# Patient Record
Sex: Male | Born: 1980 | Race: White | Hispanic: No | Smoking: Current every day smoker
Health system: Southern US, Community
[De-identification: ages and names within clinical notes are randomized; demographics above are authoritative.]

## PROBLEM LIST (undated history)

## (undated) DIAGNOSIS — J329 Chronic sinusitis, unspecified: Secondary | ICD-10-CM

## (undated) DIAGNOSIS — B009 Herpesviral infection, unspecified: Secondary | ICD-10-CM

## (undated) DIAGNOSIS — Z8619 Personal history of other infectious and parasitic diseases: Secondary | ICD-10-CM

## (undated) DIAGNOSIS — F419 Anxiety disorder, unspecified: Secondary | ICD-10-CM

## (undated) DIAGNOSIS — B20 Human immunodeficiency virus [HIV] disease: Secondary | ICD-10-CM

## (undated) DIAGNOSIS — F1120 Opioid dependence, uncomplicated: Secondary | ICD-10-CM

## (undated) DIAGNOSIS — F4310 Post-traumatic stress disorder, unspecified: Secondary | ICD-10-CM

## (undated) DIAGNOSIS — R569 Unspecified convulsions: Secondary | ICD-10-CM

## (undated) DIAGNOSIS — M792 Neuralgia and neuritis, unspecified: Secondary | ICD-10-CM

## (undated) DIAGNOSIS — Z9109 Other allergy status, other than to drugs and biological substances: Secondary | ICD-10-CM

## (undated) DIAGNOSIS — I10 Essential (primary) hypertension: Secondary | ICD-10-CM

## (undated) DIAGNOSIS — A549 Gonococcal infection, unspecified: Secondary | ICD-10-CM

## (undated) DIAGNOSIS — F3173 Bipolar disorder, in partial remission, most recent episode manic: Secondary | ICD-10-CM

## (undated) HISTORY — DX: Personal history of other infectious and parasitic diseases: Z86.19

## (undated) HISTORY — PX: CYST EXCISION: SHX5701

## (undated) HISTORY — DX: Bipolar disorder, in partial remission, most recent episode manic: F31.73

## (undated) HISTORY — DX: Chronic sinusitis, unspecified: J32.9

## (undated) HISTORY — DX: Post-traumatic stress disorder, unspecified: F43.10

## (undated) HISTORY — DX: Human immunodeficiency virus (HIV) disease: B20

---
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
## 2003-04-02 ENCOUNTER — Emergency Department (HOSPITAL_COMMUNITY): Admission: EM | Admit: 2003-04-02 | Discharge: 2003-04-02 | Payer: Self-pay | Admitting: Emergency Medicine

## 2003-04-06 ENCOUNTER — Emergency Department (HOSPITAL_COMMUNITY): Admission: EM | Admit: 2003-04-06 | Discharge: 2003-04-06 | Payer: Self-pay | Admitting: Emergency Medicine

## 2003-04-06 ENCOUNTER — Encounter: Payer: Self-pay | Admitting: Emergency Medicine

## 2003-11-09 ENCOUNTER — Emergency Department (HOSPITAL_COMMUNITY): Admission: AD | Admit: 2003-11-09 | Discharge: 2003-11-09 | Payer: Self-pay | Admitting: Family Medicine

## 2004-09-25 ENCOUNTER — Emergency Department (HOSPITAL_COMMUNITY): Admission: EM | Admit: 2004-09-25 | Discharge: 2004-09-25 | Payer: Self-pay | Admitting: Emergency Medicine

## 2005-08-15 ENCOUNTER — Emergency Department (HOSPITAL_COMMUNITY): Admission: EM | Admit: 2005-08-15 | Discharge: 2005-08-16 | Payer: Self-pay | Admitting: Emergency Medicine

## 2005-10-08 ENCOUNTER — Emergency Department (HOSPITAL_COMMUNITY): Admission: EM | Admit: 2005-10-08 | Discharge: 2005-10-08 | Payer: Self-pay | Admitting: Emergency Medicine

## 2005-10-25 ENCOUNTER — Emergency Department (HOSPITAL_COMMUNITY): Admission: EM | Admit: 2005-10-25 | Discharge: 2005-10-25 | Payer: Self-pay | Admitting: Emergency Medicine

## 2006-01-23 ENCOUNTER — Emergency Department (HOSPITAL_COMMUNITY): Admission: EM | Admit: 2006-01-23 | Discharge: 2006-01-23 | Payer: Self-pay | Admitting: Emergency Medicine

## 2006-12-06 ENCOUNTER — Emergency Department (HOSPITAL_COMMUNITY): Admission: EM | Admit: 2006-12-06 | Discharge: 2006-12-06 | Payer: Self-pay | Admitting: Emergency Medicine

## 2006-12-11 HISTORY — PX: ESOPHAGOGASTRODUODENOSCOPY: SHX1529

## 2006-12-24 ENCOUNTER — Ambulatory Visit (HOSPITAL_COMMUNITY): Admission: RE | Admit: 2006-12-24 | Discharge: 2006-12-24 | Payer: Self-pay | Admitting: Family Medicine

## 2006-12-31 ENCOUNTER — Ambulatory Visit (HOSPITAL_COMMUNITY): Admission: RE | Admit: 2006-12-31 | Discharge: 2006-12-31 | Payer: Self-pay | Admitting: Family Medicine

## 2007-01-30 ENCOUNTER — Ambulatory Visit: Payer: Self-pay | Admitting: Gastroenterology

## 2007-02-06 ENCOUNTER — Encounter (INDEPENDENT_AMBULATORY_CARE_PROVIDER_SITE_OTHER): Payer: Self-pay | Admitting: Specialist

## 2007-02-06 ENCOUNTER — Ambulatory Visit: Payer: Self-pay | Admitting: Gastroenterology

## 2007-02-06 ENCOUNTER — Ambulatory Visit (HOSPITAL_COMMUNITY): Admission: RE | Admit: 2007-02-06 | Discharge: 2007-02-06 | Payer: Self-pay | Admitting: Gastroenterology

## 2007-03-31 ENCOUNTER — Inpatient Hospital Stay (HOSPITAL_COMMUNITY): Admission: EM | Admit: 2007-03-31 | Discharge: 2007-04-01 | Payer: Self-pay | Admitting: Emergency Medicine

## 2007-05-01 ENCOUNTER — Emergency Department (HOSPITAL_COMMUNITY): Admission: EM | Admit: 2007-05-01 | Discharge: 2007-05-02 | Payer: Self-pay | Admitting: Emergency Medicine

## 2011-01-03 ENCOUNTER — Ambulatory Visit: Admit: 2011-01-03 | Payer: Self-pay | Admitting: Gastroenterology

## 2011-04-28 NOTE — H&P (Signed)
NAME:  OTHAL, KUBITZ             ACCOUNT NO.:  [id number]   MEDICAL RECORD NO.:  [id number]          PATIENT TYPE:  INP   LOCATION:  3731                         FACILITY:  [facility]   PHYSICIAN:  Hettie Holstein, D.O.    DATE OF BIRTH:  03/04/1981   DATE OF ADMISSION:  03/31/2007  DATE OF DISCHARGE:                              HISTORY & PHYSICAL   ADDENDUM:  In addition, it is felt that perhaps [name]'s symptoms  may be attributable to his multiple pain and anxiolytic medications, as  well as Soma, although he states he has been on these for long periods  of time and does not attribute these to CNS depression that may in part  explain these symptoms.  Will consider tapering his Tresa Garter, as this  medication has the potential for causing these symptoms.      Hettie Holstein, D.O.  Electronically Signed     ESS/MEDQ  D:  03/31/2007  T:  03/31/2007  Job:  [id number]   cc:   Patrica [name], M.D.
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
## 2011-04-28 NOTE — Consult Note (Signed)
NAME:  ARHAN, MCMANAMON             ACCOUNT NO.:  [id number]   MEDICAL RECORD NO.:  [id number]          PATIENT TYPE:  AMB   LOCATION:                                FACILITY:  [facility]   PHYSICIAN:  Kassie Mends, M.D.      DATE OF BIRTH:  1981-08-20   DATE OF CONSULTATION:  01/30/2007  DATE OF DISCHARGE:                                 CONSULTATION   REFERRING PHYSICIAN:  Patrica [name], MD.   REASON FOR CONSULTATION:  Diarrhea, abdominal cramps and vomiting.   HISTORY OF PRESENT ILLNESS:  [name] is a 30 year old male who  complains of vomiting every morning for years.  He has had diarrhea for  3 months.  He has had abdominal pain for 3 months.  He denies any known  triggers.  He denies any travel or antibiotics.  He states that Dr.  Nobie Putnam asked him for stool samples but he was unable to give him any  because the collection of the stool samples made him vomit.  He does  have well water but he does not drink it because it gives him heartburn.  He has heartburn and indigestion on a daily basis multiple times a day.  He wakes up 3-4 times a week at night time with indigestion.  When he  vomits, he vomits clear liquid.  It is not food.  He states that on a  good day he has 2 bowel movements a day.  On a bad day, he has 5 bowel  movements a day.  His number of bowel movements is influenced by the  number of times he eats.  His bowel movements are the consistency of  sausage like to soft blobs to fluffy pieces to watery.  The majority of  his stools, when he does have liquid stool, are watery.  He can go up to  4 times in 1 hour when he gets out of bed.  When he eats, he will have a  bowel movement approximately 30 minutes later.  He states food goes  right through him since the age of 69.  He denies having any preceding  viral illnesses.  No particular foods trigger his symptoms.  He denies  any consumption of soda.  He does drink 36 ounces of Splenda sweet tea  every day.  Denies any  consumption of sugar-free candy or gum.  He does  not eat fat-free chips.  Does not drink flavored water.  He sees blood  in his stool once a month.  He does have anal sex.  His last anal  intercourse was approximately 4-5 months ago.  He is under a lot of  stress.   His abdominal pain is every day.  It is a soreness, crampy and shooting  pain that involves his entire abdomen.  It does wake him up in the  middle of the night.  It eases up throughout the day.  The Bentyl helps.  Hydrocodone helps.  He states he would not have solid stools if it were  not for the Bentyl.  His pain is in the front of  his abdomen.  He feels  hot but has not had any documented fevers.  His abdominal pain does not  keep him awake at night.   PAST MEDICAL HISTORY:  1. Genital herpes.  2. Anxiety.  3. Degenerative disc disease in his neck.  4. Degenerative disc disease in his back.   PAST SURGICAL HISTORY:  Left eye surgery.   ALLERGIES:  No known drug allergies.   MEDICATIONS:  1. Bentyl 10 mg q.i.d.  2. Xanax 0.5 mg q.i.d.  3. Soma 325 mg q.i.d.  4. Hydrocodone 5/500 q.i.d.  5. The patient denies any over-the-counter medicines or herbal      supplements.   FAMILY HISTORY:  He does not know his father's side of the family  history.  He denies any known history of colon cancer or colon polyps.  He has four sisters who are younger than he is.   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  He smokes a  half a pack of cigarettes a day.  He occasionally has a mixed drink.   REVIEW OF SYSTEMS:  He denies any recreational drug use.  His review of  systems per the HPI.  Otherwise all systems are negative.   PHYSICAL EXAMINATION:  Weight 145 pounds.  Height 5 foot 10 inches.  BMI  20.8 (healthy).  Temperature 98.3, blood pressure 120/70, pulse 84.  GENERAL:  He is in no apparent distress.  Alert and oriented x4.  HEENT:  Normocephalic and atraumatic.  Pupils are equal, round, and reactive  to  light.  Mouth has no oral lesions.  Posterior pharynx without erythema  or exudate.  His neck has full range of motion.  No lymphadenopathy.  LUNGS:  Clear to auscultation bilaterally. CARDIOVASCULAR:  Exam has a  regular rhythm.  No murmur.  Normal S1-S2.  ABDOMEN:  Bowel sounds are  present.  Soft, nondistended.  Mild tenderness to palpation in the right  upper quadrant, left lower quadrant with moderate tenderness to  palpation in the left upper quadrant and right lower quadrant without  rebound or guarding.  Unable to appreciate hepatosplenomegaly.  No  abdominal bruits.  No pulsatile masses.  EXTREMITIES:  Without clubbing,  cyanosis, or edema.  NEUROLOGICAL:  He has no focal neurologic deficits.   ASSESSMENT:  [name] is a 30 year old male with early morning  vomiting and daily persistent heartburn and indigestion which is likely  secondary to untreated gastroesophageal reflux disease.  His abdominal  pain and diarrhea without weight loss is likely secondary to a  functional gut disorder.  The differential diagnosis includes celiac  sprue and a low likelihood of Giardia, Cryptosporidia or inflammatory  bowel disease.   Thank you for allowing me to see [name] in consultation.  My  recommendations to follow.   RECOMMENDATIONS:  1. I have given [name] a prescription for Prevacid and asked him      to take it 30 minutes before bedtime.  He is asked to continue the      Bentyl.  2. He is asked to begin a lactose-free diet.  He was given a handout      on a lactose-free diet.  3. I will schedule an EGD and will biopsy for celiac sprue.  Once his      biopsy returns, if they are not positive, then I will begin a fiber      supplement and a high fiber diet.  4. I will also draw an  HIV antibody and a CBC with differential.  I      will also obtain labs drawn from Dr. Geanie Logan office within the      last month. 5. He has a return patient visit to see me in 6  weeks.      Kassie Mends, M.D.  Electronically Signed     SM/MEDQ  D:  01/30/2007  T:  01/31/2007  Job:  [id number]   cc:   Patrica [name], M.D.  Fax: [phone]

## 2011-04-28 NOTE — Op Note (Signed)
NAME:  Randy Castillo, Randy Castillo             ACCOUNT NO.:  [id number]   MEDICAL RECORD NO.:  [id number]          PATIENT TYPE:  AMB   LOCATION:  DAY                           FACILITY:  [facility]   PHYSICIAN:  Kassie Mends, M.D.      DATE OF BIRTH:  1981/02/20   DATE OF PROCEDURE:  02/06/2007  DATE OF DISCHARGE:                                PROCEDURE NOTE   PROCEDURE:  Esophagogastroduodenoscopy with cold forceps biopsy.   INDICATION FOR EXAM:  [name] is a 30 year old male with early  morning vomiting and chronic abdominal pain and watery stool.   FINDINGS:  1. Erosive esophagitis without ulceration in the distal esophagus.  No      stricture or mass.  2. Patchy erythema in the body and antrum of the stomach consistent      with gastritis.  Biopsies obtained to evaluate for H. pylori      infection and eosinophilic gastritis.  3. Normal duodenal bulb in the second portion of the duodenum.      Biopsies obtained to evaluate for celiac sprue.   RECOMMENDATIONS:  1. Resume previous diet but avoid gastric irritants.  The patient was      given a handout on gastric irritants, reflux, and gastritis.  2. I will call Randy Castillo with the results of his biopsy.  He should      continue his Prevacid once daily.  He has a followup appointment to      see me in 4-6 weeks.  3. He is to avoid aspirin and anti-inflammatory drugs for the next 14      days.   MEDICATIONS:  1. Demerol 150 mg IV.  2. Versed 10 mg IV.  3. Phenergan 50 mg IV.   PROCEDURE TECHNIQUE:  Physical exam was performed and informed consent  was obtained from the patient after explaining the benefits, risks, and  alternatives to the procedure.  The patient was connected to the monitor  and placed in the left lateral position.  Continuous oxygen was provided  by nasal cannula and IV medicine administered through and indwelling  cannula.  After administration of sedation, the patient's esophagus was  intubated and the  scope was  advanced under direct visualization to the second portion of  the duodenum.  The scope was subsequently moved slowly back with careful  exam of the color, texture, anatomy and integrity of the mucosa on the  way out.  The patient was recovered in the Endoscopy Suite and  discharged to home in satisfactory condition.      Kassie Mends, M.D.  Electronically Signed     SM/MEDQ  D:  02/06/2007  T:  02/06/2007  Job:  [id number]   cc:   Patrica [name], M.D.  Fax: [phone]

## 2011-08-17 ENCOUNTER — Other Ambulatory Visit (HOSPITAL_COMMUNITY): Payer: Self-pay | Admitting: Family Medicine

## 2011-08-18 ENCOUNTER — Ambulatory Visit (HOSPITAL_COMMUNITY)
Admission: RE | Admit: 2011-08-18 | Discharge: 2011-08-18 | Disposition: A | Discharge status: Home / Self Care | Payer: Self-pay | Source: Ambulatory Visit | Attending: Family Medicine | Admitting: Family Medicine

## 2011-08-18 DIAGNOSIS — R109 Unspecified abdominal pain: Secondary | ICD-10-CM | POA: Insufficient documentation

## 2011-09-02 ENCOUNTER — Emergency Department (HOSPITAL_COMMUNITY)
Admission: EM | Admit: 2011-09-02 | Discharge: 2011-09-02 | Disposition: A | Discharge status: Home / Self Care | Payer: Medicaid Other | Attending: Emergency Medicine | Admitting: Emergency Medicine

## 2011-09-02 DIAGNOSIS — J45909 Unspecified asthma, uncomplicated: Secondary | ICD-10-CM | POA: Insufficient documentation

## 2011-09-02 DIAGNOSIS — IMO0002 Reserved for concepts with insufficient information to code with codable children: Secondary | ICD-10-CM | POA: Insufficient documentation

## 2011-09-02 DIAGNOSIS — M792 Neuralgia and neuritis, unspecified: Secondary | ICD-10-CM

## 2011-09-02 DIAGNOSIS — B009 Herpesviral infection, unspecified: Secondary | ICD-10-CM | POA: Insufficient documentation

## 2011-09-02 DIAGNOSIS — F17200 Nicotine dependence, unspecified, uncomplicated: Secondary | ICD-10-CM | POA: Insufficient documentation

## 2011-09-02 HISTORY — DX: Gonococcal infection, unspecified: A54.9

## 2011-09-02 HISTORY — DX: Herpesviral infection, unspecified: B00.9

## 2011-09-02 NOTE — ED Notes (Signed)
Pt presents with low back/spinal pain that radiates down bilateral legs. Pt states he also feels pain in Quads and states he feels a tingling pain in arms, head, and "scalp". Pt states he cant sleep or work and is under the care of NP at the health dept.

## 2011-09-02 NOTE — ED Provider Notes (Signed)
History     CSN: [id number] Arrival date & time: 09/02/2011  3:11 PM  Chief Complaint  Patient presents with  . Back Pain  . Leg Pain    HPI  (Consider location/radiation/quality/duration/timing/severity/associated sxs/prior treatment)  Patient is a 30 y.o. male presenting with back pain and leg pain. The history is provided by the patient.  Back Pain  This is a chronic problem. The problem occurs daily. The pain is associated with no known injury (hx of post herpetic neuralgia). The pain is present in the lumbar spine. Quality: Dull and constant. The pain radiates to the left thigh and right thigh. The pain is severe. The symptoms are aggravated by certain positions. Stiffness is present all day. Associated symptoms include numbness, abdominal pain and leg pain. Pertinent negatives include no chest pain, no fever, no perianal numbness, no bladder incontinence and no dysuria. Associated symptoms comments: Tingling of the hands and arms for 1 month.. He has tried nothing for the symptoms.  Leg Pain  Associated symptoms include numbness.    Past Medical History  Diagnosis Date  . Herpes   . Gonorrhea in male   . Asthma     History reviewed. No pertinent past surgical history.  History reviewed. No pertinent family history.  History  Substance Use Topics  . Smoking status: Current Everyday Smoker -- 1.0 packs/day  . Smokeless tobacco: Not on file  . Alcohol Use: No      Review of Systems  Review of Systems  Constitutional: Negative for fever and activity change.       All ROS Neg except as noted in HPI  HENT: Negative for nosebleeds and neck pain.   Eyes: Negative for photophobia and discharge.  Respiratory: Negative for cough, shortness of breath and wheezing.   Cardiovascular: Negative for chest pain and palpitations.  Gastrointestinal: Positive for abdominal pain. Negative for blood in stool.  Genitourinary: Negative for bladder incontinence, dysuria, frequency and  hematuria.  Musculoskeletal: Positive for back pain. Negative for arthralgias.  Skin: Negative.   Neurological: Positive for numbness. Negative for dizziness, seizures and speech difficulty.  Psychiatric/Behavioral: Negative for hallucinations and confusion.    Allergies  Darvocet  Home Medications   Current Outpatient Rx  Name Route Sig Dispense Refill  . ACYCLOVIR 400 MG PO TABS Oral Take 400 mg by mouth 2 (two) times daily.      . AMPHETAMINE-DEXTROAMPHETAMINE 15 MG PO TABS Oral Take 15 mg by mouth 2 (two) times daily.      Marland Kitchen GABAPENTIN 300 MG PO CAPS Oral Take 300 mg by mouth 3 (three) times daily.        Physical Exam    BP 128/89  Pulse 61  Temp(Src) 98.4 F (36.9 C) (Oral)  Resp 20  Ht 5\' 10"  (1.778 m)  Wt 145 lb (65.772 kg)  BMI 20.81 kg/m2  SpO2 100%  Physical Exam  Nursing note and vitals reviewed. Constitutional: He is oriented to person, place, and time. He appears well-developed and well-nourished.  Non-toxic appearance.  HENT:  Head: Normocephalic.  Right Ear: Tympanic membrane and external ear normal.  Left Ear: Tympanic membrane and external ear normal.  Eyes: EOM and lids are normal. Pupils are equal, round, and reactive to light.  Neck: Normal range of motion. Neck supple. Carotid bruit is not present.  Cardiovascular: Normal rate, regular rhythm, normal heart sounds, intact distal pulses and normal pulses.   Pulmonary/Chest: Breath sounds normal. No respiratory distress.  Abdominal: Soft. Bowel sounds are  normal. There is no tenderness. There is no guarding.  Musculoskeletal: Normal range of motion.  Lymphadenopathy:       Head (right side): No submandibular adenopathy present.       Head (left side): No submandibular adenopathy present.    He has no cervical adenopathy.  Neurological: He is alert and oriented to person, place, and time. He has normal strength. No cranial nerve deficit or sensory deficit. He exhibits normal muscle tone. Coordination  normal.       Pain to palpation of the upper extremities and the lower ext.(not new). No gross neuro deficits.   Skin: Skin is warm and dry.  Psychiatric: His speech is normal.    ED Course: Discussed the course of this chronic illness. Suggested he discuss this with one of the clinics and [facility] or [facility].  Procedures (including critical care time)  Labs Reviewed - No data to display US Abdomen Complete  08/18/2011  *RADIOLOGY REPORT*  Clinical Data:  Abdominal pain  COMPLETE ABDOMINAL ULTRASOUND  Comparison:  None.  Findings:  Gallbladder:  No gallstones, gallbladder wall thickening, or pericholecystic fluid.  Common bile duct:  Normal at 4 mm  Liver:  There is small echogenic focus measuring 6 mm within the right hepatic lobe.  This is most likely a small hemangioma.  IVC:  Appears normal.  Pancreas:  No focal abnormality seen.  Spleen:  Normal size and echogenicity.  Right Kidney:  10.6cm in length.  No evidence of hydronephrosis or stones.  Left Kidney:  10.9cm in length.  No evidence of hydronephrosis or stones.  Abdominal aorta:  No aneurysm identified.  IMPRESSION:  1.  No acute findings by ultrasound.  No explanation for abdominal pain. 2.  Normal gallbladder. 3.  Small echogenic focus within the liver is incidental finding. This likely represents a hemangioma.  In the absence of hepatocellur disease, know malignancy and with normal liver function, no specific follow-up is recommended.  If any risk factors are present, consider MRI abdomen with contrast.  Original Report Authenticated By: Genevive Bi, M.D.     Dx: Neuralgia of upper and lower extremities  MDM I have reviewed nursing notes, vital signs, and all appropriate lab and imaging results for this patient.        Kathie Dike, [place] 09/02/11 [phone]

## 2011-09-04 NOTE — ED Provider Notes (Signed)
Medical screening examination/treatment/procedure(s) were performed by non-physician practitioner and as supervising physician I was immediately available for consultation/collaboration.   Benny Lennert, MD 09/04/11 1435

## 2011-09-29 DIAGNOSIS — R202 Paresthesia of skin: Secondary | ICD-10-CM | POA: Insufficient documentation

## 2011-10-30 DIAGNOSIS — B009 Herpesviral infection, unspecified: Secondary | ICD-10-CM | POA: Insufficient documentation

## 2012-03-18 ENCOUNTER — Other Ambulatory Visit: Payer: Self-pay

## 2012-03-18 ENCOUNTER — Emergency Department (HOSPITAL_COMMUNITY): Payer: Medicaid Other

## 2012-03-18 ENCOUNTER — Encounter (HOSPITAL_COMMUNITY): Payer: Self-pay | Admitting: *Deleted

## 2012-03-18 ENCOUNTER — Emergency Department (HOSPITAL_COMMUNITY)
Admission: EM | Admit: 2012-03-18 | Discharge: 2012-03-18 | Disposition: A | Discharge status: Home / Self Care | Payer: Medicaid Other | Attending: Emergency Medicine | Admitting: Emergency Medicine

## 2012-03-18 DIAGNOSIS — R079 Chest pain, unspecified: Secondary | ICD-10-CM | POA: Insufficient documentation

## 2012-03-18 DIAGNOSIS — R6883 Chills (without fever): Secondary | ICD-10-CM | POA: Insufficient documentation

## 2012-03-18 DIAGNOSIS — F329 Major depressive disorder, single episode, unspecified: Secondary | ICD-10-CM | POA: Insufficient documentation

## 2012-03-18 DIAGNOSIS — I10 Essential (primary) hypertension: Secondary | ICD-10-CM | POA: Insufficient documentation

## 2012-03-18 DIAGNOSIS — J45909 Unspecified asthma, uncomplicated: Secondary | ICD-10-CM | POA: Insufficient documentation

## 2012-03-18 DIAGNOSIS — Z79899 Other long term (current) drug therapy: Secondary | ICD-10-CM | POA: Insufficient documentation

## 2012-03-18 DIAGNOSIS — M549 Dorsalgia, unspecified: Secondary | ICD-10-CM | POA: Insufficient documentation

## 2012-03-18 DIAGNOSIS — M79609 Pain in unspecified limb: Secondary | ICD-10-CM | POA: Insufficient documentation

## 2012-03-18 DIAGNOSIS — R0602 Shortness of breath: Secondary | ICD-10-CM | POA: Insufficient documentation

## 2012-03-18 DIAGNOSIS — R51 Headache: Secondary | ICD-10-CM | POA: Insufficient documentation

## 2012-03-18 DIAGNOSIS — F3289 Other specified depressive episodes: Secondary | ICD-10-CM | POA: Insufficient documentation

## 2012-03-18 DIAGNOSIS — G40909 Epilepsy, unspecified, not intractable, without status epilepticus: Secondary | ICD-10-CM | POA: Insufficient documentation

## 2012-03-18 DIAGNOSIS — R002 Palpitations: Secondary | ICD-10-CM | POA: Insufficient documentation

## 2012-03-18 HISTORY — DX: Unspecified convulsions: R56.9

## 2012-03-18 HISTORY — DX: Other allergy status, other than to drugs and biological substances: Z91.09

## 2012-03-18 HISTORY — DX: Essential (primary) hypertension: I10

## 2012-03-18 LAB — CBC
Hemoglobin: 15.8 g/dL (ref 13.0–17.0)
MCHC: 34.6 g/dL (ref 30.0–36.0)
Platelets: 259 10*3/uL (ref 150–400)
RBC: 5.07 MIL/uL (ref 4.22–5.81)

## 2012-03-18 LAB — TROPONIN I: Troponin I: <0.3 ng/mL (ref <0.30)

## 2012-03-18 LAB — BASIC METABOLIC PANEL
BUN: 17 mg/dL (ref 6–23)
Chloride: 101 mEq/L (ref 96–112)
GFR calc Af Amer: >90 mL/min (ref >90)
Potassium: 3.2 mEq/L — ABNORMAL LOW (ref 3.5–5.1)
Sodium: 137 mEq/L (ref 135–145)

## 2012-03-18 LAB — D-DIMER, QUANTITATIVE: D-Dimer, Quant: <0.22 ug/mL-FEU (ref 0.00–0.48)

## 2012-03-18 LAB — DIFFERENTIAL
Basophils Relative: 0 % (ref 0–1)
Monocytes Relative: 6 % (ref 3–12)
Neutro Abs: 4.7 10*3/uL (ref 1.7–7.7)
Neutrophils Relative %: 56 % (ref 43–77)

## 2012-03-18 MED ORDER — HYDROMORPHONE HCL PF 1 MG/ML IJ SOLN
1.0000 mg | Freq: Once | INTRAMUSCULAR | Status: AC
  Administered 2012-03-18: 1 mg via INTRAVENOUS
  Filled 2012-03-18: qty 1

## 2012-03-18 MED ORDER — SODIUM CHLORIDE 0.9 % IV SOLN: INTRAVENOUS | Status: DC

## 2012-03-18 MED ORDER — SODIUM CHLORIDE 0.9 % IV BOLUS (SEPSIS)
250.0000 mL | Freq: Once | INTRAVENOUS | Status: AC
  Administered 2012-03-18: 250 mL via INTRAVENOUS

## 2012-03-18 MED ORDER — ONDANSETRON HCL 4 MG/2ML IJ SOLN
4.0000 mg | Freq: Once | INTRAMUSCULAR | Status: AC
  Administered 2012-03-18: 4 mg via INTRAVENOUS
  Filled 2012-03-18: qty 2

## 2012-03-18 NOTE — ED Provider Notes (Addendum)
History    This chart was scribed for Randy Jakes, MD, MD by Smitty Pluck. The patient was seen in room APA05 and the patient's care was started at 2:28PM.   CSN: [id number]  Arrival date & time 03/18/12  1245   First MD Initiated Contact with Patient 03/18/12 1308      Chief Complaint  Patient presents with  . Chest Pain    (Consider location/radiation/quality/duration/timing/severity/associated sxs/prior treatment) Patient is a 31 y.o. male presenting with chest pain. The history is provided by the patient.  Chest Pain    Randy Castillo is a 31 y.o. male who presents to the Emergency Department complaining of moderate substernal chest pain onset 5 days ago. Pain is rated 7/10 currently. He describes the pain as "pressure." Denies nausea and vomiting. Reports having SOB. He reports having chills. Denies fever, sore throat, dysuria, rash and swelling in legs. He always back pain.  Pain is constant since onset with radiation to arms, back and head. Pt reports having HTN and hx of elevated heart rate. Last time he say PCP was 3 weeks ago and they have discussed further cardiology studies. He has hx of anxiety and he has recently moved to new home.  PCP Dr. Venetia Night    Past Medical History  Diagnosis Date  . Herpes   . Gonorrhea in male   . Asthma   . Environmental allergies   . Depression   . Migraine   . Hypertension   . Seizures     History reviewed. No pertinent past surgical history.  History reviewed. No pertinent family history.  History  Substance Use Topics  . Smoking status: Current Everyday Smoker -- 1.0 packs/day  . Smokeless tobacco: Not on file  . Alcohol Use: No      Review of Systems  Cardiovascular: Positive for chest pain.  All other systems reviewed and are negative.   10 Systems reviewed and are negative for acute change except as noted in the HPI.  Allergies  Darvocet  Home Medications   Current Outpatient Rx  Name Route Sig  Dispense Refill  . ACYCLOVIR 400 MG PO TABS Oral Take 400 mg by mouth 3 (three) times daily.     . BUPROPION HCL ER (XL) 300 MG PO TB24 Oral Take 300 mg by mouth 2 (two) times daily.    Marland Kitchen BUTALBITAL-APAP-CAFFEINE 50-325-40 MG PO TABS Oral Take 2 tablets by mouth daily as needed. migraines    . GABAPENTIN 300 MG PO CAPS Oral Take 600 mg by mouth 3 (three) times daily.    Marland Kitchen HYDROCHLOROTHIAZIDE 25 MG PO TABS Oral Take 12.5 mg by mouth daily.    Marland Kitchen OMEPRAZOLE 20 MG PO CPDR Oral Take 20 mg by mouth 2 (two) times daily.    . TOPIRAMATE 100 MG PO TABS Oral Take 100 mg by mouth 2 (two) times daily.      BP 128/84  Pulse 110  Temp(Src) 98.8 F (37.1 C) (Oral)  Resp 20  Ht 5\' 10"  (1.778 m)  Wt 155 lb (70.308 kg)  BMI 22.24 kg/m2  SpO2 100%  Physical Exam  Nursing note and vitals reviewed. Constitutional: He is oriented to person, place, and time. He appears well-developed and well-nourished. No distress.  HENT:  Head: Normocephalic and atraumatic.  Mouth/Throat: Oropharynx is clear and moist.  Eyes: Conjunctivae are normal. Pupils are equal, round, and reactive to light.  Cardiovascular: Normal rate, regular rhythm and normal heart sounds.   No murmur heard.  Pulmonary/Chest: Effort normal and breath sounds normal. No respiratory distress.  Abdominal: Soft. Bowel sounds are normal.  Musculoskeletal: Normal range of motion. He exhibits no edema.  Neurological: He is alert and oriented to person, place, and time. Coordination normal.  Psychiatric: He has a normal mood and affect. His behavior is normal.    ED Course  Procedures (including critical care time) DIAGNOSTIC STUDIES: Oxygen Saturation is 100% on room air, normal by my interpretation.    COORDINATION OF CARE:    Labs Reviewed  BASIC METABOLIC PANEL - Abnormal; Notable for the following:    Potassium 3.2 (*)    Glucose, Bld 111 (*)    All other components within normal limits  TROPONIN I  D-DIMER, QUANTITATIVE  CBC    DIFFERENTIAL   Dg Chest 2 View  03/18/2012  *RADIOLOGY REPORT*  Clinical Data: Chest pain  CHEST - 2 VIEW  Comparison:  03/31/2007  Findings:  The heart size and mediastinal contours are within normal limits.  Both lungs are clear.  The visualized skeletal structures are unremarkable.  IMPRESSION: No active cardiopulmonary disease.  Original Report Authenticated By: Judie Petit. Ruel Favors, M.D.   Results for orders placed during the hospital encounter of 03/18/12  TROPONIN I      Component Value Range   Troponin I <0.30  <0.30 (ng/mL)  D-DIMER, QUANTITATIVE      Component Value Range   D-Dimer, Quant <0.22  0.00 - 0.48 (ug/mL-FEU)  CBC      Component Value Range   WBC 8.4  4.0 - 10.5 (K/uL)   RBC 5.07  4.22 - 5.81 (MIL/uL)   Hemoglobin 15.8  13.0 - 17.0 (g/dL)   HCT 16.1  09.6 - 04.5 (%)   MCV 89.9  78.0 - 100.0 (fL)   MCH 31.2  26.0 - 34.0 (pg)   MCHC 34.6  30.0 - 36.0 (g/dL)   RDW 40.9  81.1 - 91.4 (%)   Platelets 259  150 - 400 (K/uL)  DIFFERENTIAL      Component Value Range   Neutrophils Relative 56  43 - 77 (%)   Neutro Abs 4.7  1.7 - 7.7 (K/uL)   Lymphocytes Relative 37  12 - 46 (%)   Lymphs Abs 3.2  0.7 - 4.0 (K/uL)   Monocytes Relative 6  3 - 12 (%)   Monocytes Absolute 0.5  0.1 - 1.0 (K/uL)   Eosinophils Relative 1  0 - 5 (%)   Eosinophils Absolute 0.1  0.0 - 0.7 (K/uL)   Basophils Relative 0  0 - 1 (%)   Basophils Absolute 0.0  0.0 - 0.1 (K/uL)  BASIC METABOLIC PANEL      Component Value Range   Sodium 137  135 - 145 (mEq/L)   Potassium 3.2 (*) 3.5 - 5.1 (mEq/L)   Chloride 101  96 - 112 (mEq/L)   CO2 25  19 - 32 (mEq/L)   Glucose, Bld 111 (*) 70 - 99 (mg/dL)   BUN 17  6 - 23 (mg/dL)   Creatinine, Ser 7.82  0.50 - 1.35 (mg/dL)   Calcium 9.8  8.4 - 95.6 (mg/dL)   GFR calc non Af Amer >90  >90 (mL/min)   GFR calc Af Amer >90  >90 (mL/min)    Date: 03/18/2012  Rate: 106  Rhythm: sinus tachycardia  QRS Axis: normal  Intervals: normal  ST/T Wave abnormalities:  nonspecific ST changes  Conduction Disutrbances:none  Narrative Interpretation:   Old EKG Reviewed: none available  1. Chest pain   2. Heart palpitations       MDM  Patient with chest pain for 5 days no evidence of acute cardiac event troponin normal EKG without acute changes. Also d-dimer screening negative no evidence of pulmonary embolism chest x-ray without any significant abnormalities no pneumonia no pneumothorax.  Patient also a history of palpitations and fast heart rate no evidence that occurring here while monitored in the emergency department patient will need followup with her primary care provider and recommend cardiology referral for Holter monitoring. Patient is safe and stable to return home.    I personally performed the services described in this documentation, which was scribed in my presence. The recorded information has been reviewed and considered.          Randy Jakes, MD 03/18/12 1719  Randy Jakes, MD 03/18/12 [phone]

## 2012-03-18 NOTE — Discharge Instructions (Signed)
Workup in the emergency room and negative for acute cardiac event for pneumonia or 4 evidence of pulmonary embolus. Call cardiology for appointment with palpitations. Follow up with your primary care doctor in the next few days. Return for new or worse symptoms return for heart palpitations lasting for more than 45 minutes. Start one baby aspirin a day.

## 2012-03-18 NOTE — ED Notes (Signed)
Went to assess pt and pt found to be tearful. Pt reports he is afraid and is tired of hurting. Partner at bedside. States headache has not improved. Will make MD aware.

## 2012-03-18 NOTE — ED Notes (Addendum)
Chest pain for 5 days with rapid heart rate..Feels dizzy at times.

## 2012-03-18 NOTE — ED Notes (Signed)
Patient transported to X-ray 

## 2012-03-18 NOTE — ED Notes (Signed)
Pt tearful. States his heart hurts. States it has been hurting x 5 days. States he is fine when he wakes up, but if he moves, gets anxious or does anything it starts hurting

## 2012-03-18 NOTE — ED Notes (Signed)
Pt waiting to be reeval and disposition 

## 2012-04-08 ENCOUNTER — Emergency Department (HOSPITAL_COMMUNITY): Payer: Medicaid Other

## 2012-04-08 ENCOUNTER — Emergency Department (HOSPITAL_COMMUNITY)
Admission: EM | Admit: 2012-04-08 | Discharge: 2012-04-09 | Disposition: A | Discharge status: Home / Self Care | Payer: Medicaid Other | Attending: Emergency Medicine | Admitting: Emergency Medicine

## 2012-04-08 ENCOUNTER — Encounter (HOSPITAL_COMMUNITY): Payer: Self-pay | Admitting: *Deleted

## 2012-04-08 DIAGNOSIS — IMO0001 Reserved for inherently not codable concepts without codable children: Secondary | ICD-10-CM | POA: Insufficient documentation

## 2012-04-08 DIAGNOSIS — R51 Headache: Secondary | ICD-10-CM | POA: Insufficient documentation

## 2012-04-08 DIAGNOSIS — R079 Chest pain, unspecified: Secondary | ICD-10-CM | POA: Insufficient documentation

## 2012-04-08 DIAGNOSIS — S161XXA Strain of muscle, fascia and tendon at neck level, initial encounter: Secondary | ICD-10-CM

## 2012-04-08 DIAGNOSIS — F329 Major depressive disorder, single episode, unspecified: Secondary | ICD-10-CM | POA: Insufficient documentation

## 2012-04-08 DIAGNOSIS — M545 Low back pain, unspecified: Secondary | ICD-10-CM | POA: Insufficient documentation

## 2012-04-08 DIAGNOSIS — I10 Essential (primary) hypertension: Secondary | ICD-10-CM | POA: Insufficient documentation

## 2012-04-08 DIAGNOSIS — S335XXA Sprain of ligaments of lumbar spine, initial encounter: Secondary | ICD-10-CM | POA: Insufficient documentation

## 2012-04-08 DIAGNOSIS — Z79899 Other long term (current) drug therapy: Secondary | ICD-10-CM | POA: Insufficient documentation

## 2012-04-08 DIAGNOSIS — G40909 Epilepsy, unspecified, not intractable, without status epilepticus: Secondary | ICD-10-CM | POA: Insufficient documentation

## 2012-04-08 DIAGNOSIS — J45909 Unspecified asthma, uncomplicated: Secondary | ICD-10-CM | POA: Insufficient documentation

## 2012-04-08 DIAGNOSIS — S139XXA Sprain of joints and ligaments of unspecified parts of neck, initial encounter: Secondary | ICD-10-CM | POA: Insufficient documentation

## 2012-04-08 DIAGNOSIS — F3289 Other specified depressive episodes: Secondary | ICD-10-CM | POA: Insufficient documentation

## 2012-04-08 DIAGNOSIS — R110 Nausea: Secondary | ICD-10-CM | POA: Insufficient documentation

## 2012-04-08 DIAGNOSIS — S39012A Strain of muscle, fascia and tendon of lower back, initial encounter: Secondary | ICD-10-CM

## 2012-04-08 DIAGNOSIS — M542 Cervicalgia: Secondary | ICD-10-CM | POA: Insufficient documentation

## 2012-04-08 MED ORDER — PROMETHAZINE HCL 12.5 MG PO TABS
25.0000 mg | ORAL_TABLET | Freq: Once | ORAL | Status: AC
  Administered 2012-04-08: 25 mg via ORAL
  Filled 2012-04-08: qty 2

## 2012-04-08 MED ORDER — ONDANSETRON 8 MG PO TBDP
8.0000 mg | ORAL_TABLET | Freq: Once | ORAL | Status: AC
  Administered 2012-04-08: 8 mg via ORAL
  Filled 2012-04-08: qty 1

## 2012-04-08 MED ORDER — OXYCODONE-ACETAMINOPHEN 5-325 MG PO TABS
1.0000 | ORAL_TABLET | Freq: Once | ORAL | Status: AC
  Administered 2012-04-08: 1 via ORAL
  Filled 2012-04-08: qty 1

## 2012-04-08 NOTE — ED Notes (Signed)
c-collar placed in triage
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
## 2012-04-09 MED ORDER — OXYCODONE-ACETAMINOPHEN 5-325 MG PO TABS: 1.0000 | ORAL_TABLET | ORAL | Status: AC | PRN

## 2012-04-09 MED ORDER — METHOCARBAMOL 500 MG PO TABS: ORAL_TABLET | ORAL | Status: DC

## 2012-04-09 MED ORDER — ONDANSETRON 8 MG PO TBDP: 8.0000 mg | ORAL_TABLET | Freq: Three times a day (TID) | ORAL | Status: AC | PRN
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
## 2012-04-13 NOTE — ED Provider Notes (Signed)
Medical screening examination/treatment/procedure(s) were performed by non-physician practitioner and as supervising physician I was immediately available for consultation/collaboration.  Chastidy Ranker S. Sim Choquette, MD 04/13/12 2318 

## 2012-04-21 ENCOUNTER — Emergency Department (HOSPITAL_COMMUNITY): Payer: Medicaid Other

## 2012-04-21 ENCOUNTER — Emergency Department (HOSPITAL_COMMUNITY)
Admission: EM | Admit: 2012-04-21 | Discharge: 2012-04-21 | Disposition: A | Discharge status: Home / Self Care | Payer: Medicaid Other | Attending: Emergency Medicine | Admitting: Emergency Medicine

## 2012-04-21 ENCOUNTER — Encounter (HOSPITAL_COMMUNITY): Payer: Self-pay | Admitting: *Deleted

## 2012-04-21 DIAGNOSIS — M25519 Pain in unspecified shoulder: Secondary | ICD-10-CM | POA: Insufficient documentation

## 2012-04-21 DIAGNOSIS — J45909 Unspecified asthma, uncomplicated: Secondary | ICD-10-CM | POA: Insufficient documentation

## 2012-04-21 DIAGNOSIS — S42109A Fracture of unspecified part of scapula, unspecified shoulder, initial encounter for closed fracture: Secondary | ICD-10-CM

## 2012-04-21 DIAGNOSIS — R079 Chest pain, unspecified: Secondary | ICD-10-CM | POA: Insufficient documentation

## 2012-04-21 DIAGNOSIS — S42153A Displaced fracture of neck of scapula, unspecified shoulder, initial encounter for closed fracture: Secondary | ICD-10-CM | POA: Insufficient documentation

## 2012-04-21 DIAGNOSIS — W19XXXA Unspecified fall, initial encounter: Secondary | ICD-10-CM | POA: Insufficient documentation

## 2012-04-21 DIAGNOSIS — S42143A Displaced fracture of glenoid cavity of scapula, unspecified shoulder, initial encounter for closed fracture: Secondary | ICD-10-CM | POA: Insufficient documentation

## 2012-04-21 DIAGNOSIS — I10 Essential (primary) hypertension: Secondary | ICD-10-CM | POA: Insufficient documentation

## 2012-04-21 HISTORY — DX: Anxiety disorder, unspecified: F41.9

## 2012-04-21 MED ORDER — OXYCODONE-ACETAMINOPHEN 5-325 MG PO TABS
1.0000 | ORAL_TABLET | Freq: Once | ORAL | Status: AC
  Administered 2012-04-21: 1 via ORAL
  Filled 2012-04-21: qty 1

## 2012-04-21 MED ORDER — OXYCODONE-ACETAMINOPHEN 5-325 MG PO TABS: 1.0000 | ORAL_TABLET | Freq: Three times a day (TID) | ORAL | Status: AC | PRN

## 2012-04-21 MED ORDER — MORPHINE SULFATE 4 MG/ML IJ SOLN
4.0000 mg | Freq: Once | INTRAMUSCULAR | Status: AC
  Administered 2012-04-21: 4 mg via INTRAMUSCULAR
  Filled 2012-04-21: qty 1

## 2012-04-21 NOTE — Discharge Instructions (Signed)
Scapular Fracture  You have a fracture (break in bone) of your scapula. This is your shoulder blade. It is the large flat bone behind your shoulder. This is also the bone that makes up the ball and socket joint of your shoulder. Most of the time surgery is not required for injuries to this bone unless the socket of the shoulder joint is involved.  DIAGNOSIS   Because of the severity of force usually required to break this bone, x-rays are often taken of other bones likely to be injured at the same time. X-rays of the hip, knee, and pelvis may be taken. Specialized x-rays (arteriograms) may be needed if there are injuries to large blood vessels associated with this injury.  HOME CARE INSTRUCTIONS    Simple fractures of the scapula can be treated with a sling and swathe type of immobilization. This means the involved area is held in place by putting the arm in a sling. A wrap is made around the upper arm with the sling holding the arm next to the chest. This may be removed for bathing as instructed by your caregiver.   Apply ice to the injury for 15 to 20 minutes 3 to 4 times per day. Put the ice in a plastic bag. Place a towel between the bag of ice and your skin, splint, or immobilization device.   Do not resume use until instructed by your caregiver. Usually full rehabilitation (exercises to improve the injury site) will begin sometime after the sling and swathe are removed. Then begin use gradually as directed. Do not increase use to the point of pain. If pain develops, decrease use and continue the above measures. Slowly increase activities that do not cause discomfort until you gradually achieve normal use without pain.   Only take over-the-counter or prescription medicines for pain, discomfort, or fever as directed by your caregiver.  SEEK IMMEDIATE MEDICAL CARE IF:    Your pain and swelling increase and is uncontrolled with medications.   You develop new, unexplained symptoms or an increase of the  symptoms which brought you to your caregiver.   You develop shortness or breath or cough up blood.   You are unable to move your arm or fingers. You develop warmth and swelling in your affected arm.   You develop an unexplained temperature.  Document Released: 11/27/2005 Document Revised: 11/16/2011 Document Reviewed: 10/19/2006  [employer] Patient Information 2012 [employer].

## 2012-04-21 NOTE — ED Notes (Signed)
Pt c/o pain in his right shoulder, right arm and right rib cage. Pt alert and oriented x 3. Speech slurred. Able to wiggle fingers right hand and moderate hand grip. Pt has swollen area on the back of his right shoulder. Abrasions noted to right shoulder.

## 2012-04-21 NOTE — ED Notes (Signed)
Patient slurring words upon assessment. Asked patient if he had taken any medications before arriving to ED. Patient states "I have recently been put on valium by my doctor and I have taken 2 of them today and they make me slur my words."

## 2012-04-21 NOTE — ED Provider Notes (Signed)
History     CSN: [id number]  Arrival date & time 04/21/12  1749   First MD Initiated Contact with Patient 04/21/12 1756      Chief Complaint  Patient presents with  . Fall     Patient is a 31 y.o. male presenting with fall. The history is provided by the patient.  Fall Incident onset: today. Distance fallen: standing. Point of impact: right shoulder. The pain is present in the right shoulder (right chest ). The pain is moderate. Pertinent negatives include no visual change, no abdominal pain, no vomiting, no loss of consciousness and no tingling. The symptoms are aggravated by activity. He has tried rest for the symptoms. The treatment provided no relief.  Pt reports long h/o neuropathy, which causes him to fall frequently He reports he fell today, landing on his right shoulder No head injury No neck pain No SOB Reports pain in right shoulder and right chest wall No abd pain No LOC, no dizziness   Past Medical History  Diagnosis Date  . Herpes   . Gonorrhea in male   . Asthma   . Environmental allergies   . Depression   . Migraine   . Hypertension   . Seizures   . Anxiety     History reviewed. No pertinent past surgical history.  History reviewed. No pertinent family history.  History  Substance Use Topics  . Smoking status: Current Everyday Smoker -- 1.0 packs/day  . Smokeless tobacco: Not on file  . Alcohol Use: No      Review of Systems  Gastrointestinal: Negative for vomiting and abdominal pain.  Neurological: Negative for tingling and loss of consciousness.  All other systems reviewed and are negative.    Allergies  Darvocet and Methocarbamol  Home Medications   Current Outpatient Rx  Name Route Sig Dispense Refill  . ARIPIPRAZOLE 10 MG PO TABS Oral Take 10 mg by mouth daily.    . BUPROPION HCL ER (XL) 300 MG PO TB24 Oral Take 300 mg by mouth daily.     Marland Kitchen BUTALBITAL-APAP-CAFFEINE 50-325-40 MG PO TABS Oral Take 2 tablets by mouth daily as  needed. migraines    . DIAZEPAM 10 MG PO TABS Oral Take 10 mg by mouth 4 (four) times daily.    Marland Kitchen GABAPENTIN 300 MG PO CAPS Oral Take 300 mg by mouth 3 (three) times daily.    Marland Kitchen HYDROCHLOROTHIAZIDE 25 MG PO TABS Oral Take 25 mg by mouth daily.     Marland Kitchen LORAZEPAM 0.5 MG PO TABS Oral Take 0.5 mg by mouth 3 (three) times daily as needed. neuropathy    . OMEPRAZOLE 20 MG PO CPDR Oral Take 20 mg by mouth 2 (two) times daily.    . OXYCODONE-ACETAMINOPHEN 10-325 MG PO TABS Oral Take 1 tablet by mouth 4 (four) times daily.    Marland Kitchen VALACYCLOVIR HCL 1 G PO TABS Oral Take 1,000 mg by mouth daily.      BP 112/75  Pulse 93  Temp(Src) 98.6 F (37 C) (Oral)  Resp 20  Ht 5\' 10"  (1.778 m)  Wt 130 lb (58.968 kg)  BMI 18.65 kg/m2  SpO2 98%  Physical Exam CONSTITUTIONAL: Well developed/well nourished HEAD AND FACE: Normocephalic/atraumatic EYES: EOMI/PERRL ENMT: Mucous membranes moist NECK: supple no meningeal signs SPINE:entire spine nontender, NEXUS criteria met, No bruising/crepitance/stepoffs noted to spine CV: S1/S2 noted, no murmurs/rubs/gallops noted Chest - tender to palpation. No crepitance LUNGS: Lungs are clear to auscultation bilaterally, no apparent distress ABDOMEN: soft, nontender,  no rebound or guarding GU:no cva tenderness NEURO: Pt is awake/alert, moves all extremitiesx4, GCS !5 EXTREMITIES: pulses normal, tenderness to palpation of right shoulder and right elbow Abrasions noted to right shoulder Tender over right scapula but no abrasions or laceration over scapula He is able to abduct the right shoulder and he is able to touch opposite shoulder with right hand All other extremities/joints palpated/ranged and nontender SKIN: warm, color normal PSYCH: no abnormalities of mood noted  ED Course  Procedures  D/w dr Hilda Lias who agrees to see patient in f/u tomorrow, place in sling/swathe No other signs of traumatic injury, distal n/v intact on right UE  The patient appears reasonably  screened and/or stabilized for discharge and I doubt any other medical condition or other [facility] requiring further screening, evaluation, or treatment in the ED at this time prior to discharge.     MDM  Nursing notes reviewed and considered in documentation xrays reviewed and considered         Joya Gaskins, MD 04/21/12 2029

## 2012-04-21 NOTE — ED Notes (Signed)
Pt fell from standing position. C/o pain in his right shoulder and ribs. Pt states that he has trouble walking due to neuropathy.

## 2012-05-15 ENCOUNTER — Ambulatory Visit (HOSPITAL_COMMUNITY): Payer: No Typology Code available for payment source | Admitting: Physical Therapy

## 2012-05-17 ENCOUNTER — Ambulatory Visit (HOSPITAL_COMMUNITY)
Admission: RE | Admit: 2012-05-17 | Discharge: 2012-05-17 | Disposition: A | Discharge status: Home / Self Care | Payer: Medicaid Other | Source: Ambulatory Visit | Attending: Orthopaedic Surgery | Admitting: Orthopaedic Surgery

## 2012-05-17 DIAGNOSIS — M6281 Muscle weakness (generalized): Secondary | ICD-10-CM | POA: Insufficient documentation

## 2012-05-17 DIAGNOSIS — S42109A Fracture of unspecified part of scapula, unspecified shoulder, initial encounter for closed fracture: Secondary | ICD-10-CM | POA: Insufficient documentation

## 2012-05-17 DIAGNOSIS — IMO0001 Reserved for inherently not codable concepts without codable children: Secondary | ICD-10-CM | POA: Insufficient documentation

## 2012-05-17 DIAGNOSIS — M25519 Pain in unspecified shoulder: Secondary | ICD-10-CM | POA: Insufficient documentation
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
## 2012-05-20 ENCOUNTER — Inpatient Hospital Stay (HOSPITAL_COMMUNITY)
Admission: RE | Admit: 2012-05-20 | Payer: No Typology Code available for payment source | Source: Ambulatory Visit | Admitting: Occupational Therapy

## 2012-05-27 ENCOUNTER — Ambulatory Visit (HOSPITAL_COMMUNITY): Payer: No Typology Code available for payment source | Admitting: Specialist

## 2012-05-29 ENCOUNTER — Ambulatory Visit (HOSPITAL_COMMUNITY)
Admission: RE | Admit: 2012-05-29 | Discharge: 2012-05-29 | Disposition: A | Discharge status: Home / Self Care | Payer: Medicaid Other | Source: Ambulatory Visit | Attending: Orthopaedic Surgery | Admitting: Orthopaedic Surgery

## 2012-05-29 DIAGNOSIS — M6281 Muscle weakness (generalized): Secondary | ICD-10-CM

## 2012-05-29 DIAGNOSIS — S42109A Fracture of unspecified part of scapula, unspecified shoulder, initial encounter for closed fracture: Secondary | ICD-10-CM

## 2012-05-29 DIAGNOSIS — M25519 Pain in unspecified shoulder: Secondary | ICD-10-CM

## 2012-05-29 NOTE — Progress Notes (Signed)
Occupational Therapy Treatment Patient Details  Name: Randy Castillo MRN: [id number] Date of Birth: 18-May-1981  Today's Date: 05/29/2012 Time: 1521-1610 OT Time Calculation (min): 49 min Manual Therapy 321-342 21' Therapeutic Exercise 343-359 16' Ice 400-410 10'  Visit#: 2  of 6   Re-eval: 06/14/12 Assessment Diagnosis: Fracture of Right Scapula 04/21/12 Next MD Visit: 05/23/12  Authorization: Medicaid requested 3 visits. supplied patient with Lubertha Basque contact info  Authorization Time Period: through 06/14/12 taken at 05/17/12 1621   Authorization Visit#: 2  of 3   Subjective Symptoms/Limitations Symptoms: S:  It feels worse.  When I lie on it or any pressure it shoots pain to a 10+ Pain Assessment Currently in Pain?: Yes Pain Score:   9 Pain Location: Shoulder Pain Orientation: Right Pain Type: Acute pain  Precautions/Restrictions     Exercise/Treatments Supine Protraction: PROM;AAROM;10 reps Horizontal ABduction: PROM;AAROM;10 reps External Rotation: PROM;AAROM;10 reps Internal Rotation: PROM;AAROM;10 reps Flexion: PROM;AAROM;10 reps ABduction: PROM;AAROM;10 reps Seated Elevation: AROM;10 reps Extension: AROM;10 reps Row: AROM;10 reps Therapy Ball Flexion: 15 reps ABduction: 15 reps      Modalities Modalities: Cryotherapy Manual Therapy Manual Therapy: Myofascial release Myofascial Release: MFR and manual stretching to right upper arm and scapular region to decrease pain and increase mobility  Occupational Therapy Assessment and Plan OT Assessment and Plan Clinical Impression Statement: A:  Added AAROM, ball stretches, scapula ROM.  patient has full ROM but is very limited by pain.  Patient to contact the MD secondary to he feels pain is worse. OT Plan: P:  Add wall wash and thumbtacks.   Goals Short Term Goals Time to Complete Short Term Goals: 4 weeks Short Term Goal 1: Patient will be educated on HEP. Short Term Goal 2: Patient will  increase PROM in right shoulder to [facility] for increased ability to bathe independently. Short Term Goal 3: Patient will increase right shoulder strength to 3+/5 for increased ability to use gardening tools. Short Term Goal 4: Patient will decrease pain in right shoulder to 5/10 while attempting to sleep. Short Term Goal 5: Patient will decrease fascial restrictions to min-mod in his right shoulder region. Long Term Goals Time to Complete Long Term Goals: 8 weeks Long Term Goal 1: Patient will return to prior level of I with all B/IADLS, work, and leisure activities. Long Term Goal 2: Patient will increase right shoulder AROM to WNL for increased ability to reach into overhead cabinets and place hand on the steering wheel. Long Term Goal 3: Patient will increase right shoulder strength to 5/5 for increased ability to complete remodeling projects at home. Long Term Goal 4: Patient will decrease pain in his right shoulder to 2/10 in his right shoulder while completing overhead activities. Long Term Goal 5: Patient will decrease restrictions to trace in his right shoulder region.  Problem List Patient Active Problem List  Diagnosis  . Scapula fracture  . Pain in joint, shoulder region  . Muscle weakness (generalized)    End of Session Activity Tolerance: Patient tolerated treatment well General Behavior During Session: [facility] for tasks performed Cognition: [facility] for tasks performed  GO No functional reporting required   Stefanny Pieri L. Alven Alverio, COTA/L  05/29/2012, 4:41 PM

## 2012-06-05 ENCOUNTER — Inpatient Hospital Stay (HOSPITAL_COMMUNITY)
Admission: RE | Admit: 2012-06-05 | Payer: No Typology Code available for payment source | Source: Ambulatory Visit | Admitting: Occupational Therapy

## 2012-06-10 ENCOUNTER — Ambulatory Visit (HOSPITAL_COMMUNITY)
Admission: RE | Admit: 2012-06-10 | Discharge: 2012-06-10 | Disposition: A | Discharge status: Home / Self Care | Payer: No Typology Code available for payment source | Source: Ambulatory Visit | Attending: Family Medicine | Admitting: Family Medicine

## 2012-06-10 DIAGNOSIS — S42109A Fracture of unspecified part of scapula, unspecified shoulder, initial encounter for closed fracture: Secondary | ICD-10-CM

## 2012-06-10 DIAGNOSIS — M25519 Pain in unspecified shoulder: Secondary | ICD-10-CM | POA: Insufficient documentation

## 2012-06-10 DIAGNOSIS — IMO0001 Reserved for inherently not codable concepts without codable children: Secondary | ICD-10-CM | POA: Insufficient documentation

## 2012-06-10 DIAGNOSIS — M6281 Muscle weakness (generalized): Secondary | ICD-10-CM | POA: Insufficient documentation

## 2012-06-10 NOTE — Progress Notes (Signed)
Occupational Therapy Treatment Patient Details  Name: Randy Castillo MRN: [id number] Date of Birth: 02/23/1981  Today's Date: 06/10/2012 Time: 4098-1191 OT Time Calculation (min): 41 min Manual Therapy 403-426 23' Therapeutic Exercise 427-443   Visit#: 3  of 16   Re-eval: 06/14/12    Authorization: 3 medicaid visits then further visits approved by Luber[employer] Basque  Authorization Time Period: through 06/14/12   Authorization Visit#: 3  of 3   Subjective Symptoms/Limitations Symptoms: S:  I have been taking my pain meds so that is helping. Pain Assessment Currently in Pain?: No/denies Pain Score: 0-No pain Pain Location: Shoulder Pain Orientation: Right  Precautions/Restrictions     Exercise/Treatments Supine Protraction: PROM;AAROM;15 reps Horizontal ABduction: PROM;AAROM;15 reps External Rotation: PROM;AAROM;15 reps Internal Rotation: PROM;AAROM;15 reps Flexion: PROM;AAROM;15 reps ABduction: PROM;AAROM;15 reps Seated Elevation: AROM;10 reps Extension: AROM;10 reps Row: AROM;10 reps   Therapy Ball Flexion: 20 reps ABduction: 20 reps ROM / Strengthening / Isometric Strengthening Wall Wash: 2' Thumb Tacks: 1' Prot/Ret//Elev/Dep: 1'           Manual Therapy Manual Therapy: Myofascial release Myofascial Release: MFR and manual stretching to right upper arm and scapular region to decrease pain and increase mobility   Occupational Therapy Assessment and Plan OT Assessment and Plan Clinical Impression Statement: A:  Added wall wash, thumbtacks (which patient stated felt good to do) and pro/ret/elev/dep.  Patient with noticable edema pocket on scapula which decreased in size some after session but was sttill there.  Encouraged pattient to use heat or ice at home to help control the pain and decrease the edema. OT Plan: P:  Add AROM supine and continue to monitor edema pocket on left scapula.   Goals Short Term Goals Time to Complete Short Term Goals: 4  weeks Short Term Goal 1: Patient will be educated on HEP. Short Term Goal 2: Patient will increase PROM in right shoulder to [facility] for increased ability to bathe independently. Short Term Goal 3: Patient will increase right shoulder strength to 3+/5 for increased ability to use gardening tools. Short Term Goal 4: Patient will decrease pain in right shoulder to 5/10 while attempting to sleep. Short Term Goal 5: Patient will decrease fascial restrictions to min-mod in his right shoulder region. Long Term Goals Time to Complete Long Term Goals: 8 weeks Long Term Goal 1: Patient will return to prior level of I with all B/IADLS, work, and leisure activities. Long Term Goal 2: Patient will increase right shoulder AROM to WNL for increased ability to reach into overhead cabinets and place hand on the steering wheel. Long Term Goal 3: Patient will increase right shoulder strength to 5/5 for increased ability to complete remodeling projects at home. Long Term Goal 4: Patient will decrease pain in his right shoulder to 2/10 in his right shoulder while completing overhead activities. Long Term Goal 5: Patient will decrease restrictions to trace in his right shoulder region.  Problem List Patient Active Problem List  Diagnosis  . Scapula fracture  . Pain in joint, shoulder region  . Muscle weakness (generalized)    End of Session Activity Tolerance: Patient tolerated treatment well General Behavior During Session: [facility] for tasks performed Cognition: [facility] for tasks performed  GO   Brynlei Klausner L. Noralee Stain, COTA/L  06/10/2012, 6:28 PM

## 2012-06-12 ENCOUNTER — Ambulatory Visit (HOSPITAL_COMMUNITY): Payer: No Typology Code available for payment source | Admitting: Occupational Therapy

## 2012-06-17 ENCOUNTER — Ambulatory Visit (HOSPITAL_COMMUNITY): Payer: No Typology Code available for payment source | Admitting: Occupational Therapy

## 2012-06-20 ENCOUNTER — Inpatient Hospital Stay (HOSPITAL_COMMUNITY)
Admission: RE | Admit: 2012-06-20 | Payer: No Typology Code available for payment source | Source: Ambulatory Visit | Admitting: Specialist

## 2012-06-24 ENCOUNTER — Ambulatory Visit (HOSPITAL_COMMUNITY): Payer: No Typology Code available for payment source | Admitting: Occupational Therapy

## 2012-06-24 ENCOUNTER — Ambulatory Visit (HOSPITAL_COMMUNITY)
Admission: RE | Admit: 2012-06-24 | Discharge: 2012-06-24 | Disposition: A | Discharge status: Home / Self Care | Payer: No Typology Code available for payment source | Source: Ambulatory Visit | Attending: Occupational Therapy | Admitting: Occupational Therapy

## 2012-06-24 DIAGNOSIS — M6281 Muscle weakness (generalized): Secondary | ICD-10-CM

## 2012-06-24 DIAGNOSIS — S42109A Fracture of unspecified part of scapula, unspecified shoulder, initial encounter for closed fracture: Secondary | ICD-10-CM

## 2012-06-24 DIAGNOSIS — M25519 Pain in unspecified shoulder: Secondary | ICD-10-CM

## 2012-06-24 NOTE — Progress Notes (Addendum)
Occupational Therapy Treatment Patient Details  Name: DARRYL WILLNER MRN: [id number] Date of Birth: 1981/04/27  Today's Date: 06/24/2012 Time: 4098-1191 OT Time Calculation (min): 41 min Manual Therapy 501-518 17' Therapeutic Exercise 478-295 23'     Visit#: 4  of 16   Re-eval: 07/22/12 Assessment Diagnosis: Fracture of Right Scapula 04/21/12 Next MD Visit: 05/23/12  Authorization: 3 medicaid visits then further visits approved by Lubertha Basque   Authorization Time Period: through 06/14/12    Subjective Symptoms/Limitations Symptoms: S:  I have been sick.  My pain is only bad if I sleep on my right side. Pain Assessment Currently in Pain?: Yes Pain Score:   3 Pain Location: Shoulder Pain Orientation: Right Pain Type: Acute pain  Precautions/Restrictions     Exercise/Treatments Supine Protraction: PROM;Strengthening;10 reps Horizontal ABduction: PROM;Strengthening;10 reps External Rotation: PROM;Strengthening;10 reps Internal Rotation: PROM;Strengthening;10 reps Flexion: PROM;Strengthening;10 reps ABduction: PROM;Strengthening;10 reps Seated Extension: Theraband;10 reps Theraband Level (Shoulder Extension): Level 3 (Green) Retraction: Theraband;10 reps Theraband Level (Shoulder Retraction): Level 3 (Green) Row: Theraband;10 reps Theraband Level (Shoulder Row): Level 3 (Green) Protraction: AROM;10 reps Horizontal ABduction: AROM;10 reps External Rotation: AROM;Theraband;10 reps Theraband Level (Shoulder External Rotation): Level 3 (Green) Internal Rotation: AROM;Theraband;10 reps Theraband Level (Shoulder Internal Rotation): Level 3 (Green) Flexion: AROM;10 reps Abduction: AROM;10 reps Therapy Ball Flexion: 25 reps ABduction: 25 reps ROM / Strengthening / Isometric Strengthening Wall Wash: 3'    Manual Therapy Manual Therapy: Myofascial release MFR and manual stretching to right upper arm and scapular region to decrease pain and increase mobility    Occupational Therapy Assessment and Plan OT Assessment and Plan Clinical Impression Statement: A:  Added wall push ups and UBE switched to strengthening supine and AROM seated..  See progress note. OT Plan: P:  Continue 2x a week.   Goals Short Term Goals Time to Complete Short Term Goals: 4 weeks Short Term Goal 1: Patient will be educated on HEP. Short Term Goal 2: Patient will increase PROM in right shoulder to [facility] for increased ability to bathe independently. Short Term Goal 3: Patient will increase right shoulder strength to 3+/5 for increased ability to use gardening tools. Short Term Goal 4: Patient will decrease pain in right shoulder to 5/10 while attempting to sleep. Short Term Goal 5: Patient will decrease fascial restrictions to min-mod in his right shoulder region. Long Term Goals Time to Complete Long Term Goals: 8 weeks Long Term Goal 1: Patient will return to prior level of I with all B/IADLS, work, and leisure activities. Long Term Goal 2: Patient will increase right shoulder AROM to WNL for increased ability to reach into overhead cabinets and place hand on the steering wheel. Long Term Goal 3: Patient will increase right shoulder strength to 5/5 for increased ability to complete remodeling projects at home. Long Term Goal 4: Patient will decrease pain in his right shoulder to 2/10 in his right shoulder while completing overhead activities. Long Term Goal 5: Patient will decrease restrictions to trace in his right shoulder region.  Problem List Patient Active Problem List  Diagnosis  . Scapula fracture  . Pain in joint, shoulder region  . Muscle weakness (generalized)    End of Session Activity Tolerance: Patient tolerated treatment well General Behavior During Session: [facility] for tasks performed Cognition: [facility] for tasks performed  GO    Noralee Stain, Datrell Dunton L 06/24/2012, 6:11 PM

## 2012-06-27 ENCOUNTER — Ambulatory Visit (HOSPITAL_COMMUNITY): Payer: No Typology Code available for payment source | Admitting: Specialist

## 2012-06-28 ENCOUNTER — Ambulatory Visit (HOSPITAL_COMMUNITY): Payer: No Typology Code available for payment source | Admitting: Specialist

## 2012-07-01 ENCOUNTER — Ambulatory Visit (HOSPITAL_COMMUNITY): Payer: No Typology Code available for payment source | Admitting: Occupational Therapy

## 2012-07-02 ENCOUNTER — Ambulatory Visit (HOSPITAL_COMMUNITY)
Admission: RE | Admit: 2012-07-02 | Discharge: 2012-07-02 | Disposition: A | Discharge status: Home / Self Care | Payer: No Typology Code available for payment source | Source: Ambulatory Visit | Attending: Specialist | Admitting: Specialist

## 2012-07-02 DIAGNOSIS — M25519 Pain in unspecified shoulder: Secondary | ICD-10-CM

## 2012-07-02 DIAGNOSIS — S42109A Fracture of unspecified part of scapula, unspecified shoulder, initial encounter for closed fracture: Secondary | ICD-10-CM

## 2012-07-02 DIAGNOSIS — M6281 Muscle weakness (generalized): Secondary | ICD-10-CM

## 2012-07-02 NOTE — Progress Notes (Signed)
Occupational Therapy Treatment Patient Details  Name: Randy Castillo MRN: [id number] Date of Birth: March 14, 1981  Today's Date: 07/02/2012 Time: 1120-1200 OT Time Calculation (min): 40 min  Manual Therapy: [id number] 20' Therapeutic Exercise: 1135-1200 25' Visit#: 5  of 16   Re-eval: 07/22/12    Subjective Symptoms/Limitations Symptoms: S: I still cant sleep on my right side. I think I have arthritis. Pain Assessment Currently in Pain?: Yes Pain Score:   2 Pain Location: Scapula (medial border of R scapula) Pain Orientation: Right Pain Type: Acute pain  Precautions/Restrictions   N/A  Exercise/Treatments Supine Protraction: PROM;Strengthening;10 reps;Weights Protraction Weight (lbs): 2# Horizontal ABduction: PROM;Strengthening;10 reps;Weights Horizontal ABduction Weight (lbs): 2# External Rotation: PROM;Strengthening;10 reps;Weights External Rotation Weight (lbs): 2# Internal Rotation: PROM;Strengthening;10 reps;Weights Internal Rotation Weight (lbs): 2# Flexion: PROM;Strengthening;10 reps;Weights Shoulder Flexion Weight (lbs): 2# ABduction: PROM;Strengthening;10 reps;Weights Shoulder ABduction Weight (lbs): 2# Seated Extension: Theraband;12 reps Theraband Level (Shoulder Extension): Level 3 (Green) Retraction: Theraband;12 reps Theraband Level (Shoulder Retraction): Level 4 (Blue) Row: Theraband;12 reps Theraband Level (Shoulder Row): Level 4 (Blue)   Therapy Ball Flexion: Other (comment) (dc) ABduction: Other (comment) (dc) ROM / Strengthening / Isometric Strengthening Wall Wash: 2' with 1# Thumb Tacks: 1' Prot/Ret//Elev/Dep: 1'        Manual Therapy Manual Therapy: Myofascial release Myofascial Release: MFR and manual stretching to medial border of right scapula, shoulder and upper arm to decrease pain and restrictions and increase pain free P/AROM.  Occupational Therapy Assessment and Plan OT Assessment and Plan Clinical Impression Statement:  Increased time of UBE in reverse. Increased to 2# with supine strengthening and blue tbands. Patient requested to increase to blue band due to ease with green tband. OT Plan: P: Add shoulder strengthening in seated with 1#.    Goals Short Term Goals Time to Complete Short Term Goals: 4 weeks Short Term Goal 1: Patient will be educated on HEP. Short Term Goal 1 Progress: Met Short Term Goal 2: Patient will increase PROM in right shoulder to [facility] for increased ability to bathe independently. Short Term Goal 2 Progress: Met Short Term Goal 3: Patient will increase right shoulder strength to 3+/5 for increased ability to use gardening tools. Short Term Goal 3 Progress: Met Short Term Goal 4: Patient will decrease pain in right shoulder to 5/10 while attempting to sleep. Short Term Goal 4 Progress: Progressing toward goal Short Term Goal 5: Patient will decrease fascial restrictions to min-mod in his right shoulder region. Short Term Goal 5 Progress: Met Long Term Goals Time to Complete Long Term Goals: 8 weeks Long Term Goal 1: Patient will return to prior level of I with all B/IADLS, work, and leisure activities. Long Term Goal 1 Progress: Progressing toward goal Long Term Goal 2: Patient will increase right shoulder AROM to WNL for increased ability to reach into overhead cabinets and place hand on the steering wheel. Long Term Goal 2 Progress: Met Long Term Goal 3: Patient will increase right shoulder strength to 5/5 for increased ability to complete remodeling projects at home. Long Term Goal 3 Progress: Progressing toward goal Long Term Goal 4: Patient will decrease pain in his right shoulder to 2/10 in his right shoulder while completing overhead activities. Long Term Goal 4 Progress: Progressing toward goal Long Term Goal 5: Patient will decrease restrictions to trace in his right shoulder region. Long Term Goal 5 Progress: Progressing toward goal  Problem List Patient Active Problem  List  Diagnosis  . Scapula fracture  . Pain in joint,  shoulder region  . Muscle weakness (generalized)    End of Session Activity Tolerance: Patient tolerated treatment well General Behavior During Session: [facility] for tasks performed Cognition: [facility] for tasks performed  GO   Laverta Baltimore, OTS Occupational Therapy Student  07/02/2012, 12:57 PM

## 2012-07-02 NOTE — Progress Notes (Signed)
Note reviewed by clinical instructor and accurately reflects treatment session.  Hitoshi Werts H. Stevan Eberwein, OTR/L  

## 2012-07-03 ENCOUNTER — Ambulatory Visit (HOSPITAL_COMMUNITY): Payer: No Typology Code available for payment source | Admitting: Occupational Therapy

## 2012-07-04 ENCOUNTER — Ambulatory Visit (HOSPITAL_COMMUNITY): Payer: No Typology Code available for payment source | Admitting: Specialist

## 2012-07-08 ENCOUNTER — Ambulatory Visit (HOSPITAL_COMMUNITY): Payer: No Typology Code available for payment source | Admitting: Occupational Therapy

## 2012-07-11 ENCOUNTER — Ambulatory Visit (HOSPITAL_COMMUNITY): Payer: No Typology Code available for payment source | Admitting: Occupational Therapy

## 2012-07-15 ENCOUNTER — Ambulatory Visit (HOSPITAL_COMMUNITY)
Admission: RE | Admit: 2012-07-15 | Discharge: 2012-07-15 | Disposition: A | Discharge status: Home / Self Care | Payer: Medicaid Other | Source: Ambulatory Visit | Attending: Family Medicine | Admitting: Family Medicine

## 2012-07-15 DIAGNOSIS — M6281 Muscle weakness (generalized): Secondary | ICD-10-CM | POA: Insufficient documentation

## 2012-07-15 DIAGNOSIS — S42109A Fracture of unspecified part of scapula, unspecified shoulder, initial encounter for closed fracture: Secondary | ICD-10-CM

## 2012-07-15 DIAGNOSIS — IMO0001 Reserved for inherently not codable concepts without codable children: Secondary | ICD-10-CM | POA: Insufficient documentation

## 2012-07-15 DIAGNOSIS — M25519 Pain in unspecified shoulder: Secondary | ICD-10-CM | POA: Insufficient documentation

## 2012-07-15 NOTE — Progress Notes (Signed)
Occupational Therapy Treatment Patient Details  Name: Randy Castillo MRN: [id number] Date of Birth: [birth year]  Today's Date: 07/15/2012 Time: 4132-4401 OT Time Calculation (min): 36 min Manual Therapy 1600-1620 20' Therapeutic Exercises [phone] 16' Visit#: 6  of 16   Re-eval: 07/22/12    Authorization: 3 medicaid visits then further visits approved by Lubertha Basque    Subjective S:  It was fine until I drove over here, now it is hurting and I don't know why. Pain Assessment Currently in Pain?: Yes Pain Score:   7 Pain Location: Shoulder Pain Orientation: Right Pain Type: Acute pain  Precautions/Restrictions   N/A  Exercise/Treatments Supine Protraction: PROM;10 reps Horizontal ABduction: PROM;10 reps External Rotation: PROM;10 reps Internal Rotation: PROM;10 reps Flexion: PROM;10 reps ABduction: PROM;10 reps Other Supine Exercises: serratus anterior punch with 2 pounds x 10 reps Seated Retraction: Strengthening;10 reps Retraction Weight (lbs): 1 Protraction: Strengthening;10 reps Protraction Weight (lbs): 1 Horizontal ABduction: Strengthening;10 reps Horizontal ABduction Weight (lbs): 1 External Rotation: Strengthening;10 reps External Rotation Weight (lbs): 1 Internal Rotation: Strengthening;10 reps Internal Rotation Weight (lbs): 1 Flexion: Strengthening;10 reps Flexion Weight (lbs): 1 Abduction: Strengthening;10 reps ABduction Weight (lbs): 1 Prone  Retraction: Strengthening;10 reps Retraction Weight (lbs): 1 Extension: Strengthening;10 reps Extension Weight (lbs): 1 External Rotation: Strengthening;10 reps (with shoulder adducted) External Rotation Weight (lbs): 1 Horizontal ABduction 1: Strengthening;10 reps Horizontal ABduction 1 Weight (lbs): 1 Horizontal ABduction 2: Strengthening;10 reps Horizontal ABduction 2 Weight (lbs): 1 ROM / Strengthening / Isometric Strengthening UBE (Upper Arm Bike): begin in reverse next visit Wall Wash: resume  next visit Thumb Tacks: resume next visit "W" Arms: begin next visit X to V Arms: begin next visit Proximal Shoulder Strengthening, Supine: 2 pounds x 10 reps without rest Ball on Wall: begin next visit Prot/Ret//Elev/Dep: resume next visit      Manual Therapy Manual Therapy: Myofascial release Myofascial Release: MFR and manual stretching to medial border of right scapula, shoulder and upper arm to decrease pain and restrictions and increase pain free P/AROM. Significant winging in scapula noted. 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  Changed ther ex portion of treatment to focus in on scapular stability.   OT Plan: P:  Begin x to v, w arms, ball on wall, and UBE in reverse to further strengthen scapular stabilizer muscles.   Goals Short Term Goals Time to Complete Short Term Goals: 4 weeks Short Term Goal 1: Patient will be educated on HEP. Short Term Goal 2: Patient will increase PROM in right shoulder to [facility] for increased ability to bathe independently. Short Term Goal 3: Patient will increase right shoulder strength to 3+/5 for increased ability to use gardening tools. Short Term Goal 4: Patient will decrease pain in right shoulder to 5/10 while attempting to sleep. Short Term Goal 4 Progress: Progressing toward goal Short Term Goal 5: Patient will decrease fascial restrictions to min-mod in his right shoulder region. Short Term Goal 5 Progress: Progressing toward goal Long Term Goals Time to Complete Long Term Goals: 8 weeks Long Term Goal 1: Patient will return to prior level of I with all B/IADLS, work, and leisure activities. Long Term Goal 1 Progress: Progressing toward goal Long Term Goal 2: Patient will increase right shoulder AROM to WNL for increased ability to reach into overhead cabinets and place hand on the steering  wheel. Long Term Goal 3: Patient will increase right  shoulder strength to 5/5 for increased ability to complete remodeling projects at home. Long Term Goal 3 Progress: Progressing toward goal Long Term Goal 4: Patient will decrease pain in his right shoulder to 2/10 in his right shoulder while completing overhead activities. Long Term Goal 4 Progress: Progressing toward goal Long Term Goal 5: Patient will decrease restrictions to trace in his right shoulder region. Long Term Goal 5 Progress: Progressing toward goal  Problem List Patient Active Problem List  Diagnosis  . Scapula fracture  . Pain in joint, shoulder region  . Muscle weakness (generalized)    End of Session Activity Tolerance: Patient tolerated treatment well General Behavior During Session: [facility] for tasks performed Cognition: [facility] for tasks performed  GO    Shirlean Mylar, OTR/L  07/15/2012, 4:43 PM

## 2012-07-18 ENCOUNTER — Ambulatory Visit (HOSPITAL_COMMUNITY)
Admission: RE | Admit: 2012-07-18 | Discharge: 2012-07-18 | Disposition: A | Discharge status: Home / Self Care | Payer: Medicaid Other | Source: Ambulatory Visit | Attending: Family Medicine | Admitting: Family Medicine

## 2012-07-18 DIAGNOSIS — M6281 Muscle weakness (generalized): Secondary | ICD-10-CM

## 2012-07-18 DIAGNOSIS — M25519 Pain in unspecified shoulder: Secondary | ICD-10-CM

## 2012-07-18 DIAGNOSIS — S42109A Fracture of unspecified part of scapula, unspecified shoulder, initial encounter for closed fracture: Secondary | ICD-10-CM

## 2012-07-18 NOTE — Progress Notes (Signed)
Occupational Therapy Treatment Patient Details  Name: Randy Castillo MRN: [id number] Date of Birth: 1981/12/10  Today's Date: 07/18/2012 Time: 4098-1191 OT Time Calculation (min): 42 min Manual Therapy [phone] 17' Therapeutic Exercises [phone] 25' Visit#: 7  of 16   Re-eval: 07/22/12    Authorization: 3 medicaid visits then further visits approved by Lubertha Basque    Subjective S:  It has hurt since before my last visit.  I don't know what made it hurt, but I don't think anything we did at my last visit worsened it, if anything it helped.   Pain Assessment Currently in Pain?: Yes Pain Score:   6 Pain Location: Shoulder Pain Orientation: Right Pain Type: Acute pain Pain Relieving Factors: After MFR, pain decreased to 2/10 - mild discomfort  Precautions/Restrictions   N/A  Exercise/Treatments Supine Protraction: PROM;10 reps Horizontal ABduction: PROM;10 reps External Rotation: PROM;10 reps Internal Rotation: PROM;10 reps Flexion: PROM;10 reps ABduction: PROM;10 reps Other Supine Exercises: serratus anterior punch with 2 pounds x 12 reps Seated Retraction: Strengthening;12 reps Retraction Weight (lbs): 2 Protraction: Strengthening;12 reps Protraction Weight (lbs): 2 Horizontal ABduction: Right;12 reps Horizontal ABduction Weight (lbs): 2 External Rotation: Strengthening;12 reps External Rotation Weight (lbs): 2 Internal Rotation: Strengthening;12 reps Internal Rotation Weight (lbs): 2 Flexion: Strengthening;12 reps Flexion Weight (lbs): 2 Abduction: Strengthening;12 reps ABduction Weight (lbs): 2 Prone  Retraction: Strengthening;12 reps Retraction Weight (lbs): 2 Extension: Strengthening;12 reps Extension Weight (lbs): 2 External Rotation: Strengthening;12 reps External Rotation Weight (lbs): 2 Horizontal ABduction 1: Strengthening;12 reps Horizontal ABduction 1 Weight (lbs): 2 Horizontal ABduction 2: Strengthening;12 reps Horizontal ABduction 2 Weight  (lbs): 2 ROM / Strengthening / Isometric Strengthening UBE (Upper Arm Bike): begin in reverse next visit Wall Wash: resume next visit Thumb Tacks: 1' "W" Arms: x 10 X to V Arms: x 10 Proximal Shoulder Strengthening, Supine: omit this visit only Ball on Wall: 1' Prot/Ret//Elev/Dep: resume next visit      Manual Therapy Manual Therapy: Myofascial release Myofascial Release: MFR and manual stretching to medial border of right scapula, shoulder and upper arm to decrease pain and restrictions and increase pain free P/AROM.[id number]  Occupational Therapy Assessment and Plan OT Assessment and Plan Clinical Impression Statement: A:  Patient able to complete scapular strengthening/stability exercises with increased weight and reps with less fatigue.  Able to add 3 addtional scapular stability exercises today.    Pain level decreased to 2/10 after MFR intervention.  Patient unable to feel his rhomboids activating with scapular retraction. OT Plan: P:  Decrease pain to 2/10 with activity.     Goals Short Term Goals Time to Complete Short Term Goals: 4 weeks Short Term Goal 1: Patient will be educated on HEP. Short Term Goal 2: Patient will increase PROM in right shoulder to [facility] for increased ability to bathe independently. Short Term Goal 3: Patient will increase right shoulder strength to 3+/5 for increased ability to use gardening tools. Short Term Goal 4: Patient will decrease pain in right shoulder to 5/10 while attempting to sleep. Short Term Goal 5: Patient will decrease fascial restrictions to min-mod in his right shoulder region. Long Term Goals Time to Complete Long Term Goals: 8 weeks Long Term Goal 1: Patient will return to prior level of I with all B/IADLS, work, and leisure activities. Long Term Goal 2: Patient will increase right shoulder AROM to WNL for increased ability to reach into overhead cabinets and place hand on the steering wheel. Long Term Goal 3: Patient will increase  right shoulder  strength to 5/5 for increased ability to complete remodeling projects at home. Long Term Goal 4: Patient will decrease pain in his right shoulder to 2/10 in his right shoulder while completing overhead activities. Long Term Goal 5: Patient will decrease restrictions to trace in his right shoulder region.  Problem List Patient Active Problem List  Diagnosis  . Scapula fracture  . Pain in joint, shoulder region  . Muscle weakness (generalized)    End of Session Activity Tolerance: Patient tolerated treatment well General Behavior During Session: [facility] for tasks performed Cognition: [facility] for tasks performed  GO    Shirlean Mylar, OTR/L  07/18/2012, 2:00 PM

## 2012-07-24 ENCOUNTER — Ambulatory Visit (HOSPITAL_COMMUNITY): Payer: Medicaid Other | Admitting: Specialist

## 2012-07-30 ENCOUNTER — Ambulatory Visit (HOSPITAL_COMMUNITY)
Admission: RE | Admit: 2012-07-30 | Payer: Medicaid Other | Source: Ambulatory Visit | Attending: Family Medicine | Admitting: Family Medicine

## 2012-08-01 ENCOUNTER — Ambulatory Visit (HOSPITAL_COMMUNITY): Payer: Medicaid Other | Admitting: Specialist

## 2013-07-10 ENCOUNTER — Encounter (HOSPITAL_COMMUNITY): Payer: Self-pay | Admitting: *Deleted

## 2013-07-10 ENCOUNTER — Emergency Department (HOSPITAL_COMMUNITY)
Admission: EM | Admit: 2013-07-10 | Discharge: 2013-07-10 | Disposition: A | Discharge status: Home / Self Care | Payer: Medicaid Other | Attending: Emergency Medicine | Admitting: Emergency Medicine

## 2013-07-10 ENCOUNTER — Emergency Department (HOSPITAL_COMMUNITY): Payer: Medicaid Other

## 2013-07-10 DIAGNOSIS — F329 Major depressive disorder, single episode, unspecified: Secondary | ICD-10-CM | POA: Insufficient documentation

## 2013-07-10 DIAGNOSIS — I10 Essential (primary) hypertension: Secondary | ICD-10-CM | POA: Insufficient documentation

## 2013-07-10 DIAGNOSIS — J45909 Unspecified asthma, uncomplicated: Secondary | ICD-10-CM | POA: Insufficient documentation

## 2013-07-10 DIAGNOSIS — S51009A Unspecified open wound of unspecified elbow, initial encounter: Secondary | ICD-10-CM | POA: Insufficient documentation

## 2013-07-10 DIAGNOSIS — Z8679 Personal history of other diseases of the circulatory system: Secondary | ICD-10-CM | POA: Insufficient documentation

## 2013-07-10 DIAGNOSIS — W268XXA Contact with other sharp object(s), not elsewhere classified, initial encounter: Secondary | ICD-10-CM | POA: Insufficient documentation

## 2013-07-10 DIAGNOSIS — F3289 Other specified depressive episodes: Secondary | ICD-10-CM | POA: Insufficient documentation

## 2013-07-10 DIAGNOSIS — F17200 Nicotine dependence, unspecified, uncomplicated: Secondary | ICD-10-CM | POA: Insufficient documentation

## 2013-07-10 DIAGNOSIS — Z79899 Other long term (current) drug therapy: Secondary | ICD-10-CM | POA: Insufficient documentation

## 2013-07-10 DIAGNOSIS — F411 Generalized anxiety disorder: Secondary | ICD-10-CM | POA: Insufficient documentation

## 2013-07-10 DIAGNOSIS — IMO0002 Reserved for concepts with insufficient information to code with codable children: Secondary | ICD-10-CM

## 2013-07-10 DIAGNOSIS — Z8669 Personal history of other diseases of the nervous system and sense organs: Secondary | ICD-10-CM | POA: Insufficient documentation

## 2013-07-10 DIAGNOSIS — Y9389 Activity, other specified: Secondary | ICD-10-CM | POA: Insufficient documentation

## 2013-07-10 DIAGNOSIS — Y929 Unspecified place or not applicable: Secondary | ICD-10-CM | POA: Insufficient documentation

## 2013-07-10 DIAGNOSIS — Z8619 Personal history of other infectious and parasitic diseases: Secondary | ICD-10-CM | POA: Insufficient documentation

## 2013-07-10 DIAGNOSIS — R209 Unspecified disturbances of skin sensation: Secondary | ICD-10-CM | POA: Insufficient documentation

## 2013-07-10 MED ORDER — OXYCODONE-ACETAMINOPHEN 5-325 MG PO TABS: 1.0000 | ORAL_TABLET | ORAL | Status: DC | PRN

## 2013-07-10 MED ORDER — LIDOCAINE-EPINEPHRINE (PF) 2 %-1:200000 IJ SOLN
INTRAMUSCULAR | Status: AC
  Filled 2013-07-10: qty 20

## 2013-07-10 NOTE — ED Notes (Signed)
Pt presents with left arm deep laceration after attempting to fix storm window. Pulses equal and strong, brisk cap refill to fingers.

## 2013-07-10 NOTE — ED Provider Notes (Signed)
CSN: [id number]     Arrival date & time 07/10/13  2114 History     First MD Initiated Contact with Patient 07/10/13 2142     Chief Complaint  Patient presents with  . Extremity Laceration   (Consider location/radiation/quality/duration/timing/severity/associated sxs/prior Treatment) HPI Comments: Randy Castillo is a 32 y.o. Male presenting with laceration to his left elbow after he broke the storm window he was attempting to fix.  His wound bled copiously but is now hemostatic after applying pressure.  He denies weakness distal to the wound site but reports the tip of his 5th finger feels numb.  His tetanus is current.     The history is provided by the patient.    Past Medical History  Diagnosis Date  . Herpes   . Gonorrhea in male   . Asthma   . Environmental allergies   . Depression   . Migraine   . Hypertension   . Seizures   . Anxiety    History reviewed. No pertinent past surgical history. No family history on file. History  Substance Use Topics  . Smoking status: Current Every Day Smoker -- 1.00 packs/day  . Smokeless tobacco: Not on file  . Alcohol Use: No    Review of Systems  Constitutional: Negative for fever and chills.  Respiratory: Negative for shortness of breath.   Gastrointestinal: Negative for nausea.  Skin: Positive for wound.  Neurological: Positive for numbness. Negative for weakness.    Allergies  Darvocet and Methocarbamol  Home Medications   Current Outpatient Rx  Name  Route  Sig  Dispense  Refill  . albuterol (PROVENTIL HFA;VENTOLIN HFA) 108 (90 BASE) MCG/ACT inhaler   Inhalation   Inhale 2 puffs into the lungs every 6 (six) hours as needed for wheezing.         Marland Kitchen ALPRAZolam (XANAX) 1 MG tablet   Oral   Take 1 mg by mouth daily.         . buprenorphine (SUBUTEX) 8 MG SUBL SL tablet   Sublingual   Place 8 mg under the tongue daily.         . fluticasone (FLONASE) 50 MCG/ACT nasal spray   Nasal   Place 2 sprays into  the nose daily.         . hydrochlorothiazide (HYDRODIURIL) 25 MG tablet   Oral   Take 25 mg by mouth daily.          Marland Kitchen lisdexamfetamine (VYVANSE) 70 MG capsule   Oral   Take 70 mg by mouth daily.         Marland Kitchen OLANZapine-FLUoxetine (SYMBYAX) 6-25 MG per capsule   Oral   Take 1 capsule by mouth every evening.         . valACYclovir (VALTREX) 1000 MG tablet   Oral   Take 1,000 mg by mouth daily.         Marland Kitchen oxyCODONE-acetaminophen (PERCOCET/ROXICET) 5-325 MG per tablet   Oral   Take 1 tablet by mouth every 4 (four) hours as needed for pain.   15 tablet   0    BP 135/87  Pulse 114  Temp(Src) 99 F (37.2 C) (Oral)  Resp 20  Ht 5\' 10"  (1.778 m)  Wt 140 lb (63.504 kg)  BMI 20.09 kg/m2  SpO2 98% Physical Exam  Constitutional: He is oriented to person, place, and time. He appears well-developed and well-nourished.  HENT:  Head: Normocephalic.  Cardiovascular: Normal rate.   Pulmonary/Chest: Effort normal.  Musculoskeletal:  He exhibits no tenderness.       Left hand: He exhibits normal range of motion and normal capillary refill. Decreased sensation noted. Normal strength noted. He exhibits no finger abduction, no thumb/finger opposition and no wrist extension trouble.  Decreased sensation distal 5th finger tip.  Strength normal, FROM of digits.  Neurological: He is alert and oriented to person, place, and time. No sensory deficit.  Skin: Laceration noted.  Hemostatic flap laceration left dorsal proximal forearm, subcutaneous.    ED Course   Procedures (including critical care time)  LACERATION REPAIR Performed by: Burgess Amor Authorized by: Burgess Amor Consent: Verbal consent obtained. Risks and benefits: risks, benefits and alternatives were discussed Consent given by: patient Patient identity confirmed: provided demographic data Prepped and Draped in normal sterile fashion Wound explored  Laceration Location: left posterior elbow  Laceration Length: 3  cm  No Foreign Bodies seen or palpated  Anesthesia: local infiltration  Local anesthetic: lidocaine 2% with epinephrine  Anesthetic total: 3 ml  Irrigation method: syringe Amount of cleaning: standard  Skin closure: ethilon 4-0  Number of sutures: 7 Technique: simple interrupted  Patient tolerance: Patient tolerated the procedure well with no immediate complications.   Labs Reviewed - No data to display No results found. 1. Laceration     MDM  Bulky dressing applied.  Wound care instructions given.  Pt advised to have sutures removed in 10 days,  Return here sooner for any signs of infection including redness, swelling, worse pain or drainage of pus.     Burgess Amor, PA-C 07/13/13 [phone]

## 2013-07-17 NOTE — ED Provider Notes (Signed)
Medical screening examination/treatment/procedure(s) were performed by non-physician practitioner and as supervising physician I was immediately available for consultation/collaboration.  Matin Mattioli, MD 07/17/13 0938 

## 2014-02-10 DIAGNOSIS — F3289 Other specified depressive episodes: Secondary | ICD-10-CM | POA: Diagnosis not present

## 2014-02-10 DIAGNOSIS — F329 Major depressive disorder, single episode, unspecified: Secondary | ICD-10-CM | POA: Diagnosis not present

## 2014-02-10 DIAGNOSIS — E781 Pure hyperglyceridemia: Secondary | ICD-10-CM | POA: Diagnosis not present

## 2014-02-19 ENCOUNTER — Encounter: Payer: Self-pay | Admitting: Internal Medicine

## 2014-02-19 ENCOUNTER — Ambulatory Visit: Payer: Medicaid Other | Admitting: Internal Medicine

## 2014-03-20 DIAGNOSIS — Z79899 Other long term (current) drug therapy: Secondary | ICD-10-CM | POA: Diagnosis not present

## 2014-03-20 DIAGNOSIS — G894 Chronic pain syndrome: Secondary | ICD-10-CM | POA: Diagnosis not present

## 2014-03-23 DIAGNOSIS — G894 Chronic pain syndrome: Secondary | ICD-10-CM | POA: Diagnosis not present

## 2014-03-23 DIAGNOSIS — Z79899 Other long term (current) drug therapy: Secondary | ICD-10-CM | POA: Diagnosis not present

## 2014-04-21 DIAGNOSIS — M549 Dorsalgia, unspecified: Secondary | ICD-10-CM | POA: Diagnosis not present

## 2014-04-21 DIAGNOSIS — M545 Low back pain, unspecified: Secondary | ICD-10-CM | POA: Diagnosis not present

## 2014-04-21 DIAGNOSIS — M542 Cervicalgia: Secondary | ICD-10-CM | POA: Diagnosis not present

## 2014-04-21 DIAGNOSIS — G4700 Insomnia, unspecified: Secondary | ICD-10-CM | POA: Diagnosis not present

## 2014-05-07 DIAGNOSIS — G4700 Insomnia, unspecified: Secondary | ICD-10-CM | POA: Diagnosis not present

## 2014-05-07 DIAGNOSIS — M545 Low back pain, unspecified: Secondary | ICD-10-CM | POA: Diagnosis not present

## 2014-05-07 DIAGNOSIS — M175 Other unilateral secondary osteoarthritis of knee: Secondary | ICD-10-CM | POA: Diagnosis not present

## 2014-05-07 DIAGNOSIS — M542 Cervicalgia: Secondary | ICD-10-CM | POA: Diagnosis not present

## 2014-05-19 DIAGNOSIS — M25569 Pain in unspecified knee: Secondary | ICD-10-CM | POA: Diagnosis not present

## 2014-05-19 DIAGNOSIS — G894 Chronic pain syndrome: Secondary | ICD-10-CM | POA: Diagnosis not present

## 2014-05-19 DIAGNOSIS — M545 Low back pain, unspecified: Secondary | ICD-10-CM | POA: Diagnosis not present

## 2014-05-19 DIAGNOSIS — Z79899 Other long term (current) drug therapy: Secondary | ICD-10-CM | POA: Diagnosis not present

## 2014-05-25 DIAGNOSIS — R5383 Other fatigue: Secondary | ICD-10-CM | POA: Diagnosis not present

## 2014-05-25 DIAGNOSIS — F430 Acute stress reaction: Secondary | ICD-10-CM | POA: Diagnosis not present

## 2014-05-25 DIAGNOSIS — M542 Cervicalgia: Secondary | ICD-10-CM | POA: Diagnosis not present

## 2014-05-25 DIAGNOSIS — M175 Other unilateral secondary osteoarthritis of knee: Secondary | ICD-10-CM | POA: Diagnosis not present

## 2014-05-25 DIAGNOSIS — R5381 Other malaise: Secondary | ICD-10-CM | POA: Diagnosis not present

## 2014-05-25 DIAGNOSIS — M545 Low back pain, unspecified: Secondary | ICD-10-CM | POA: Diagnosis not present

## 2014-05-25 DIAGNOSIS — E538 Deficiency of other specified B group vitamins: Secondary | ICD-10-CM | POA: Diagnosis not present

## 2014-05-25 DIAGNOSIS — G4700 Insomnia, unspecified: Secondary | ICD-10-CM | POA: Diagnosis not present

## 2014-05-25 DIAGNOSIS — M810 Age-related osteoporosis without current pathological fracture: Secondary | ICD-10-CM | POA: Diagnosis not present

## 2014-06-16 DIAGNOSIS — G894 Chronic pain syndrome: Secondary | ICD-10-CM | POA: Diagnosis not present

## 2014-06-16 DIAGNOSIS — M545 Low back pain, unspecified: Secondary | ICD-10-CM | POA: Diagnosis not present

## 2014-06-16 DIAGNOSIS — M25569 Pain in unspecified knee: Secondary | ICD-10-CM | POA: Diagnosis not present

## 2014-06-16 DIAGNOSIS — Z79899 Other long term (current) drug therapy: Secondary | ICD-10-CM | POA: Diagnosis not present

## 2014-07-20 DIAGNOSIS — Z79899 Other long term (current) drug therapy: Secondary | ICD-10-CM | POA: Diagnosis not present

## 2014-07-20 DIAGNOSIS — M545 Low back pain, unspecified: Secondary | ICD-10-CM | POA: Diagnosis not present

## 2014-07-20 DIAGNOSIS — M25569 Pain in unspecified knee: Secondary | ICD-10-CM | POA: Diagnosis not present

## 2014-07-20 DIAGNOSIS — G894 Chronic pain syndrome: Secondary | ICD-10-CM | POA: Diagnosis not present

## 2014-08-19 DIAGNOSIS — M545 Low back pain, unspecified: Secondary | ICD-10-CM | POA: Diagnosis not present

## 2014-08-19 DIAGNOSIS — Z79899 Other long term (current) drug therapy: Secondary | ICD-10-CM | POA: Diagnosis not present

## 2014-08-19 DIAGNOSIS — G8929 Other chronic pain: Secondary | ICD-10-CM | POA: Diagnosis not present

## 2014-08-19 DIAGNOSIS — M25569 Pain in unspecified knee: Secondary | ICD-10-CM | POA: Diagnosis not present

## 2014-09-17 DIAGNOSIS — Z79899 Other long term (current) drug therapy: Secondary | ICD-10-CM | POA: Diagnosis not present

## 2014-09-17 DIAGNOSIS — M79606 Pain in leg, unspecified: Secondary | ICD-10-CM | POA: Diagnosis not present

## 2014-09-17 DIAGNOSIS — M545 Low back pain: Secondary | ICD-10-CM | POA: Diagnosis not present

## 2014-09-17 DIAGNOSIS — G8929 Other chronic pain: Secondary | ICD-10-CM | POA: Diagnosis not present

## 2014-10-07 DIAGNOSIS — Z23 Encounter for immunization: Secondary | ICD-10-CM | POA: Diagnosis not present

## 2014-10-07 DIAGNOSIS — F4323 Adjustment disorder with mixed anxiety and depressed mood: Secondary | ICD-10-CM | POA: Diagnosis not present

## 2014-10-07 DIAGNOSIS — G622 Polyneuropathy due to other toxic agents: Secondary | ICD-10-CM | POA: Diagnosis not present

## 2014-10-21 ENCOUNTER — Encounter (HOSPITAL_COMMUNITY): Payer: Self-pay | Admitting: Emergency Medicine

## 2014-10-21 ENCOUNTER — Emergency Department (HOSPITAL_COMMUNITY)
Admission: EM | Admit: 2014-10-21 | Discharge: 2014-10-21 | Discharge status: Against Medical Advice | Payer: Medicare Other | Attending: Emergency Medicine | Admitting: Emergency Medicine

## 2014-10-21 DIAGNOSIS — R109 Unspecified abdominal pain: Secondary | ICD-10-CM | POA: Diagnosis not present

## 2014-10-21 DIAGNOSIS — Z8619 Personal history of other infectious and parasitic diseases: Secondary | ICD-10-CM | POA: Insufficient documentation

## 2014-10-21 DIAGNOSIS — G43909 Migraine, unspecified, not intractable, without status migrainosus: Secondary | ICD-10-CM | POA: Diagnosis not present

## 2014-10-21 DIAGNOSIS — Z720 Tobacco use: Secondary | ICD-10-CM | POA: Insufficient documentation

## 2014-10-21 DIAGNOSIS — F419 Anxiety disorder, unspecified: Secondary | ICD-10-CM | POA: Diagnosis not present

## 2014-10-21 DIAGNOSIS — Z7952 Long term (current) use of systemic steroids: Secondary | ICD-10-CM | POA: Diagnosis not present

## 2014-10-21 DIAGNOSIS — G40909 Epilepsy, unspecified, not intractable, without status epilepticus: Secondary | ICD-10-CM | POA: Insufficient documentation

## 2014-10-21 DIAGNOSIS — J45909 Unspecified asthma, uncomplicated: Secondary | ICD-10-CM | POA: Insufficient documentation

## 2014-10-21 DIAGNOSIS — F329 Major depressive disorder, single episode, unspecified: Secondary | ICD-10-CM | POA: Insufficient documentation

## 2014-10-21 DIAGNOSIS — I10 Essential (primary) hypertension: Secondary | ICD-10-CM | POA: Diagnosis not present

## 2014-10-21 DIAGNOSIS — Z79899 Other long term (current) drug therapy: Secondary | ICD-10-CM | POA: Diagnosis not present

## 2014-10-21 HISTORY — DX: Neuralgia and neuritis, unspecified: M79.2

## 2014-10-21 LAB — COMPREHENSIVE METABOLIC PANEL
ALBUMIN: 4.2 g/dL (ref 3.5–5.2)
ALT: 49 U/L (ref 0–53)
ANION GAP: 12 (ref 5–15)
AST: 47 U/L — ABNORMAL HIGH (ref 0–37)
Alkaline Phosphatase: 64 U/L (ref 39–117)
BILIRUBIN TOTAL: 0.4 mg/dL (ref 0.3–1.2)
BUN: 13 mg/dL (ref 6–23)
CHLORIDE: 100 meq/L (ref 96–112)
CO2: 28 mEq/L (ref 19–32)
CREATININE: 0.88 mg/dL (ref 0.50–1.35)
Calcium: 9.7 mg/dL (ref 8.4–10.5)
Glucose, Bld: 107 mg/dL — ABNORMAL HIGH (ref 70–99)
Potassium: 4.1 mEq/L (ref 3.7–5.3)
Sodium: 140 mEq/L (ref 137–147)
Total Protein: 8.2 g/dL (ref 6.0–8.3)

## 2014-10-21 LAB — CBC WITH DIFFERENTIAL/PLATELET
BASOS ABS: 0 10*3/uL (ref 0.0–0.1)
Basophils Relative: 1 % (ref 0–1)
Eosinophils Absolute: 0.1 10*3/uL (ref 0.0–0.7)
Eosinophils Relative: 2 % (ref 0–5)
HEMATOCRIT: 39.8 % (ref 39.0–52.0)
Hemoglobin: 13.9 g/dL (ref 13.0–17.0)
LYMPHS PCT: 39 % (ref 12–46)
Lymphs Abs: 2.2 10*3/uL (ref 0.7–4.0)
MCH: 31 pg (ref 26.0–34.0)
MCHC: 34.9 g/dL (ref 30.0–36.0)
MCV: 88.8 fL (ref 78.0–100.0)
Monocytes Absolute: 0.5 10*3/uL (ref 0.1–1.0)
Monocytes Relative: 8 % (ref 3–12)
NEUTROS ABS: 2.8 10*3/uL (ref 1.7–7.7)
NEUTROS PCT: 50 % (ref 43–77)
PLATELETS: 232 10*3/uL (ref 150–400)
RBC: 4.48 MIL/uL (ref 4.22–5.81)
RDW: 13.4 % (ref 11.5–15.5)
WBC: 5.5 10*3/uL (ref 4.0–10.5)

## 2014-10-21 LAB — LIPASE, BLOOD: LIPASE: 15 U/L (ref 11–59)

## 2014-10-21 MED ORDER — ONDANSETRON HCL 4 MG/2ML IJ SOLN
4.0000 mg | Freq: Once | INTRAMUSCULAR | Status: AC
  Administered 2014-10-21: 4 mg via INTRAVENOUS

## 2014-10-21 MED ORDER — KETOROLAC TROMETHAMINE 30 MG/ML IJ SOLN
30.0000 mg | Freq: Once | INTRAMUSCULAR | Status: AC
  Administered 2014-10-21: 30 mg via INTRAVENOUS
  Filled 2014-10-21: qty 1

## 2014-10-21 MED ORDER — SODIUM CHLORIDE 0.9 % IV BOLUS (SEPSIS)
1000.0000 mL | Freq: Once | INTRAVENOUS | Status: AC
  Administered 2014-10-21: 1000 mL via INTRAVENOUS

## 2014-10-21 MED ORDER — ONDANSETRON HCL 4 MG/2ML IJ SOLN
4.0000 mg | Freq: Once | INTRAMUSCULAR | Status: DC
  Filled 2014-10-21: qty 2

## 2014-10-21 NOTE — ED Notes (Signed)
Pt. Was asked to urinate in a specimen cup. Pt. States that he could not stand to urinate in the cup and asked friend to help him stand up after using bathroom. Pt. Was squatting on ground when friend came in to help him up. The Pt. Claims that he did urinate in the toilet but not the cup and I heard him flush. He asked what he was going to be tested for from the urine sample, right before he started urinating.
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
## 2014-10-21 NOTE — ED Notes (Addendum)
Pt angry that he was not given a dose of Opana for pain.  Reinforced with pt that the physician informed him that if his lab work was not abnormal, he would be discharged home so that he may take his regular prescribed medications which include his narcotic pain medications but that no additional doses of narcotics would be given without justifiable medical rationale for it. Pt also reported that he was never informed which lab tests were to be done, pt states "the doctor just said he was doing basic labs".  Encouraged the patient that if he had specific questions, he should advocate for himself and ask these questions.  Pt again asked for pain medications and again I reinforced with pt that the physician was not going to be giving additional doses of narcotics without justification for doing so. Pt states "Well, I don't have to be here".  Encouraged pt to remain to remainder of testing could be completed and physician could reevaluate.  Pt refused and pulled out his IV.  Pt refused staff assistance in taking IV out and refused to sign AMA form.
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
## 2014-10-22 DIAGNOSIS — M545 Low back pain: Secondary | ICD-10-CM | POA: Diagnosis not present

## 2014-10-22 DIAGNOSIS — Z79899 Other long term (current) drug therapy: Secondary | ICD-10-CM | POA: Diagnosis not present

## 2014-10-22 DIAGNOSIS — M797 Fibromyalgia: Secondary | ICD-10-CM | POA: Diagnosis not present

## 2014-10-22 DIAGNOSIS — M79606 Pain in leg, unspecified: Secondary | ICD-10-CM | POA: Diagnosis not present

## 2014-10-26 DIAGNOSIS — A64 Unspecified sexually transmitted disease: Secondary | ICD-10-CM | POA: Diagnosis not present

## 2014-10-26 DIAGNOSIS — Z21 Asymptomatic human immunodeficiency virus [HIV] infection status: Secondary | ICD-10-CM | POA: Diagnosis not present

## 2014-10-26 DIAGNOSIS — Z114 Encounter for screening for human immunodeficiency virus [HIV]: Secondary | ICD-10-CM | POA: Diagnosis not present

## 2014-10-26 DIAGNOSIS — Z202 Contact with and (suspected) exposure to infections with a predominantly sexual mode of transmission: Secondary | ICD-10-CM | POA: Diagnosis not present

## 2014-10-26 DIAGNOSIS — Z113 Encounter for screening for infections with a predominantly sexual mode of transmission: Secondary | ICD-10-CM | POA: Diagnosis not present

## 2014-10-26 DIAGNOSIS — G894 Chronic pain syndrome: Secondary | ICD-10-CM | POA: Diagnosis not present

## 2014-10-26 DIAGNOSIS — F4323 Adjustment disorder with mixed anxiety and depressed mood: Secondary | ICD-10-CM | POA: Diagnosis not present

## 2014-10-26 DIAGNOSIS — G622 Polyneuropathy due to other toxic agents: Secondary | ICD-10-CM | POA: Diagnosis not present

## 2014-10-26 DIAGNOSIS — F4312 Post-traumatic stress disorder, chronic: Secondary | ICD-10-CM | POA: Diagnosis not present

## 2014-10-26 DIAGNOSIS — K7290 Hepatic failure, unspecified without coma: Secondary | ICD-10-CM | POA: Diagnosis not present

## 2014-11-16 DIAGNOSIS — R252 Cramp and spasm: Secondary | ICD-10-CM | POA: Diagnosis not present

## 2014-11-16 DIAGNOSIS — M545 Low back pain: Secondary | ICD-10-CM | POA: Diagnosis not present

## 2014-11-16 DIAGNOSIS — M797 Fibromyalgia: Secondary | ICD-10-CM | POA: Diagnosis not present

## 2014-11-16 DIAGNOSIS — M79606 Pain in leg, unspecified: Secondary | ICD-10-CM | POA: Diagnosis not present

## 2014-12-16 DIAGNOSIS — M545 Low back pain: Secondary | ICD-10-CM | POA: Diagnosis not present

## 2014-12-16 DIAGNOSIS — R252 Cramp and spasm: Secondary | ICD-10-CM | POA: Diagnosis not present

## 2014-12-16 DIAGNOSIS — M797 Fibromyalgia: Secondary | ICD-10-CM | POA: Diagnosis not present

## 2014-12-16 DIAGNOSIS — M79606 Pain in leg, unspecified: Secondary | ICD-10-CM | POA: Diagnosis not present

## 2014-12-25 ENCOUNTER — Telehealth: Payer: Self-pay

## 2014-12-25 DIAGNOSIS — F4323 Adjustment disorder with mixed anxiety and depressed mood: Secondary | ICD-10-CM | POA: Diagnosis not present

## 2014-12-25 DIAGNOSIS — G894 Chronic pain syndrome: Secondary | ICD-10-CM | POA: Diagnosis not present

## 2014-12-25 DIAGNOSIS — F4312 Post-traumatic stress disorder, chronic: Secondary | ICD-10-CM | POA: Diagnosis not present

## 2014-12-25 NOTE — Telephone Encounter (Signed)
Patient contacted regarding new intake appointment. Date and time given. Information given regarding documents needed to qualify for financial eligibility.   Bring medicaid and medicare cards with picture ID . Laurell Josephsammy K King, RN

## 2014-12-31 ENCOUNTER — Other Ambulatory Visit (HOSPITAL_COMMUNITY)
Admission: RE | Admit: 2014-12-31 | Discharge: 2014-12-31 | Disposition: A | Discharge status: Home / Self Care | Payer: Medicare Other | Source: Ambulatory Visit | Attending: Infectious Diseases | Admitting: Infectious Diseases

## 2014-12-31 ENCOUNTER — Ambulatory Visit (INDEPENDENT_AMBULATORY_CARE_PROVIDER_SITE_OTHER): Payer: Medicare Other

## 2014-12-31 DIAGNOSIS — Z23 Encounter for immunization: Secondary | ICD-10-CM | POA: Diagnosis not present

## 2014-12-31 DIAGNOSIS — B20 Human immunodeficiency virus [HIV] disease: Secondary | ICD-10-CM | POA: Diagnosis not present

## 2014-12-31 DIAGNOSIS — F909 Attention-deficit hyperactivity disorder, unspecified type: Secondary | ICD-10-CM | POA: Insufficient documentation

## 2014-12-31 DIAGNOSIS — F902 Attention-deficit hyperactivity disorder, combined type: Secondary | ICD-10-CM

## 2014-12-31 DIAGNOSIS — Z111 Encounter for screening for respiratory tuberculosis: Secondary | ICD-10-CM | POA: Diagnosis not present

## 2014-12-31 DIAGNOSIS — B029 Zoster without complications: Secondary | ICD-10-CM | POA: Insufficient documentation

## 2014-12-31 DIAGNOSIS — Z113 Encounter for screening for infections with a predominantly sexual mode of transmission: Secondary | ICD-10-CM | POA: Insufficient documentation

## 2014-12-31 DIAGNOSIS — Z79899 Other long term (current) drug therapy: Secondary | ICD-10-CM

## 2014-12-31 DIAGNOSIS — J45901 Unspecified asthma with (acute) exacerbation: Secondary | ICD-10-CM | POA: Insufficient documentation

## 2014-12-31 DIAGNOSIS — F411 Generalized anxiety disorder: Secondary | ICD-10-CM

## 2014-12-31 DIAGNOSIS — F319 Bipolar disorder, unspecified: Secondary | ICD-10-CM

## 2014-12-31 DIAGNOSIS — J4521 Mild intermittent asthma with (acute) exacerbation: Secondary | ICD-10-CM

## 2014-12-31 LAB — LIPID PANEL
CHOLESTEROL: 133 mg/dL (ref 0–200)
HDL: 36 mg/dL — AB (ref >39)
LDL Cholesterol: 75 mg/dL (ref 0–99)
TRIGLYCERIDES: 111 mg/dL (ref <150)
Total CHOL/HDL Ratio: 3.7 Ratio
VLDL: 22 mg/dL (ref 0–40)

## 2014-12-31 LAB — COMPLETE METABOLIC PANEL WITH GFR
ALBUMIN: 3.5 g/dL (ref 3.5–5.2)
ALT: 63 U/L — AB (ref 0–53)
AST: 51 U/L — ABNORMAL HIGH (ref 0–37)
Alkaline Phosphatase: 65 U/L (ref 39–117)
BUN: 8 mg/dL (ref 6–23)
CO2: 28 meq/L (ref 19–32)
CREATININE: 0.85 mg/dL (ref 0.50–1.35)
Calcium: 9 mg/dL (ref 8.4–10.5)
Chloride: 107 mEq/L (ref 96–112)
GFR, Est African American: >89 mL/min
GFR, Est Non African American: >89 mL/min
Glucose, Bld: 83 mg/dL (ref 70–99)
Potassium: 4 mEq/L (ref 3.5–5.3)
SODIUM: 141 meq/L (ref 135–145)
TOTAL PROTEIN: 6.6 g/dL (ref 6.0–8.3)
Total Bilirubin: 0.3 mg/dL (ref 0.2–1.2)
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
## 2015-01-01 LAB — CBC WITH DIFFERENTIAL/PLATELET
BASOS ABS: 0 10*3/uL (ref 0.0–0.1)
BASOS PCT: 0 % (ref 0–1)
Eosinophils Absolute: 0.1 10*3/uL (ref 0.0–0.7)
Eosinophils Relative: 1 % (ref 0–5)
HCT: 40.6 % (ref 39.0–52.0)
Hemoglobin: 13.3 g/dL (ref 13.0–17.0)
LYMPHS ABS: 2.1 10*3/uL (ref 0.7–4.0)
Lymphocytes Relative: 31 % (ref 12–46)
MCH: 28.3 pg (ref 26.0–34.0)
MCHC: 32.8 g/dL (ref 30.0–36.0)
MCV: 86.4 fL (ref 78.0–100.0)
MONOS PCT: 9 % (ref 3–12)
MPV: 11.3 fL (ref 8.6–12.4)
Monocytes Absolute: 0.6 10*3/uL (ref 0.1–1.0)
Neutro Abs: 4 10*3/uL (ref 1.7–7.7)
Neutrophils Relative %: 59 % (ref 43–77)
Platelets: 154 10*3/uL (ref 150–400)
RBC: 4.7 MIL/uL (ref 4.22–5.81)
RDW: 14.1 % (ref 11.5–15.5)
WBC: 6.7 10*3/uL (ref 4.0–10.5)

## 2015-01-01 LAB — URINALYSIS
BILIRUBIN URINE: NEGATIVE
Glucose, UA: NEGATIVE mg/dL
HGB URINE DIPSTICK: NEGATIVE
KETONES UR: NEGATIVE mg/dL
LEUKOCYTES UA: NEGATIVE
Nitrite: NEGATIVE
PROTEIN: NEGATIVE mg/dL
SPECIFIC GRAVITY, URINE: 1.013 (ref 1.005–1.030)
Urobilinogen, UA: 0.2 mg/dL (ref 0.0–1.0)
pH: 5.5 (ref 5.0–8.0)

## 2015-01-01 LAB — HEPATITIS C ANTIBODY: HCV Ab: NEGATIVE

## 2015-01-01 LAB — HEPATITIS B CORE ANTIBODY, TOTAL: HEP B C TOTAL AB: NONREACTIVE

## 2015-01-01 LAB — T-HELPER CELL (CD4) - (RCID CLINIC ONLY)
CD4 % Helper T Cell: 21 % — ABNORMAL LOW (ref 33–55)
CD4 T CELL ABS: 440 /uL (ref 400–2700)

## 2015-01-01 LAB — HEPATITIS B SURFACE ANTIBODY,QUALITATIVE: Hep B S Ab: NEGATIVE

## 2015-01-01 LAB — HEPATITIS A ANTIBODY, TOTAL: Hep A Total Ab: REACTIVE — AB

## 2015-01-01 LAB — URINE CYTOLOGY ANCILLARY ONLY
Chlamydia: NEGATIVE
Neisseria Gonorrhea: NEGATIVE

## 2015-01-01 LAB — RPR

## 2015-01-01 LAB — HEPATITIS B SURFACE ANTIGEN: Hepatitis B Surface Ag: NEGATIVE

## 2015-01-03 LAB — QUANTIFERON TB GOLD ASSAY (BLOOD)
Interferon Gamma Release Assay: NEGATIVE
Mitogen value: 4.32 IU/mL
QUANTIFERON TB AG MINUS NIL: 0 [IU]/mL
Quantiferon Nil Value: 0.04 IU/mL
TB Ag value: 0.03 IU/mL

## 2015-01-04 LAB — HIV-1 RNA ULTRAQUANT REFLEX TO GENTYP+
HIV 1 RNA Quant: 250731 copies/mL — ABNORMAL HIGH (ref <20)
HIV-1 RNA Quant, Log: 5.4 {Log} — ABNORMAL HIGH (ref <1.30)

## 2015-01-11 LAB — HLA B*5701: HLA-B 5701 W/RFLX HLA-B HIGH: NEGATIVE

## 2015-01-12 LAB — HIV-1 GENOTYPR PLUS

## 2015-01-14 ENCOUNTER — Ambulatory Visit: Payer: Medicare Other | Admitting: Infectious Diseases

## 2015-01-26 ENCOUNTER — Ambulatory Visit (INDEPENDENT_AMBULATORY_CARE_PROVIDER_SITE_OTHER): Payer: Medicare Other | Admitting: Infectious Diseases

## 2015-01-26 ENCOUNTER — Encounter: Payer: Self-pay | Admitting: Infectious Diseases

## 2015-01-26 VITALS — BP 135/89 | HR 93 | Temp 98.0°F | Ht 70.0 in | Wt 158.0 lb

## 2015-01-26 DIAGNOSIS — F319 Bipolar disorder, unspecified: Secondary | ICD-10-CM | POA: Diagnosis not present

## 2015-01-26 DIAGNOSIS — Z23 Encounter for immunization: Secondary | ICD-10-CM

## 2015-01-26 DIAGNOSIS — B20 Human immunodeficiency virus [HIV] disease: Secondary | ICD-10-CM

## 2015-01-26 MED ORDER — ELVITEG-COBIC-EMTRICIT-TENOFAF 150-150-200-10 MG PO TABS: 1.0000 | ORAL_TABLET | Freq: Every day | ORAL | Status: DC

## 2015-01-26 NOTE — Assessment & Plan Note (Signed)
He has f/u with psychiatry, greatly appreciate this and Dr Tedra SenegalBland's f/u.

## 2015-01-26 NOTE — Assessment & Plan Note (Signed)
Discussed his CD4 and his ART choices. Will start him on genvoya.  He states he is not sexually active. Offered/refused condoms.  Needs Hep B vax rtc 1 month.

## 2015-01-26 NOTE — Addendum Note (Signed)
Addended by: Wendall MolaOCKERHAM, Markeia Harkless A on: 01/26/2015 09:44 AM   Modules accepted: Orders
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
## 2015-01-27 DIAGNOSIS — R252 Cramp and spasm: Secondary | ICD-10-CM | POA: Diagnosis not present

## 2015-01-27 DIAGNOSIS — M797 Fibromyalgia: Secondary | ICD-10-CM | POA: Diagnosis not present

## 2015-01-27 DIAGNOSIS — M79606 Pain in leg, unspecified: Secondary | ICD-10-CM | POA: Diagnosis not present

## 2015-01-27 DIAGNOSIS — M545 Low back pain: Secondary | ICD-10-CM | POA: Diagnosis not present

## 2015-02-22 ENCOUNTER — Encounter: Payer: Self-pay | Admitting: Infectious Diseases

## 2015-02-22 ENCOUNTER — Ambulatory Visit (INDEPENDENT_AMBULATORY_CARE_PROVIDER_SITE_OTHER): Payer: Medicare Other | Admitting: Infectious Diseases

## 2015-02-22 VITALS — BP 110/72 | HR 109 | Temp 98.0°F | Ht 70.0 in | Wt 152.0 lb

## 2015-02-22 DIAGNOSIS — Z23 Encounter for immunization: Secondary | ICD-10-CM | POA: Diagnosis not present

## 2015-02-22 DIAGNOSIS — F319 Bipolar disorder, unspecified: Secondary | ICD-10-CM | POA: Diagnosis not present

## 2015-02-22 DIAGNOSIS — B20 Human immunodeficiency virus [HIV] disease: Secondary | ICD-10-CM

## 2015-02-22 NOTE — Progress Notes (Signed)
   Subjective:    Patient ID: Randy Castillo, male    DOB: 12/04/1981, 34 y.o.   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 He was was started on genvoya 01-2015.  Feels tired this AM.  Has not noticed side effects from his ART. No GI issues. No missed doses.   HIV 1 RNA QUANT (copies/mL)  Date Value  12/31/2014 [id number]*   CD4 T CELL ABS (/uL)  Date Value  12/31/2014 440    Review of Systems  Constitutional: Positive for fatigue. Negative for appetite change and unexpected weight change.  Gastrointestinal: Negative for diarrhea and constipation.  Genitourinary: Negative for difficulty urinating.       Objective:   Physical Exam  Constitutional: He appears well-developed and well-nourished.  HENT:  Mouth/Throat: No oropharyngeal exudate.  Eyes: EOM are normal. Pupils are equal, round, and reactive to light.  Neck: Neck supple.  Cardiovascular: Normal rate, regular rhythm and normal heart sounds.   Pulmonary/Chest: Effort normal and breath sounds normal.  Abdominal: Soft. Bowel sounds are normal. He exhibits no distension. There is no tenderness.  Lymphadenopathy:    He has no cervical adenopathy.  Psychiatric: He has a normal mood and affect.       Assessment & Plan:

## 2015-02-22 NOTE — Assessment & Plan Note (Signed)
Appears to be doing well. Will check his cd4 and hiv rna today.  Offered/refused condoms.  rtc 3-4 months.

## 2015-02-22 NOTE — Addendum Note (Signed)
Addended by: Andree CossHOWELL, Jahayra Mazo M on: 02/22/2015 05:30 PM   Modules accepted: Orders

## 2015-02-22 NOTE — Assessment & Plan Note (Signed)
Continues to f/u with psych. Greatly appreciate their eval.

## 2015-02-23 LAB — HIV-1 RNA QUANT-NO REFLEX-BLD
HIV 1 RNA QUANT: 192 {copies}/mL — AB (ref <20)
HIV-1 RNA Quant, Log: 2.28 {Log} — ABNORMAL HIGH (ref <1.30)

## 2015-02-23 LAB — T-HELPER CELL (CD4) - (RCID CLINIC ONLY)
CD4 T CELL HELPER: 20 % — AB (ref 33–55)
CD4 T Cell Abs: 380 /uL — ABNORMAL LOW (ref 400–2700)

## 2015-03-01 DIAGNOSIS — M545 Low back pain: Secondary | ICD-10-CM | POA: Diagnosis not present

## 2015-03-01 DIAGNOSIS — M79606 Pain in leg, unspecified: Secondary | ICD-10-CM | POA: Diagnosis not present

## 2015-03-01 DIAGNOSIS — R252 Cramp and spasm: Secondary | ICD-10-CM | POA: Diagnosis not present

## 2015-03-01 DIAGNOSIS — M797 Fibromyalgia: Secondary | ICD-10-CM | POA: Diagnosis not present

## 2015-03-30 DIAGNOSIS — M545 Low back pain: Secondary | ICD-10-CM | POA: Diagnosis not present

## 2015-03-30 DIAGNOSIS — R252 Cramp and spasm: Secondary | ICD-10-CM | POA: Diagnosis not present

## 2015-03-30 DIAGNOSIS — M797 Fibromyalgia: Secondary | ICD-10-CM | POA: Diagnosis not present

## 2015-03-30 DIAGNOSIS — M79606 Pain in leg, unspecified: Secondary | ICD-10-CM | POA: Diagnosis not present

## 2015-04-07 DIAGNOSIS — Z0000 Encounter for general adult medical examination without abnormal findings: Secondary | ICD-10-CM | POA: Diagnosis not present

## 2015-04-12 DIAGNOSIS — M25519 Pain in unspecified shoulder: Secondary | ICD-10-CM | POA: Diagnosis not present

## 2015-04-22 ENCOUNTER — Telehealth: Payer: Self-pay | Admitting: *Deleted
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
## 2015-04-28 ENCOUNTER — Telehealth: Payer: Self-pay | Admitting: *Deleted
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
## 2015-04-29 DIAGNOSIS — M545 Low back pain: Secondary | ICD-10-CM | POA: Diagnosis not present

## 2015-04-29 DIAGNOSIS — M797 Fibromyalgia: Secondary | ICD-10-CM | POA: Diagnosis not present

## 2015-04-29 DIAGNOSIS — R252 Cramp and spasm: Secondary | ICD-10-CM | POA: Diagnosis not present

## 2015-04-29 DIAGNOSIS — M79606 Pain in leg, unspecified: Secondary | ICD-10-CM | POA: Diagnosis not present

## 2015-05-04 ENCOUNTER — Ambulatory Visit: Payer: Medicare Other | Admitting: Internal Medicine

## 2015-05-12 DIAGNOSIS — F4312 Post-traumatic stress disorder, chronic: Secondary | ICD-10-CM | POA: Diagnosis not present

## 2015-05-12 DIAGNOSIS — G622 Polyneuropathy due to other toxic agents: Secondary | ICD-10-CM | POA: Diagnosis not present

## 2015-05-12 DIAGNOSIS — F4323 Adjustment disorder with mixed anxiety and depressed mood: Secondary | ICD-10-CM | POA: Diagnosis not present

## 2015-05-25 ENCOUNTER — Ambulatory Visit: Payer: Medicare Other | Admitting: Infectious Diseases

## 2015-06-25 DIAGNOSIS — M549 Dorsalgia, unspecified: Secondary | ICD-10-CM | POA: Diagnosis not present

## 2015-06-25 DIAGNOSIS — M5432 Sciatica, left side: Secondary | ICD-10-CM | POA: Diagnosis not present

## 2015-06-25 DIAGNOSIS — M791 Myalgia: Secondary | ICD-10-CM | POA: Diagnosis not present

## 2015-06-25 DIAGNOSIS — M5431 Sciatica, right side: Secondary | ICD-10-CM | POA: Diagnosis not present

## 2015-06-28 ENCOUNTER — Other Ambulatory Visit (HOSPITAL_COMMUNITY): Payer: Self-pay | Admitting: Rehabilitation

## 2015-06-28 DIAGNOSIS — M5432 Sciatica, left side: Secondary | ICD-10-CM

## 2015-06-30 ENCOUNTER — Ambulatory Visit (HOSPITAL_COMMUNITY): Payer: Medicare Other

## 2015-07-05 ENCOUNTER — Ambulatory Visit (INDEPENDENT_AMBULATORY_CARE_PROVIDER_SITE_OTHER): Payer: Medicare Other | Admitting: Infectious Diseases

## 2015-07-05 ENCOUNTER — Encounter: Payer: Self-pay | Admitting: Infectious Diseases

## 2015-07-05 VITALS — BP 119/83 | HR 98 | Temp 98.2°F | Wt 159.8 lb

## 2015-07-05 DIAGNOSIS — Z113 Encounter for screening for infections with a predominantly sexual mode of transmission: Secondary | ICD-10-CM | POA: Diagnosis not present

## 2015-07-05 DIAGNOSIS — B20 Human immunodeficiency virus [HIV] disease: Secondary | ICD-10-CM

## 2015-07-05 DIAGNOSIS — F319 Bipolar disorder, unspecified: Secondary | ICD-10-CM

## 2015-07-05 DIAGNOSIS — Z79899 Other long term (current) drug therapy: Secondary | ICD-10-CM

## 2015-07-05 LAB — COMPREHENSIVE METABOLIC PANEL
ALK PHOS: 52 U/L (ref 40–115)
ALT: 11 U/L (ref 9–46)
AST: 15 U/L (ref 10–40)
Albumin: 4.3 g/dL (ref 3.6–5.1)
BUN: 6 mg/dL — AB (ref 7–25)
CO2: 29 mmol/L (ref 20–31)
CREATININE: 0.85 mg/dL (ref 0.60–1.35)
Calcium: 9.8 mg/dL (ref 8.6–10.3)
Chloride: 105 mmol/L (ref 98–110)
GLUCOSE: 86 mg/dL (ref 65–99)
Potassium: 5 mmol/L (ref 3.5–5.3)
Sodium: 142 mmol/L (ref 135–146)
Total Bilirubin: 0.3 mg/dL (ref 0.2–1.2)
Total Protein: 6.8 g/dL (ref 6.1–8.1)

## 2015-07-05 LAB — CBC
HCT: 44.6 % (ref 39.0–52.0)
Hemoglobin: 14.8 g/dL (ref 13.0–17.0)
MCH: 30.6 pg (ref 26.0–34.0)
MCHC: 33.2 g/dL (ref 30.0–36.0)
MCV: 92.1 fL (ref 78.0–100.0)
MPV: 10.1 fL (ref 8.6–12.4)
PLATELETS: 210 10*3/uL (ref 150–400)
RBC: 4.84 MIL/uL (ref 4.22–5.81)
RDW: 16.4 % — ABNORMAL HIGH (ref 11.5–15.5)
WBC: 5.4 10*3/uL (ref 4.0–10.5)

## 2015-07-05 NOTE — Progress Notes (Signed)
   Subjective:    Patient ID: Randy Castillo, male    DOB: 1981-09-14, 34 y.o.   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 He was was started on genvoya 01-2015.   HIV 1 RNA QUANT (copies/mL)  Date Value  02/22/2015 192*  12/31/2014 [id number]*   CD4 T CELL ABS (/uL)  Date Value  02/22/2015 380*  12/31/2014 440   Has been doing well, taking care of his 39 yo son.  Taking art faithfully.  Has PCP and psy f/u. Feels like his mood has been stable.   Review of Systems  Constitutional: Negative for appetite change and unexpected weight change.  Respiratory: Negative for shortness of breath.   Gastrointestinal: Negative for diarrhea and constipation.  Genitourinary: Negative for difficulty urinating.  Neurological: Negative for headaches.  Psychiatric/Behavioral: Negative for dysphoric mood.       Objective:   Physical Exam  Constitutional: He appears well-developed and well-nourished.  HENT:  Mouth/Throat: No oropharyngeal exudate.  Eyes: EOM are normal. Pupils are equal, round, and reactive to light.  Neck: Neck supple.  Cardiovascular: Normal rate, regular rhythm and normal heart sounds.   Pulmonary/Chest: Effort normal and breath sounds normal.  Abdominal: Soft. Bowel sounds are normal. There is no tenderness.  Lymphadenopathy:    He has no cervical adenopathy.  Psychiatric: He has a normal mood and affect.          Assessment & Plan:

## 2015-07-05 NOTE — Assessment & Plan Note (Signed)
He is doing very well.  Will check his labs today.  Offered/refused condoms.  Will see him back in 6 months.

## 2015-07-05 NOTE — Assessment & Plan Note (Signed)
He is doing well. Appreciate his PCP and psy f/u.

## 2015-07-06 LAB — HIV-1 RNA QUANT-NO REFLEX-BLD
HIV 1 RNA Quant: <20 copies/mL (ref <20)
HIV-1 RNA Quant, Log: <1.3 {Log} (ref <1.30)

## 2015-07-06 LAB — SYPHILIS: RPR W/REFLEX TO RPR TITER AND TREPONEMAL ANTIBODIES, TRADITIONAL SCREENING AND DIAGNOSIS ALGORITHM

## 2015-07-07 ENCOUNTER — Ambulatory Visit (HOSPITAL_COMMUNITY)
Admission: RE | Admit: 2015-07-07 | Discharge: 2015-07-07 | Disposition: A | Discharge status: Home / Self Care | Payer: Medicare Other | Source: Ambulatory Visit | Attending: Rehabilitation | Admitting: Rehabilitation

## 2015-07-07 DIAGNOSIS — M5126 Other intervertebral disc displacement, lumbar region: Secondary | ICD-10-CM | POA: Diagnosis not present

## 2015-07-07 DIAGNOSIS — M79661 Pain in right lower leg: Secondary | ICD-10-CM | POA: Diagnosis not present

## 2015-07-07 DIAGNOSIS — M545 Low back pain: Secondary | ICD-10-CM | POA: Diagnosis present

## 2015-07-07 DIAGNOSIS — M79662 Pain in left lower leg: Secondary | ICD-10-CM | POA: Insufficient documentation

## 2015-07-07 DIAGNOSIS — R200 Anesthesia of skin: Secondary | ICD-10-CM | POA: Insufficient documentation

## 2015-07-07 DIAGNOSIS — M5136 Other intervertebral disc degeneration, lumbar region: Secondary | ICD-10-CM | POA: Insufficient documentation

## 2015-07-07 DIAGNOSIS — M5432 Sciatica, left side: Secondary | ICD-10-CM

## 2015-08-07 DIAGNOSIS — Z23 Encounter for immunization: Secondary | ICD-10-CM | POA: Diagnosis not present

## 2015-08-14 ENCOUNTER — Emergency Department (HOSPITAL_COMMUNITY): Payer: Medicare Other

## 2015-08-14 ENCOUNTER — Emergency Department (HOSPITAL_COMMUNITY)
Admission: EM | Admit: 2015-08-14 | Discharge: 2015-08-14 | Disposition: A | Discharge status: Home / Self Care | Payer: Medicare Other | Attending: Emergency Medicine | Admitting: Emergency Medicine

## 2015-08-14 ENCOUNTER — Encounter (HOSPITAL_COMMUNITY): Payer: Self-pay | Admitting: Emergency Medicine

## 2015-08-14 DIAGNOSIS — R531 Weakness: Secondary | ICD-10-CM | POA: Diagnosis not present

## 2015-08-14 DIAGNOSIS — J45909 Unspecified asthma, uncomplicated: Secondary | ICD-10-CM | POA: Insufficient documentation

## 2015-08-14 DIAGNOSIS — L98499 Non-pressure chronic ulcer of skin of other sites with unspecified severity: Secondary | ICD-10-CM | POA: Diagnosis not present

## 2015-08-14 DIAGNOSIS — G40909 Epilepsy, unspecified, not intractable, without status epilepticus: Secondary | ICD-10-CM | POA: Insufficient documentation

## 2015-08-14 DIAGNOSIS — Z79899 Other long term (current) drug therapy: Secondary | ICD-10-CM | POA: Insufficient documentation

## 2015-08-14 DIAGNOSIS — Z8619 Personal history of other infectious and parasitic diseases: Secondary | ICD-10-CM | POA: Insufficient documentation

## 2015-08-14 DIAGNOSIS — J029 Acute pharyngitis, unspecified: Secondary | ICD-10-CM | POA: Diagnosis not present

## 2015-08-14 DIAGNOSIS — G43909 Migraine, unspecified, not intractable, without status migrainosus: Secondary | ICD-10-CM | POA: Insufficient documentation

## 2015-08-14 DIAGNOSIS — K088 Other specified disorders of teeth and supporting structures: Secondary | ICD-10-CM | POA: Insufficient documentation

## 2015-08-14 DIAGNOSIS — Z720 Tobacco use: Secondary | ICD-10-CM | POA: Insufficient documentation

## 2015-08-14 DIAGNOSIS — R599 Enlarged lymph nodes, unspecified: Secondary | ICD-10-CM | POA: Insufficient documentation

## 2015-08-14 DIAGNOSIS — R079 Chest pain, unspecified: Secondary | ICD-10-CM | POA: Diagnosis not present

## 2015-08-14 DIAGNOSIS — F419 Anxiety disorder, unspecified: Secondary | ICD-10-CM | POA: Diagnosis not present

## 2015-08-14 DIAGNOSIS — J069 Acute upper respiratory infection, unspecified: Secondary | ICD-10-CM | POA: Diagnosis not present

## 2015-08-14 DIAGNOSIS — I10 Essential (primary) hypertension: Secondary | ICD-10-CM | POA: Insufficient documentation

## 2015-08-14 DIAGNOSIS — M47812 Spondylosis without myelopathy or radiculopathy, cervical region: Secondary | ICD-10-CM | POA: Diagnosis not present

## 2015-08-14 DIAGNOSIS — R000 Tachycardia, unspecified: Secondary | ICD-10-CM | POA: Insufficient documentation

## 2015-08-14 DIAGNOSIS — Z21 Asymptomatic human immunodeficiency virus [HIV] infection status: Secondary | ICD-10-CM | POA: Insufficient documentation

## 2015-08-14 DIAGNOSIS — R05 Cough: Secondary | ICD-10-CM | POA: Diagnosis not present

## 2015-08-14 DIAGNOSIS — F17210 Nicotine dependence, cigarettes, uncomplicated: Secondary | ICD-10-CM | POA: Diagnosis not present

## 2015-08-14 LAB — CBC WITH DIFFERENTIAL/PLATELET
BASOS PCT: 0 % (ref 0–1)
Basophils Absolute: 0 10*3/uL (ref 0.0–0.1)
Eosinophils Absolute: 0.2 10*3/uL (ref 0.0–0.7)
Eosinophils Relative: 2 % (ref 0–5)
HEMATOCRIT: 38.4 % — AB (ref 39.0–52.0)
HEMOGLOBIN: 12.9 g/dL — AB (ref 13.0–17.0)
LYMPHS ABS: 2.2 10*3/uL (ref 0.7–4.0)
Lymphocytes Relative: 23 % (ref 12–46)
MCH: 32.4 pg (ref 26.0–34.0)
MCHC: 33.6 g/dL (ref 30.0–36.0)
MCV: 96.5 fL (ref 78.0–100.0)
MONOS PCT: 11 % (ref 3–12)
Monocytes Absolute: 1 10*3/uL (ref 0.1–1.0)
NEUTROS ABS: 6 10*3/uL (ref 1.7–7.7)
NEUTROS PCT: 64 % (ref 43–77)
Platelets: 173 10*3/uL (ref 150–400)
RBC: 3.98 MIL/uL — ABNORMAL LOW (ref 4.22–5.81)
RDW: 14.3 % (ref 11.5–15.5)
WBC: 9.4 10*3/uL (ref 4.0–10.5)

## 2015-08-14 LAB — COMPREHENSIVE METABOLIC PANEL
ALBUMIN: 3.7 g/dL (ref 3.5–5.0)
ALK PHOS: 65 U/L (ref 38–126)
ALT: 20 U/L (ref 17–63)
ANION GAP: 7 (ref 5–15)
AST: 26 U/L (ref 15–41)
BILIRUBIN TOTAL: 0.3 mg/dL (ref 0.3–1.2)
BUN: 10 mg/dL (ref 6–20)
CALCIUM: 9 mg/dL (ref 8.9–10.3)
CO2: 29 mmol/L (ref 22–32)
CREATININE: 0.92 mg/dL (ref 0.61–1.24)
Chloride: 103 mmol/L (ref 101–111)
GFR calc Af Amer: >60 mL/min (ref >60)
GFR calc non Af Amer: >60 mL/min (ref >60)
GLUCOSE: 99 mg/dL (ref 65–99)
Potassium: 4 mmol/L (ref 3.5–5.1)
Sodium: 139 mmol/L (ref 135–145)
TOTAL PROTEIN: 7.1 g/dL (ref 6.5–8.1)

## 2015-08-14 LAB — RAPID STREP SCREEN (MED CTR MEBANE ONLY): Streptococcus, Group A Screen (Direct): NEGATIVE

## 2015-08-14 MED ORDER — FENTANYL CITRATE (PF) 100 MCG/2ML IJ SOLN
100.0000 ug | Freq: Once | INTRAMUSCULAR | Status: AC
  Administered 2015-08-14: 100 ug via INTRAVENOUS
  Filled 2015-08-14: qty 2

## 2015-08-14 MED ORDER — KETOROLAC TROMETHAMINE 30 MG/ML IJ SOLN
30.0000 mg | Freq: Once | INTRAMUSCULAR | Status: AC
  Administered 2015-08-14: 30 mg via INTRAVENOUS
  Filled 2015-08-14: qty 1

## 2015-08-14 MED ORDER — SODIUM CHLORIDE 0.9 % IV BOLUS (SEPSIS)
1000.0000 mL | Freq: Once | INTRAVENOUS | Status: AC
  Administered 2015-08-14: 1000 mL via INTRAVENOUS

## 2015-08-14 NOTE — ED Provider Notes (Signed)
CSN: [id number]     Arrival date & time 08/14/15  1518 History   First MD Initiated Contact with Patient 08/14/15 1538     Chief Complaint  Patient presents with  . Weakness     (Consider location/radiation/quality/duration/timing/severity/associated sxs/prior Treatment) Patient is a 34 y.o. male presenting with weakness. The history is provided by the patient.  Weakness This is a new problem. Associated symptoms include chest pain.   patient has felt bad for the last few days. Has had fevers and chills. Poorly has had a little bit of a cough. Has had a sore throat. He has known HIV positive and sees infectious disease. States he has a sore throat. He now has a high-pitched voice also. He is speaking quietly also. States it has been this way for last few days. Patient's brother reportedly had a cough. States he has bumps and white stuff in the back of his throat. He states he has had pain in his left jaw also. Face is reportedly been swollen also. Patient states this does not feel like dental pain.  Past Medical History  Diagnosis Date  . Herpes   . Gonorrhea in male   . Asthma   . Environmental allergies   . Bipolar affective disorder, manic, in partial or remission   . Migraine   . Hypertension   . Seizures   . Anxiety   . Neuropathic pain   . HIV disease    Past Surgical History  Procedure Laterality Date  . Cyst excision     Family History  Problem Relation Age of Onset  . Family history unknown: Yes   Social History  Substance Use Topics  . Smoking status: Current Every Day Smoker -- 0.75 packs/day for 16 years    Types: Cigarettes  . Smokeless tobacco: Never Used  . Alcohol Use: No    Review of Systems  Constitutional: Positive for fever, appetite change and fatigue.  HENT: Positive for dental problem, facial swelling, sore throat and voice change. Negative for postnasal drip.   Eyes: Negative for pain.  Respiratory: Positive for cough. Negative for chest  tightness.   Cardiovascular: Positive for chest pain.  Genitourinary: Negative for flank pain.  Skin: Positive for wound. Negative for pallor and rash.        5 mm ulcer to right forearm on the palmar aspect.  Neurological: Positive for weakness.  Hematological: Positive for adenopathy.  Psychiatric/Behavioral: Negative for behavioral problems.      Allergies  Darvocet; Methocarbamol; and Morphine and related  Home Medications   Prior to Admission medications   Medication Sig Start Date End Date Taking? Authorizing Provider  albuterol (PROVENTIL HFA;VENTOLIN HFA) 108 (90 BASE) MCG/ACT inhaler Inhale 2 puffs into the lungs every 6 (six) hours as needed for wheezing.   Yes Historical Provider, MD  alprazolam Prudy Feeler) 2 MG tablet Take 1 mg by mouth 3 (three) times daily as needed for sleep or anxiety.    Yes Historical Provider, MD  amphetamine-dextroamphetamine (ADDERALL) 20 MG tablet Take 20 mg by mouth 4 (four) times daily.   Yes Historical Provider, MD  atomoxetine (STRATTERA) 60 MG capsule Take 60 mg by mouth daily.   Yes Historical Provider, MD  elvitegravir-cobicistat-emtricitabine-tenofovir (GENVOYA) 150-150-200-10 MG TABS tablet Take 1 tablet by mouth daily with breakfast. 01/26/15  Yes Ginnie Smart, MD  gabapentin (NEURONTIN) 600 MG tablet Take 600 mg by mouth 4 (four) times daily as needed. 06/16/15  Yes Historical Provider, MD  ibuprofen (ADVIL,MOTRIN) 200  MG tablet Take 800 mg by mouth every 6 (six) hours as needed for mild pain or moderate pain.   Yes Historical Provider, MD  OLANZapine-FLUoxetine (SYMBYAX) 12-25 MG per capsule Take 1 capsule by mouth every evening.   Yes Historical Provider, MD  pregabalin (LYRICA) 100 MG capsule Take 100-200 mg by mouth 2 (two) times daily. Takes 1 capsule in the morning and 2 capsules at bedtime   Yes Historical Provider, MD  valACYclovir (VALTREX) 1000 MG tablet Take 1,000 mg by mouth daily.   Yes Historical Provider, MD   BP 122/85  mmHg  Pulse 105  Temp(Src) 98.4 F (36.9 C) (Oral)  Ht 5\' 11"  (1.803 m)  Wt 180 lb (81.647 kg)  BMI 25.12 kg/m2  SpO2 90% Physical Exam  Constitutional: He is oriented to person, place, and time. He appears well-developed.  HENT:  Head: Atraumatic.  Some white exudate in throat. Uvula midline. No asymmetric swelling. High-pitched voice. Some cavities on left lower jaw. No fluctuance or tenderness on jaw. No facial swelling.  Eyes: Pupils are equal, round, and reactive to light.  Neck: No thyromegaly present.  Cardiovascular:  Tachycardia  Pulmonary/Chest:  Mildly harsh breath sounds  Abdominal: Bowel sounds are normal. There is no tenderness.  Musculoskeletal: Normal range of motion.  Lymphadenopathy:    He has cervical adenopathy.  Neurological: He is alert and oriented to person, place, and time.  Skin: Skin is warm.    ED Course  Procedures (including critical care time) Labs Review Labs Reviewed  CBC WITH DIFFERENTIAL/PLATELET - Abnormal; Notable for the following:    RBC 3.98 (*)    Hemoglobin 12.9 (*)    HCT 38.4 (*)    All other components within normal limits  RAPID STREP SCREEN (NOT AT [facility])  CULTURE, GROUP A STREP  COMPREHENSIVE METABOLIC PANEL    Imaging Review Dg Neck Soft Tissue  08/14/2015   CLINICAL DATA:  35 year old male with sore throat, dizziness, nausea, productive cough, weakness. HIV positive. Initial encounter.  EXAM: NECK SOFT TISSUES - 1+ VIEW  COMPARISON:  Cervical spine CT 04/08/2012.  FINDINGS: Mildly improved cervical lordosis. Chronic C6-C7 disc and endplate degeneration. Normal prevertebral soft tissue contour. Pharyngeal contours are within normal limits. Epiglottis contour is normal. Visualized tracheal air column is within normal limits. Negative lung apices. Suggestion of mild thoracic scoliosis.  IMPRESSION: 1. Negative radiographic appearance of the neck soft tissues. 2. Chronic disc and endplate degeneration at C6-C7.   Electronically  Signed   By: Odessa Fleming M.D.   On: 08/14/2015 16:12   Dg Chest 2 View  08/14/2015   CLINICAL DATA:  Weakness  EXAM: CHEST  2 VIEW  COMPARISON:  04/21/2012  FINDINGS: Heart size and vascularity normal. Lung volume normal. Lungs are clear without infiltrate effusion or mass lesion.  IMPRESSION: No active cardiopulmonary disease.   Electronically Signed   By: Marlan Palau M.D.   On: 08/14/2015 16:11   I have personally reviewed and evaluated these images and lab results as part of my medical decision-making.   EKG Interpretation None      MDM   Final diagnoses:  URI (upper respiratory infection)    Patient with sore throat. Has posterior pharynx with some exudate. Negative soft tissue Philip neck. Doubt abscess or epiglottitis. Will discharge home. Patient is HIV positive but does not have AIDS.    Benjiman Core, MD 08/14/15 2202

## 2015-08-14 NOTE — ED Notes (Signed)
PT c/o generalized weakness, dizziness, sore throat, nausea, and productive yellow/brown sputum cough. PT states he is HIV positive.

## 2015-08-14 NOTE — Discharge Instructions (Signed)

## 2015-08-17 LAB — CULTURE, GROUP A STREP

## 2015-08-18 DIAGNOSIS — I10 Essential (primary) hypertension: Secondary | ICD-10-CM | POA: Diagnosis not present

## 2015-08-18 DIAGNOSIS — F4323 Adjustment disorder with mixed anxiety and depressed mood: Secondary | ICD-10-CM | POA: Diagnosis not present

## 2015-09-27 ENCOUNTER — Emergency Department (HOSPITAL_COMMUNITY)
Admission: EM | Admit: 2015-09-27 | Discharge: 2015-09-27 | Disposition: A | Discharge status: Home / Self Care | Payer: Medicare Other | Attending: Emergency Medicine | Admitting: Emergency Medicine

## 2015-09-27 ENCOUNTER — Encounter (HOSPITAL_COMMUNITY): Payer: Self-pay | Admitting: Emergency Medicine

## 2015-09-27 ENCOUNTER — Emergency Department (HOSPITAL_COMMUNITY): Payer: Medicare Other

## 2015-09-27 DIAGNOSIS — M5432 Sciatica, left side: Secondary | ICD-10-CM | POA: Diagnosis not present

## 2015-09-27 DIAGNOSIS — G43909 Migraine, unspecified, not intractable, without status migrainosus: Secondary | ICD-10-CM | POA: Diagnosis not present

## 2015-09-27 DIAGNOSIS — F419 Anxiety disorder, unspecified: Secondary | ICD-10-CM | POA: Insufficient documentation

## 2015-09-27 DIAGNOSIS — J45909 Unspecified asthma, uncomplicated: Secondary | ICD-10-CM | POA: Insufficient documentation

## 2015-09-27 DIAGNOSIS — K0381 Cracked tooth: Secondary | ICD-10-CM | POA: Insufficient documentation

## 2015-09-27 DIAGNOSIS — Z79899 Other long term (current) drug therapy: Secondary | ICD-10-CM | POA: Insufficient documentation

## 2015-09-27 DIAGNOSIS — Z21 Asymptomatic human immunodeficiency virus [HIV] infection status: Secondary | ICD-10-CM | POA: Diagnosis not present

## 2015-09-27 DIAGNOSIS — Z8619 Personal history of other infectious and parasitic diseases: Secondary | ICD-10-CM | POA: Diagnosis not present

## 2015-09-27 DIAGNOSIS — G629 Polyneuropathy, unspecified: Secondary | ICD-10-CM | POA: Diagnosis not present

## 2015-09-27 DIAGNOSIS — G40909 Epilepsy, unspecified, not intractable, without status epilepticus: Secondary | ICD-10-CM | POA: Diagnosis not present

## 2015-09-27 DIAGNOSIS — K047 Periapical abscess without sinus: Secondary | ICD-10-CM | POA: Diagnosis not present

## 2015-09-27 DIAGNOSIS — Z720 Tobacco use: Secondary | ICD-10-CM | POA: Diagnosis not present

## 2015-09-27 DIAGNOSIS — M545 Low back pain: Secondary | ICD-10-CM | POA: Diagnosis not present

## 2015-09-27 DIAGNOSIS — F3173 Bipolar disorder, in partial remission, most recent episode manic: Secondary | ICD-10-CM | POA: Insufficient documentation

## 2015-09-27 DIAGNOSIS — I10 Essential (primary) hypertension: Secondary | ICD-10-CM | POA: Insufficient documentation

## 2015-09-27 DIAGNOSIS — K0889 Other specified disorders of teeth and supporting structures: Secondary | ICD-10-CM | POA: Diagnosis present

## 2015-09-27 MED ORDER — HYDROCODONE-ACETAMINOPHEN 5-325 MG PO TABS
1.0000 | ORAL_TABLET | Freq: Four times a day (QID) | ORAL | Status: DC | PRN
Start: 2015-09-27 — End: 2016-02-07

## 2015-09-27 MED ORDER — NAPROXEN 500 MG PO TABS: 500.0000 mg | ORAL_TABLET | Freq: Two times a day (BID) | ORAL | Status: DC

## 2015-09-27 MED ORDER — HYDROMORPHONE HCL 1 MG/ML IJ SOLN
1.0000 mg | Freq: Once | INTRAMUSCULAR | Status: AC
  Administered 2015-09-27: 1 mg via INTRAMUSCULAR
  Filled 2015-09-27: qty 1

## 2015-09-27 MED ORDER — OXYCODONE-ACETAMINOPHEN 5-325 MG PO TABS
1.0000 | ORAL_TABLET | Freq: Once | ORAL | Status: AC
  Administered 2015-09-27: 1 via ORAL
  Filled 2015-09-27: qty 1

## 2015-09-27 MED ORDER — PENICILLIN V POTASSIUM 500 MG PO TABS: 500.0000 mg | ORAL_TABLET | Freq: Four times a day (QID) | ORAL | Status: AC

## 2015-09-27 NOTE — ED Notes (Signed)
Patient transported to X-ray 

## 2015-09-27 NOTE — Discharge Instructions (Signed)
Follow up with your family md this week °

## 2015-09-27 NOTE — ED Notes (Signed)
Pt c/o of LT lower dental pain and lower back pain. Pt states he was mowing his yard today and thinks he may have strained his back. Pt states pain shoots down his legs with movement. Pt states pain has improved since injury occurred.

## 2015-09-27 NOTE — ED Notes (Signed)
Pt verbalized understanding of no driving and to use caution within 4 hours of taking pain meds due to meds cause drowsiness 

## 2015-09-27 NOTE — ED Notes (Signed)
Instructed pt to take all of antibiotics as prescribed. 

## 2015-09-27 NOTE — ED Provider Notes (Signed)
CSN: [id number]     Arrival date & time 09/27/15  1652 History   First MD Initiated Contact with Patient 09/27/15 1709     Chief Complaint  Patient presents with  . Back Pain  . Dental Pain     (Consider location/radiation/quality/duration/timing/severity/associated sxs/prior Treatment) Patient is a 34 y.o. male presenting with back pain and tooth pain. The history is provided by the patient (Patient states that he's been on long today now has pain in his back and down his left leg. He also states that he has pain in his mouth from abscessed tooth).  Back Pain Location:  Generalized Quality:  Aching Radiates to:  L posterior upper leg Pain severity:  Moderate Onset quality:  Sudden Timing:  Constant Progression:  Waxing and waning Chronicity:  New Context: not emotional stress   Associated symptoms: no abdominal pain, no chest pain and no headaches   Dental Pain Associated symptoms: no congestion and no headaches     Past Medical History  Diagnosis Date  . Herpes   . Gonorrhea in male   . Asthma   . Environmental allergies   . Bipolar affective disorder, manic, in partial or remission (HCC)   . Migraine   . Hypertension   . Seizures (HCC)   . Anxiety   . Neuropathic pain   . HIV disease [facility](HCC)    Past Surgical History  Procedure Laterality Date  . Cyst excision     Family History  Problem Relation Age of Onset  . Family history unknown: Yes   Social History  Substance Use Topics  . Smoking status: Current Every Day Smoker -- 0.75 packs/day for 16 years    Types: Cigarettes  . Smokeless tobacco: Never Used  . Alcohol Use: No    Review of Systems  Constitutional: Negative for appetite change and fatigue.  HENT: Negative for congestion, ear discharge and sinus pressure.        Pain in tooth  Eyes: Negative for discharge.  Respiratory: Negative for cough.   Cardiovascular: Negative for chest pain.  Gastrointestinal: Negative for abdominal pain and diarrhea.   Genitourinary: Negative for frequency and hematuria.  Musculoskeletal: Positive for back pain.  Skin: Negative for rash.  Neurological: Negative for seizures and headaches.  Psychiatric/Behavioral: Negative for hallucinations.      Allergies  Darvocet and Morphine and related  Home Medications   Prior to Admission medications   Medication Sig Start Date End Date Taking? Authorizing Provider  albuterol (PROVENTIL HFA;VENTOLIN HFA) 108 (90 BASE) MCG/ACT inhaler Inhale 2 puffs into the lungs every 6 (six) hours as needed for wheezing.   Yes Historical Provider, MD  alprazolam Prudy Feeler(XANAX) 2 MG tablet Take 1 mg by mouth 3 (three) times daily as needed for sleep or anxiety.    Yes Historical Provider, MD  amphetamine-dextroamphetamine (ADDERALL) 20 MG tablet Take 20 mg by mouth 4 (four) times daily.   Yes Historical Provider, MD  elvitegravir-cobicistat-emtricitabine-tenofovir (GENVOYA) 150-150-200-10 MG TABS tablet Take 1 tablet by mouth daily with breakfast. 01/26/15  Yes Ginnie SmartJeffrey C Hatcher, MD  gabapentin (NEURONTIN) 600 MG tablet Take 600 mg by mouth 4 (four) times daily as needed (for neuropathy/pain).  06/16/15  Yes Historical Provider, MD  ibuprofen (ADVIL,MOTRIN) 200 MG tablet Take 800 mg by mouth every 6 (six) hours as needed for mild pain or moderate pain.   Yes Historical Provider, MD  lisinopril-hydrochlorothiazide (PRINZIDE,ZESTORETIC) 10-12.5 MG tablet Take 1 tablet by mouth daily. 08/18/15  Yes Historical Provider, MD  OLANZapine-FLUoxetine (SYMBYAX) 12-25 MG per capsule Take 1 capsule by mouth every evening.   Yes Historical Provider, MD  pregabalin (LYRICA) 100 MG capsule Take 100-200 mg by mouth 2 (two) times daily as needed (for back pain). Takes 1 capsule in the morning and 2 capsules at bedtime   Yes Historical Provider, MD  valACYclovir (VALTREX) 1000 MG tablet Take 1,000 mg by mouth daily. *MAY TAKE ONE TABLET IN ADDITION AS NEEDED   Yes Historical Provider, MD   HYDROcodone-acetaminophen (NORCO/VICODIN) 5-325 MG tablet Take 1 tablet by mouth every 6 (six) hours as needed. 09/27/15   Bethann Berkshire, MD  naproxen (NAPROSYN) 500 MG tablet Take 1 tablet (500 mg total) by mouth 2 (two) times daily. 09/27/15   Bethann Berkshire, MD  penicillin v potassium (VEETID) 500 MG tablet Take 1 tablet (500 mg total) by mouth 4 (four) times daily. 09/27/15 10/04/15  Bethann Berkshire, MD   BP 135/99 mmHg  Pulse 117  Temp(Src) 98.4 F (36.9 C) (Oral)  Resp 20  Ht  (1.778 m)  Wt 170 lb (77.111 kg)  BMI 24.39 kg/m2  SpO2 99% Physical Exam  Constitutional: He is oriented to person, place, and time. He appears well-developed.  HENT:  Head: Normocephalic.  Patient has a tooth broken left lower molar  Eyes: Conjunctivae and EOM are normal. No scleral icterus.  Neck: Neck supple. No thyromegaly present.  Cardiovascular: Normal rate and regular rhythm.  Exam reveals no gallop and no friction rub.   No murmur heard. Pulmonary/Chest: No stridor. He has no wheezes. He has no rales. He exhibits no tenderness.  Abdominal: He exhibits no distension. There is no tenderness. There is no rebound.  Musculoskeletal: Normal range of motion. He exhibits no edema.  Lymphadenopathy:    He has no cervical adenopathy.  Neurological: He is oriented to person, place, and time. He exhibits normal muscle tone. Coordination normal.  Lower back pain and tenderness with positive straight leg raise on left  Skin: No rash noted. No erythema.  Psychiatric: He has a normal mood and affect. His behavior is normal.    ED Course  Procedures (including critical care time) Labs Review Labs Reviewed - No data to display  Imaging Review Dg Lumbar Spine Complete  09/27/2015  CLINICAL DATA:  Pain after feeling pop in back today. Initial encounter. 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 No  fracture, subluxation, or focal bone lesion. No disc narrowing. IMPRESSION: 1. Negative. 2. Transitional lumbosacral anatomy. Electronically Signed   By: Marnee Spring M.D.   On: 09/27/2015 18:19   I have personally reviewed and evaluated these images and lab results as part of my medical decision-making.   EKG Interpretation None      MDM   Final diagnoses:  Sciatica of left side  Abscessed tooth   Patient with sciatica and analysis to see will be treated with Naprosyn and Vicodin and penicillin to follow-up with family doctor this week and has plans to see an oral surgeon Soon     Bethann Berkshire, MD 09/27/15 1952

## 2015-10-06 DIAGNOSIS — I10 Essential (primary) hypertension: Secondary | ICD-10-CM | POA: Diagnosis not present

## 2015-10-06 DIAGNOSIS — F4323 Adjustment disorder with mixed anxiety and depressed mood: Secondary | ICD-10-CM | POA: Diagnosis not present

## 2015-10-06 DIAGNOSIS — M25519 Pain in unspecified shoulder: Secondary | ICD-10-CM | POA: Diagnosis not present

## 2015-10-06 DIAGNOSIS — Z23 Encounter for immunization: Secondary | ICD-10-CM | POA: Diagnosis not present

## 2015-12-28 ENCOUNTER — Other Ambulatory Visit: Payer: Self-pay | Admitting: Infectious Diseases

## 2015-12-30 ENCOUNTER — Other Ambulatory Visit: Payer: Medicare Other

## 2015-12-30 ENCOUNTER — Ambulatory Visit (INDEPENDENT_AMBULATORY_CARE_PROVIDER_SITE_OTHER): Payer: Medicare Other | Admitting: *Deleted

## 2015-12-30 DIAGNOSIS — Z79899 Other long term (current) drug therapy: Secondary | ICD-10-CM

## 2015-12-30 DIAGNOSIS — B20 Human immunodeficiency virus [HIV] disease: Secondary | ICD-10-CM

## 2015-12-30 DIAGNOSIS — Z113 Encounter for screening for infections with a predominantly sexual mode of transmission: Secondary | ICD-10-CM

## 2015-12-30 DIAGNOSIS — Z23 Encounter for immunization: Secondary | ICD-10-CM

## 2015-12-30 LAB — COMPREHENSIVE METABOLIC PANEL
ALK PHOS: 153 U/L — AB (ref 40–115)
ALT: 1870 U/L — AB (ref 9–46)
AST: 939 U/L — AB (ref 10–40)
Albumin: 4.1 g/dL (ref 3.6–5.1)
BILIRUBIN TOTAL: 1.4 mg/dL — AB (ref 0.2–1.2)
BUN: 16 mg/dL (ref 7–25)
CO2: 28 mmol/L (ref 20–31)
CREATININE: 1 mg/dL (ref 0.60–1.35)
Calcium: 9.7 mg/dL (ref 8.6–10.3)
Chloride: 101 mmol/L (ref 98–110)
GLUCOSE: 88 mg/dL (ref 65–99)
Potassium: 3.8 mmol/L (ref 3.5–5.3)
SODIUM: 138 mmol/L (ref 135–146)
TOTAL PROTEIN: 7 g/dL (ref 6.1–8.1)

## 2015-12-30 LAB — LIPID PANEL
CHOL/HDL RATIO: 5.3 ratio — AB (ref <=5.0)
CHOLESTEROL: 180 mg/dL (ref 125–200)
HDL: 34 mg/dL — ABNORMAL LOW (ref >=40)
LDL Cholesterol: 124 mg/dL (ref <130)
Triglycerides: 111 mg/dL (ref <150)
VLDL: 22 mg/dL (ref <30)

## 2015-12-30 LAB — CBC
HCT: 46.1 % (ref 39.0–52.0)
Hemoglobin: 15.6 g/dL (ref 13.0–17.0)
MCH: 32 pg (ref 26.0–34.0)
MCHC: 33.8 g/dL (ref 30.0–36.0)
MCV: 94.7 fL (ref 78.0–100.0)
MPV: 11.1 fL (ref 8.6–12.4)
PLATELETS: 198 10*3/uL (ref 150–400)
RBC: 4.87 MIL/uL (ref 4.22–5.81)
RDW: 14.7 % (ref 11.5–15.5)
WBC: 5.2 10*3/uL (ref 4.0–10.5)

## 2015-12-30 NOTE — Progress Notes (Signed)
Needing f/u MD appt, wants to change to Dr. Daiva Eves.  Made pt an appt with Dr. Daiva Eves.  Starting a relationship and wants to bring partner to visit.  Partner if HIV -.  RN encouraged pt to bring partner to OV on 01/13/16 with Dr Daiva Eves to discuss PReP.  Pt will bring partner.  Pt arranged Medicaid transportation for the visit.

## 2015-12-31 ENCOUNTER — Other Ambulatory Visit: Payer: Self-pay | Admitting: Infectious Diseases

## 2015-12-31 ENCOUNTER — Other Ambulatory Visit: Payer: Self-pay

## 2015-12-31 DIAGNOSIS — R7989 Other specified abnormal findings of blood chemistry: Secondary | ICD-10-CM

## 2015-12-31 DIAGNOSIS — R899 Unspecified abnormal finding in specimens from other organs, systems and tissues: Secondary | ICD-10-CM

## 2015-12-31 DIAGNOSIS — R945 Abnormal results of liver function studies: Secondary | ICD-10-CM

## 2015-12-31 LAB — RPR

## 2015-12-31 LAB — HIV-1 RNA QUANT-NO REFLEX-BLD

## 2015-12-31 LAB — T-HELPER CELL (CD4) - (RCID CLINIC ONLY)
CD4 % Helper T Cell: 32 % — ABNORMAL LOW (ref 33–55)
CD4 T Cell Abs: 800 /uL (ref 400–2700)

## 2015-12-31 NOTE — Progress Notes (Unsigned)
Per Dr. Ninetta Lights patient was advised to return ASAP for repeat liver function testing ASAP. Patient states he is not able to come on Monday since he uses medicaid transportation.  He was advised to call the transportation office and inform them of the urgent need and they will call our office to confirm.   He has agreed and will call to set up appointment.   Lab visit only.  He is not able to come on Monday, January 23,2016 due to a previously scheduled event.  He has agreed to come on Tuesday, January 03, 2015 .   Also patient requested transfer to Dr Daiva Eves but has not reason for transferring other than he heard he was "a good doctor".  I explained this does not warrant transfer and he can have a discussion with Dr Ninetta Lights at the next visit which I scheduled for February, 2017.  Laurell Josephs, RN

## 2016-01-04 ENCOUNTER — Other Ambulatory Visit: Payer: Medicare Other

## 2016-01-04 DIAGNOSIS — R945 Abnormal results of liver function studies: Secondary | ICD-10-CM

## 2016-01-04 DIAGNOSIS — R7989 Other specified abnormal findings of blood chemistry: Secondary | ICD-10-CM | POA: Diagnosis not present

## 2016-01-04 DIAGNOSIS — R799 Abnormal finding of blood chemistry, unspecified: Secondary | ICD-10-CM | POA: Diagnosis not present

## 2016-01-04 LAB — COMPLETE METABOLIC PANEL WITH GFR
ALK PHOS: 122 U/L — AB (ref 40–115)
ALT: 560 U/L — AB (ref 9–46)
AST: 57 U/L — ABNORMAL HIGH (ref 10–40)
Albumin: 4.1 g/dL (ref 3.6–5.1)
BILIRUBIN TOTAL: 0.6 mg/dL (ref 0.2–1.2)
BUN: 10 mg/dL (ref 7–25)
CALCIUM: 9.3 mg/dL (ref 8.6–10.3)
CO2: 30 mmol/L (ref 20–31)
CREATININE: 0.97 mg/dL (ref 0.60–1.35)
Chloride: 101 mmol/L (ref 98–110)
GFR, Est Non African American: >89 mL/min (ref >=60)
Glucose, Bld: 94 mg/dL (ref 65–99)
Potassium: 4.6 mmol/L (ref 3.5–5.3)
Sodium: 136 mmol/L (ref 135–146)
TOTAL PROTEIN: 7 g/dL (ref 6.1–8.1)

## 2016-01-05 ENCOUNTER — Telehealth: Payer: Self-pay

## 2016-01-05 DIAGNOSIS — R7989 Other specified abnormal findings of blood chemistry: Secondary | ICD-10-CM | POA: Diagnosis not present

## 2016-01-05 NOTE — Telephone Encounter (Signed)
Patient came back for lab re-draw for CMP.  He is calling for results.  Please advise .  Patient very anxious.   Laurell Josephs, RN

## 2016-01-06 ENCOUNTER — Encounter: Payer: Self-pay | Admitting: Infectious Diseases

## 2016-01-09 ENCOUNTER — Encounter: Payer: Self-pay | Admitting: Infectious Diseases

## 2016-01-10 ENCOUNTER — Ambulatory Visit: Payer: Medicare Other | Admitting: Infectious Disease

## 2016-01-11 ENCOUNTER — Ambulatory Visit: Payer: Medicare Other

## 2016-01-11 ENCOUNTER — Other Ambulatory Visit: Payer: Medicare Other

## 2016-01-13 ENCOUNTER — Ambulatory Visit: Payer: Medicare Other | Admitting: Infectious Disease

## 2016-01-14 ENCOUNTER — Ambulatory Visit: Payer: Medicare Other

## 2016-01-21 ENCOUNTER — Ambulatory Visit: Payer: Medicare Other

## 2016-01-21 ENCOUNTER — Other Ambulatory Visit (INDEPENDENT_AMBULATORY_CARE_PROVIDER_SITE_OTHER): Payer: Medicare Other

## 2016-01-21 DIAGNOSIS — B20 Human immunodeficiency virus [HIV] disease: Secondary | ICD-10-CM

## 2016-01-21 DIAGNOSIS — Z23 Encounter for immunization: Secondary | ICD-10-CM | POA: Diagnosis present

## 2016-01-21 LAB — COMPLETE METABOLIC PANEL WITH GFR
ALBUMIN: 3.8 g/dL (ref 3.6–5.1)
ALK PHOS: 62 U/L (ref 40–115)
ALT: 191 U/L — ABNORMAL HIGH (ref 9–46)
AST: 130 U/L — AB (ref 10–40)
BUN: 12 mg/dL (ref 7–25)
CHLORIDE: 104 mmol/L (ref 98–110)
CO2: 29 mmol/L (ref 20–31)
Calcium: 9 mg/dL (ref 8.6–10.3)
Creat: 1.11 mg/dL (ref 0.60–1.35)
GFR, EST NON AFRICAN AMERICAN: 86 mL/min (ref >=60)
GFR, Est African American: >89 mL/min (ref >=60)
GLUCOSE: 81 mg/dL (ref 65–99)
POTASSIUM: 4.6 mmol/L (ref 3.5–5.3)
SODIUM: 137 mmol/L (ref 135–146)
Total Bilirubin: 0.4 mg/dL (ref 0.2–1.2)
Total Protein: 6.5 g/dL (ref 6.1–8.1)

## 2016-01-28 ENCOUNTER — Telehealth: Payer: Self-pay | Admitting: *Deleted

## 2016-01-28 NOTE — Telephone Encounter (Signed)
Called patient and he is aware also reminded him of follow up appt 02/07/16. Encouraged him to not drink alcohol and increase his water intake as well.

## 2016-01-28 NOTE — Telephone Encounter (Signed)
Patient called to check his lab results. Advised him will send message to the doctor and give him a call back once he responds.

## 2016-01-28 NOTE — Telephone Encounter (Signed)
Liver functino tests are dramatically better, though still not normal No alcohol!

## 2016-02-07 ENCOUNTER — Other Ambulatory Visit: Payer: Self-pay | Admitting: *Deleted

## 2016-02-07 ENCOUNTER — Ambulatory Visit (INDEPENDENT_AMBULATORY_CARE_PROVIDER_SITE_OTHER): Payer: Medicare Other | Admitting: Infectious Diseases

## 2016-02-07 ENCOUNTER — Encounter: Payer: Self-pay | Admitting: Infectious Diseases

## 2016-02-07 VITALS — BP 108/71 | HR 120 | Temp 98.0°F | Wt 176.0 lb

## 2016-02-07 DIAGNOSIS — B20 Human immunodeficiency virus [HIV] disease: Secondary | ICD-10-CM | POA: Diagnosis not present

## 2016-02-07 DIAGNOSIS — Z113 Encounter for screening for infections with a predominantly sexual mode of transmission: Secondary | ICD-10-CM | POA: Diagnosis not present

## 2016-02-07 DIAGNOSIS — F3112 Bipolar disorder, current episode manic without psychotic features, moderate: Secondary | ICD-10-CM | POA: Diagnosis not present

## 2016-02-07 DIAGNOSIS — J4531 Mild persistent asthma with (acute) exacerbation: Secondary | ICD-10-CM

## 2016-02-07 LAB — COMPREHENSIVE METABOLIC PANEL
ALK PHOS: 89 U/L (ref 40–115)
ALT: 387 U/L — AB (ref 9–46)
AST: 182 U/L — AB (ref 10–40)
Albumin: 4.2 g/dL (ref 3.6–5.1)
BILIRUBIN TOTAL: 0.6 mg/dL (ref 0.2–1.2)
BUN: 12 mg/dL (ref 7–25)
CALCIUM: 9.8 mg/dL (ref 8.6–10.3)
CO2: 25 mmol/L (ref 20–31)
Chloride: 101 mmol/L (ref 98–110)
Creat: 0.98 mg/dL (ref 0.60–1.35)
GLUCOSE: 92 mg/dL (ref 65–99)
Potassium: 4 mmol/L (ref 3.5–5.3)
SODIUM: 137 mmol/L (ref 135–146)
Total Protein: 7.2 g/dL (ref 6.1–8.1)

## 2016-02-07 MED ORDER — ELVITEG-COBIC-EMTRICIT-TENOFAF 150-150-200-10 MG PO TABS
1.0000 | ORAL_TABLET | Freq: Every day | ORAL | Status: DC
Start: 2016-02-07 — End: 2016-05-03

## 2016-02-07 MED ORDER — ALBUTEROL SULFATE HFA 108 (90 BASE) MCG/ACT IN AERS: 2.0000 | INHALATION_SPRAY | Freq: Four times a day (QID) | RESPIRATORY_TRACT | Status: DC | PRN

## 2016-02-07 NOTE — Assessment & Plan Note (Signed)
Not currently acute His inhaler is refilled.

## 2016-02-07 NOTE — Progress Notes (Signed)
   Subjective:    Patient ID: Randy Castillo, male    DOB: 1981/09/15, 35 y.o.   MRN: [id number]  HPI 35 yo M with newly dx HIV+ 01-2015 and bipolar d/o. He was was started on genvoya 01-2015.  His course was then complicated by hepatitis.  Feels poorly today- pain with scapula fracture, neuropathy. C/o fatigue.  Has been seen at pain clinic, did not feel like they were listening.  Living with family.  5 cigarettes/day. Also vaping.  Denies missed art.   HIV 1 RNA QUANT (copies/mL)  Date Value  12/30/2015 <20  07/05/2015 <20  02/22/2015 192*   CD4 T CELL ABS (/uL)  Date Value  12/30/2015 800  02/22/2015 380*  12/31/2014 440   Medications are reviewed, updated.   Review of Systems  Constitutional: Positive for fever, appetite change and unexpected weight change.  Respiratory: Positive for cough. Negative for shortness of breath.   Gastrointestinal: Positive for diarrhea. Negative for nausea.  Genitourinary: Negative for difficulty urinating.       Hesitancy  Psychiatric/Behavioral: Positive for dysphoric mood. The patient is nervous/anxious.        Objective:   Physical Exam  Constitutional: He appears well-developed and well-nourished.  HENT:  Mouth/Throat: No oropharyngeal exudate.  Eyes: EOM are normal. Pupils are equal, round, and reactive to light.  Neck: Neck supple.  Cardiovascular: Normal rate, regular rhythm and normal heart sounds.   Pulmonary/Chest: Effort normal and breath sounds normal.  Abdominal: Soft. Bowel sounds are normal. There is no tenderness. There is no rebound.  Lymphadenopathy:    He has no cervical adenopathy.  Psychiatric: He exhibits a depressed mood.  Tearful.       Assessment & Plan:

## 2016-02-07 NOTE — Assessment & Plan Note (Signed)
He is doing well The cause of his hepatitis is unclear.  Will continue genvoya for now rtc in 6 months

## 2016-02-07 NOTE — Assessment & Plan Note (Signed)
He meets with our counselor today He is distressed, tearful.

## 2016-02-10 ENCOUNTER — Telehealth: Payer: Self-pay | Admitting: *Deleted

## 2016-02-10 NOTE — Telephone Encounter (Signed)
Patient calling for liver panel results. Please advise what the results mean and potential next steps.  Patient is insured. [name], RN

## 2016-02-11 ENCOUNTER — Other Ambulatory Visit: Payer: Self-pay | Admitting: Infectious Diseases

## 2016-02-11 DIAGNOSIS — K759 Inflammatory liver disease, unspecified: Secondary | ICD-10-CM

## 2016-02-11 NOTE — Telephone Encounter (Signed)
Pt's LFTs are again elevated.  He needs to be seen by GI

## 2016-02-11 NOTE — Telephone Encounter (Signed)
Patient has Medicare/Medicaid, should be able to have the ultrasound/elastography without prior authorization. He will call [facility] to get this done. [facility], as it is closer to patient. Thanks!

## 2016-02-11 NOTE — Addendum Note (Signed)
Addended by: Andree CossHOWELL, Chonita Gadea M on: 02/11/2016 12:23 PM   Modules accepted: Orders

## 2016-02-14 ENCOUNTER — Encounter: Payer: Self-pay | Admitting: Gastroenterology

## 2016-02-15 DIAGNOSIS — F331 Major depressive disorder, recurrent, moderate: Secondary | ICD-10-CM | POA: Diagnosis not present

## 2016-02-15 DIAGNOSIS — I10 Essential (primary) hypertension: Secondary | ICD-10-CM | POA: Diagnosis not present

## 2016-02-15 DIAGNOSIS — F4323 Adjustment disorder with mixed anxiety and depressed mood: Secondary | ICD-10-CM | POA: Diagnosis not present

## 2016-02-15 DIAGNOSIS — G622 Polyneuropathy due to other toxic agents: Secondary | ICD-10-CM | POA: Diagnosis not present

## 2016-02-18 ENCOUNTER — Ambulatory Visit (HOSPITAL_COMMUNITY)
Admission: RE | Admit: 2016-02-18 | Discharge: 2016-02-18 | Disposition: A | Discharge status: Home / Self Care | Payer: Medicare Other | Source: Ambulatory Visit | Attending: Infectious Diseases | Admitting: Infectious Diseases

## 2016-02-18 DIAGNOSIS — K759 Inflammatory liver disease, unspecified: Secondary | ICD-10-CM

## 2016-02-18 DIAGNOSIS — K769 Liver disease, unspecified: Secondary | ICD-10-CM | POA: Diagnosis not present

## 2016-02-18 DIAGNOSIS — R7989 Other specified abnormal findings of blood chemistry: Secondary | ICD-10-CM | POA: Diagnosis not present

## 2016-02-24 ENCOUNTER — Ambulatory Visit (INDEPENDENT_AMBULATORY_CARE_PROVIDER_SITE_OTHER): Payer: Medicare Other | Admitting: Gastroenterology

## 2016-02-24 ENCOUNTER — Telehealth: Payer: Self-pay | Admitting: Infectious Diseases

## 2016-02-24 ENCOUNTER — Telehealth: Payer: Self-pay | Admitting: *Deleted

## 2016-02-24 ENCOUNTER — Encounter: Payer: Self-pay | Admitting: Gastroenterology

## 2016-02-24 VITALS — BP 131/83 | HR 77 | Temp 97.5°F | Ht 71.0 in | Wt 178.4 lb

## 2016-02-24 DIAGNOSIS — R7989 Other specified abnormal findings of blood chemistry: Secondary | ICD-10-CM

## 2016-02-24 DIAGNOSIS — R1011 Right upper quadrant pain: Secondary | ICD-10-CM

## 2016-02-24 DIAGNOSIS — R1030 Lower abdominal pain, unspecified: Secondary | ICD-10-CM | POA: Diagnosis not present

## 2016-02-24 DIAGNOSIS — R945 Abnormal results of liver function studies: Secondary | ICD-10-CM | POA: Insufficient documentation

## 2016-02-24 NOTE — Telephone Encounter (Signed)
Called pt to discuss his u/s and his GI referral.  He was not available on his cell or his home number.  I asked that he call back in the AM if he still has questions after his GI appt (which he is currently at).

## 2016-02-24 NOTE — Telephone Encounter (Signed)
Patient called for the results of his recent abdominal Ultrasound. He is upset because he was sent to GI and they told him we sent him because he has Hep C. Advised the patient we do not have Hep C on his problem list and his labs still are not normal and Dr Ninetta LightsHatcher wanted him checked by a specialist. He advised he was jsut here and no one went over his ultrasound and he would like for the doctor to call him about it when he can. Advised him will send a message to the doctor and someone will call him back as soon as we can.

## 2016-02-24 NOTE — Progress Notes (Signed)
Primary Care Physician:  Geraldo Pitter, MD Referring Physician: Dr. Johny Sax Primary Gastroenterologist:  Jonette Eva, MD  Chief Complaint  Patient presents with  . Hepatitis  . pt states he doesn't know why he is here    HPI:  Randy Castillo is a 35 y.o. male here at the request of Dr. Johny Sax for further evaluation of hepatitis. Patient has a history of bipolar disorder, newly diagnosed HIV positive in February 2016 and started on Genvoya at that time. His course was complicated by hepatitis.  Patient seen remotely by her practice at time of EGD in February 2008. He had erosive esophagitis at the time. Patchy erythema in the body and antrum of the stomach consistent with gastritis.  Patient states that he has received both hepatitis A and B vaccines. Reports being on antiviral therapy for one year. No new medications except 2 days ago after seeing his psychiatrist he was started on Latuda and his Symbyax was increased. He denies taking over-the-counter medications, herbal medications with the exception of a rare Goody's powder for migraine. Denies alcohol use. History of chronic neck, back, shoulder blade pain previously went to pain management but he quit on his own.  Patient complains of weakness and fatigue. He has intermittent right upper quadrant pain. Increased pain with meals as well as nausea. Reports remote colonoscopy and EGD possibly at [facility]. We did find an EGD done by Dr. Darrick Penna as outlined. Has a history of diarrhea, was on Viberzi at one point but stopped this back in October. Bowel movements have been regular since that time. Denies blood in the stool or melena. Denies heartburn. Reports nearly 40 pound weight loss over the course of the past 2 years. Reports his weight has been stable more recently. Denies fever. Has had some vague lower abd pain.  He contracted HIV from male partner. Patient states he is married and filing for a divorce soon. He has a young  child, tells me he does not know whether a boy or girl because "wife is in hiding".    Right upper quadrant ultrasound in March showed a 6 x 8 x 8 mm echogenic focus, unchanged from September 2012 study favoring small hemangioma. An additional 6 mm hypoechoic lesion within the right hepatic lobe, too small to characterize. Follow-up study recommended.   Current Outpatient Prescriptions  Medication Sig Dispense Refill  . albuterol (PROVENTIL HFA;VENTOLIN HFA) 108 (90 Base) MCG/ACT inhaler Inhale 2 puffs into the lungs every 6 (six) hours as needed for wheezing. 1 Inhaler 3  . alprazolam (XANAX) 2 MG tablet Take 1 mg by mouth 3 (three) times daily as needed for sleep or anxiety.     Marland Kitchen amphetamine-dextroamphetamine (ADDERALL) 20 MG tablet Take 20 mg by mouth 4 (four) times daily.    Marland Kitchen elvitegravir-cobicistat-emtricitabine-tenofovir (GENVOYA) 150-150-200-10 MG TABS tablet Take 1 tablet by mouth daily with breakfast. 30 tablet 5  . LATUDA 20 MG TABS tablet   0  . lisinopril-hydrochlorothiazide (PRINZIDE,ZESTORETIC) 10-12.5 MG tablet Take 1 tablet by mouth daily.  1  . OLANZapine-FLUoxetine (SYMBYAX) 12-50 MG capsule Take 1 capsule by mouth at bedtime.    . pregabalin (LYRICA) 100 MG capsule Take 100-200 mg by mouth 2 (two) times daily as needed (for back pain). Takes 1 capsule in the morning and 2 capsules at bedtime    . traZODone (DESYREL) 50 MG tablet Take 50 mg by mouth at bedtime. 1-2 tablets by mouth every night at bedtime    . valACYclovir (  VALTREX) 1000 MG tablet Take 1,000 mg by mouth daily. *MAY TAKE ONE TABLET IN ADDITION AS NEEDED     No current facility-administered medications for this visit.    Allergies as of 02/24/2016 - Review Complete 02/24/2016  Allergen Reaction Noted  . Darvocet [propoxyphene n-acetaminophen] Nausea And Vomiting 09/02/2011    Past Medical History  Diagnosis Date  . Herpes   . Gonorrhea in male   . Asthma   . Environmental allergies   . Bipolar  affective disorder, manic, in partial or remission (HCC)   . Migraine   . Hypertension   . Seizures (HCC)   . Anxiety   . Neuropathic pain   . HIV disease (HCC)   . PTSD (post-traumatic stress disorder)     Past Surgical History  Procedure Laterality Date  . Cyst excision      Family History  Problem Relation Age of Onset  . Family history unknown: Yes    Social History   Social History  . Marital Status: Married    Spouse Name: N/A  . Number of Children: 1  . Years of Education: N/A   Occupational History  . Not on file.   Social History Main Topics  . Smoking status: Current Every Day Smoker -- 0.25 packs/day for 16 years    Types: Cigarettes  . Smokeless tobacco: Never Used     Comment: Is vaping to help reduce cigarette use  . Alcohol Use: No  . Drug Use: Yes    Special: Marijuana     Comment: drugs in mid-20s. intranasal/iv  . Sexual Activity:    Partners: Female    Copy: Condom     Comment: pt. declined condoms   Other Topics Concern  . Not on file   Social History Narrative      ROS:  General: Negative for   fever, chills. See hpi. Eyes: Negative for vision changes.  ENT: Negative for hoarseness, difficulty swallowing , nasal congestion. CV: Negative for chest pain, angina, palpitations, dyspnea on exertion, peripheral edema.  Respiratory: Negative for dyspnea at rest, dyspnea on exertion, cough, sputum, wheezing.  GI: See history of present illness. GU:  Negative for dysuria, hematuria, urinary incontinence, urinary frequency, nocturnal urination.  MS: Negative for joint pain. Chronic neck/back pain.  Derm: Negative for rash or itching.  Neuro: Negative for weakness, abnormal sensation, seizure, frequent headaches, memory loss, confusion.  Psych: Negative for   suicidal ideation, hallucinations. +bipolar Endo: Negative for unusual weight change.  Heme: Negative for bruising or bleeding. Allergy: Negative for rash or  hives.    Physical Examination:  BP 131/83 mmHg  Pulse 77  Temp(Src) 97.5 F (36.4 C)  Ht  (1.803 m)  Wt 178 lb 6.4 oz (80.922 kg)  BMI 24.89 kg/m2   General: Well-nourished, well-developed in no acute distress. Multiple piercings and tatoos. Head: Normocephalic, atraumatic.   Eyes: Conjunctiva pink, no icterus. Mouth: Oropharyngeal mucosa moist and pink , no lesions erythema or exudate. Neck: Supple without thyromegaly, masses, or lymphadenopathy.  Lungs: Clear to auscultation bilaterally.  Heart: Regular rate and rhythm, no murmurs rubs or gallops.  Abdomen: Bowel sounds are normal, mild ruq tenderness, nondistended, no hepatosplenomegaly or masses, no abdominal bruits or    hernia , no rebound or guarding.  Vague lower abd tenderness. Rectal: not performed Extremities: No lower extremity edema. No clubbing or deformities.  Neuro: Alert and oriented x 4 , grossly normal neurologically.  Skin: Warm and dry, no rash  or jaundice.  Gen Psych: Alert and cooperative, normal mood and affect.  Labs: Lab Results  Component Value Date   WBC 5.2 12/30/2015   HGB 15.6 12/30/2015   HCT 46.1 12/30/2015   MCV 94.7 12/30/2015   PLT 198 12/30/2015   Lab Results  Component Value Date   CREATININE 0.98 02/07/2016   BUN 12 02/07/2016   NA 137 02/07/2016   K 4.0 02/07/2016   CL 101 02/07/2016   CO2 25 02/07/2016   Lab Results  Component Value Date   ALT 387* 02/07/2016   AST 182* 02/07/2016   ALKPHOS 89 02/07/2016   BILITOT 0.6 02/07/2016   Component     Latest Ref Rng 12/31/2014 07/05/2015 08/14/2015 12/30/2015 01/04/2016  AST     10 - 40 U/L 51 (H) 15 26 939 (H) 57 (H)  ALT     9 - 46 U/L 63 (H) 11 20 1870 (H) 560 (H)   Component     Latest Ref Rng 01/21/2016 02/07/2016  AST     10 - 40 U/L 130 (H) 182 (H)  ALT     9 - 46 U/L 191 (H) 387 (H)   Lab Results  Component Value Date   HIV1RNAQUANT <20 12/30/2015      Imaging Studies: Kore[place] Abdomen Limited  Ruq  02/18/2016  CLINICAL DATA:  Patient with history of hepatitis.  Abnormal LFTs. EXAM: US ABDOMEN LIMITED - RIGHT UPPER QUADRANT COMPARISON:  Abdominal ultrasound 08/18/2011 FINDINGS: Gallbladder: No gallstones or wall thickening visualized. No sonographic Murphy sign noted by sonographer. Common bile duct: Diameter: 2 mm Liver: Within the right hepatic lobe there is a 6 x 8 x 8 mm echogenic focus, unchanged from exam dating 08/18/2011. Given stability over time, this is favored to represent a small hemangioma. There is an additional 6 mm hypoechoic lesion within the right hepatic lobe, too small to characterize. IMPRESSION: No acute process within the right upper quadrant. No cholelithiasis or sonographic evidence for acute cholecystitis. There is a 5 mm indeterminate hypoechoic lesion within the right hepatic lobe, too small to characterize. This may potentially represent a cyst. Recommend attention on followup. Hyperechoic lesion within the right hepatic lobe stable dating back to 2012, most compatible with hemangioma. Electronically Signed   By: Annia Beltrew  Davis M.D.   On: 02/18/2016 09:54

## 2016-02-24 NOTE — Patient Instructions (Signed)
Please have your labs done. We will contact you with results within 5-7 business days.

## 2016-02-25 ENCOUNTER — Encounter: Payer: Self-pay | Admitting: Gastroenterology

## 2016-02-25 NOTE — Assessment & Plan Note (Signed)
35 year old gentleman, HIV positive, on chronic Genvoya, HIV RNA Quant less than 20 currently. He presents with recent significant elevation of his AST/ALT noted in January 2017. His numbers are normal back in September 2016. There has been some overall improvement however not a clear downward trend at this time. His AST is 182 (initially 939), ALT 387 (initially 1870). Abdominal ultrasound unrevealing as far as etiology of abnormal LFTs. He does have an indeterminant liver lesion which will need to have follow-up ultrasound likely in 4-6 months. With the significant increase of his AST, ALT, would be concerned about possibility of acute viral hepatitis, drug-induced hepatitis, numbers not consistent with obstructive etiology. Labs to be done looking for viral hepatitis, autoimmune hepatitis, Wilson's disease, hemachromatosis. Further recommendations to follow.

## 2016-02-28 NOTE — Progress Notes (Signed)
CC'D TO PCP °

## 2016-03-01 DIAGNOSIS — R109 Unspecified abdominal pain: Secondary | ICD-10-CM | POA: Diagnosis not present

## 2016-03-01 DIAGNOSIS — R799 Abnormal finding of blood chemistry, unspecified: Secondary | ICD-10-CM | POA: Diagnosis not present

## 2016-03-01 DIAGNOSIS — R1011 Right upper quadrant pain: Secondary | ICD-10-CM | POA: Diagnosis not present

## 2016-03-01 DIAGNOSIS — R1030 Lower abdominal pain, unspecified: Secondary | ICD-10-CM | POA: Diagnosis not present

## 2016-03-01 DIAGNOSIS — R7989 Other specified abnormal findings of blood chemistry: Secondary | ICD-10-CM | POA: Diagnosis not present

## 2016-03-02 LAB — HEPATIC FUNCTION PANEL
ALK PHOS: 52 U/L (ref 40–115)
ALT: 89 U/L — ABNORMAL HIGH (ref 9–46)
AST: 53 U/L — ABNORMAL HIGH (ref 10–40)
Albumin: 4.1 g/dL (ref 3.6–5.1)
BILIRUBIN DIRECT: 0.1 mg/dL (ref <=0.2)
BILIRUBIN INDIRECT: 0.3 mg/dL (ref 0.2–1.2)
BILIRUBIN TOTAL: 0.4 mg/dL (ref 0.2–1.2)
Total Protein: 7 g/dL (ref 6.1–8.1)

## 2016-03-02 LAB — IGG, IGA, IGM
IGM, SERUM: 75 mg/dL (ref 41–251)
IgA: 284 mg/dL (ref 68–379)
IgG (Immunoglobin G), Serum: 1190 mg/dL (ref 650–1600)

## 2016-03-02 LAB — IRON AND TIBC
%SAT: 29 % (ref 15–60)
IRON: 116 ug/dL (ref 50–180)
TIBC: 400 ug/dL (ref 250–425)
UIBC: 284 ug/dL (ref 125–400)

## 2016-03-02 LAB — HEPATITIS B SURFACE ANTIGEN: Hepatitis B Surface Ag: NEGATIVE

## 2016-03-02 LAB — HEPATITIS C ANTIBODY: HCV Ab: REACTIVE — AB

## 2016-03-02 LAB — FERRITIN: FERRITIN: 49 ng/mL (ref 20–345)

## 2016-03-02 LAB — ANA: Anti Nuclear Antibody(ANA): NEGATIVE

## 2016-03-02 LAB — PROTIME-INR
INR: 0.96 (ref <1.50)
PROTHROMBIN TIME: 12.9 s (ref 11.6–15.2)

## 2016-03-02 LAB — HEPATITIS B SURFACE ANTIBODY,QUALITATIVE

## 2016-03-03 LAB — MITOCHONDRIAL/SMOOTH MUSCLE AB PNL
Mitochondrial M2 Ab, IgG: <=20 Units (ref <=20.0)
Smooth Muscle Ab: <20 U (ref <20)

## 2016-03-03 LAB — CERULOPLASMIN: Ceruloplasmin: 30 mg/dL (ref 18–36)

## 2016-03-06 ENCOUNTER — Ambulatory Visit: Payer: Medicare Other | Admitting: Gastroenterology

## 2016-03-06 ENCOUNTER — Telehealth: Payer: Self-pay | Admitting: Gastroenterology

## 2016-03-06 LAB — HEPATITIS C RNA QUANTITATIVE
HCV Quantitative Log: 4.39 {Log} — ABNORMAL HIGH (ref <1.18)
HCV Quantitative: 24399 IU/mL — ABNORMAL HIGH (ref <15)

## 2016-03-06 NOTE — Telephone Encounter (Signed)
Pt would like to get the results of his labs. He said he is HIV positive and his mom was exposed to his blood x 3 days ago and she is panicky. He cut himself on a kitchen knife and his mom came in contact with the blood.  He would like to hear his results of his labs.

## 2016-03-06 NOTE — Telephone Encounter (Signed)
LMOM to call.

## 2016-03-06 NOTE — Telephone Encounter (Signed)
Patient should call his ID/HIV doctor and discuss with them what she should do for her exposure.  All of his results are not back yet.

## 2016-03-06 NOTE — Telephone Encounter (Signed)
PATIENT SAID TO CALL HIM REGARDING HIS LAB RESULTS

## 2016-03-07 ENCOUNTER — Telehealth: Payer: Self-pay | Admitting: *Deleted

## 2016-03-07 ENCOUNTER — Telehealth: Payer: Self-pay | Admitting: Gastroenterology

## 2016-03-07 DIAGNOSIS — Z0000 Encounter for general adult medical examination without abnormal findings: Secondary | ICD-10-CM | POA: Diagnosis not present

## 2016-03-07 NOTE — Telephone Encounter (Signed)
Patient called stating he was diagnosed with Hep C by his gastroenterologist and wanted Dr. Ninetta LightsHatcher to call him so that he can start Hep C medication as soon as possible. This is an established HIV patient; he was suppose to follow up in August 2017. Due to his new diagnosis of Hep C, I scheduled him with Dr. Moshe CiproHatcher's next available appt which is 05/03/16. Dr. Ninetta LightsHatcher had referred patient to GI and they are deferring to ID for treatment. Randy Castillo

## 2016-03-07 NOTE — Telephone Encounter (Signed)
I spoke to patient. See result note for HCV.

## 2016-03-07 NOTE — Progress Notes (Signed)
Quick Note:  FYI Dr. Ninetta LightsHatcher. ______

## 2016-03-07 NOTE — Progress Notes (Signed)
Quick Note:  [facility] and reported the Hep C to Reliant EnergyBillie Whitner. She will give the pt a call tomorrow.   Also, pt called back and said to let Verlon AuLeslie know that the earliest appt he could get with Dr. Ninetta LightsHatcher was May 24 and he wants to know should he go somewhere else. I told him that was probably as quick as he could get in but I will ask Verlon AuLeslie.  Sending to Darl PikesSusan to send FYI to Dr. Ninetta LightsHatcher. ______

## 2016-03-07 NOTE — Telephone Encounter (Signed)
Please call patient at [phone] regarding his labs and going to Infectious disease.

## 2016-03-07 NOTE — Progress Notes (Signed)
Quick Note:  Patient has Hepatitis C. He had negative antibody in 12/2014. His LFTs were normal in 08/2015. Suspect ACUTE Hep C based on markedly elevated LFTs in 12/2015. Fortunately his numbers are much better currently.   Please report HCV to the Health Department.   I have spoken to patient. He will contact Dr. Moshe CiproHatcher's office for follow up. They may opt to wait on treatment six months out from bump in LFTs in 12/2015 to see if he clears HCV on his own. I will leave treatment up to the ID team.   He needs f/u ruq u/s in 4 months to follow up on liver lesion. Please NIC.   Patient's mother was exposed to HCV this weekend. Patient cut himself and his blood splattered on mother's hand/arm and she has cracked knuckles. They will contact ID about this and she is contacting her PCP as well. ______

## 2016-03-08 NOTE — Telephone Encounter (Signed)
I spoke to pt yesterday. See result note.

## 2016-03-09 DIAGNOSIS — M25511 Pain in right shoulder: Secondary | ICD-10-CM | POA: Diagnosis not present

## 2016-03-09 DIAGNOSIS — Z131 Encounter for screening for diabetes mellitus: Secondary | ICD-10-CM | POA: Diagnosis not present

## 2016-03-09 DIAGNOSIS — M542 Cervicalgia: Secondary | ICD-10-CM | POA: Diagnosis not present

## 2016-03-09 DIAGNOSIS — I10 Essential (primary) hypertension: Secondary | ICD-10-CM | POA: Diagnosis not present

## 2016-03-09 DIAGNOSIS — M545 Low back pain: Secondary | ICD-10-CM | POA: Diagnosis not present

## 2016-03-09 DIAGNOSIS — M810 Age-related osteoporosis without current pathological fracture: Secondary | ICD-10-CM | POA: Diagnosis not present

## 2016-03-09 DIAGNOSIS — Z114 Encounter for screening for human immunodeficiency virus [HIV]: Secondary | ICD-10-CM | POA: Diagnosis not present

## 2016-03-09 DIAGNOSIS — E782 Mixed hyperlipidemia: Secondary | ICD-10-CM | POA: Diagnosis not present

## 2016-03-09 DIAGNOSIS — Z0000 Encounter for general adult medical examination without abnormal findings: Secondary | ICD-10-CM | POA: Diagnosis not present

## 2016-03-16 NOTE — Progress Notes (Signed)
Quick Note:  Absolutely no reason to go anywhere else. He should follow up with his ID provider, Dr. Ninetta LightsHatcher. There is NO urgency. May 24th is appropriate. Dr. Ninetta LightsHatcher aware of HCV status as well.  Raynelle FanningJulie, can you please let patient know in Tyler AasDoris' absence? ______

## 2016-03-20 NOTE — Progress Notes (Signed)
Quick Note:  Pt is aware. ______ 

## 2016-03-27 DIAGNOSIS — M25511 Pain in right shoulder: Secondary | ICD-10-CM | POA: Diagnosis not present

## 2016-03-27 DIAGNOSIS — F909 Attention-deficit hyperactivity disorder, unspecified type: Secondary | ICD-10-CM | POA: Diagnosis not present

## 2016-03-27 DIAGNOSIS — F429 Obsessive-compulsive disorder, unspecified: Secondary | ICD-10-CM | POA: Diagnosis not present

## 2016-03-27 DIAGNOSIS — I10 Essential (primary) hypertension: Secondary | ICD-10-CM | POA: Diagnosis not present

## 2016-05-03 ENCOUNTER — Encounter: Payer: Self-pay | Admitting: Infectious Diseases

## 2016-05-03 ENCOUNTER — Ambulatory Visit (INDEPENDENT_AMBULATORY_CARE_PROVIDER_SITE_OTHER): Payer: Medicare Other | Admitting: Infectious Diseases

## 2016-05-03 VITALS — BP 101/66 | HR 99 | Temp 98.0°F | Ht 70.0 in | Wt 176.0 lb

## 2016-05-03 DIAGNOSIS — B1920 Unspecified viral hepatitis C without hepatic coma: Secondary | ICD-10-CM

## 2016-05-03 DIAGNOSIS — Z8619 Personal history of other infectious and parasitic diseases: Secondary | ICD-10-CM | POA: Insufficient documentation

## 2016-05-03 DIAGNOSIS — B20 Human immunodeficiency virus [HIV] disease: Secondary | ICD-10-CM

## 2016-05-03 DIAGNOSIS — K7400 Hepatic fibrosis, unspecified: Secondary | ICD-10-CM

## 2016-05-03 DIAGNOSIS — F3113 Bipolar disorder, current episode manic without psychotic features, severe: Secondary | ICD-10-CM | POA: Diagnosis not present

## 2016-05-03 DIAGNOSIS — K732 Chronic active hepatitis, not elsewhere classified: Secondary | ICD-10-CM

## 2016-05-03 HISTORY — DX: Personal history of other infectious and parasitic diseases: Z86.19

## 2016-05-03 MED ORDER — OLANZAPINE-FLUOXETINE HCL 6-25 MG PO CAPS: 1.0000 | ORAL_CAPSULE | Freq: Every day | ORAL | Status: DC

## 2016-05-03 MED ORDER — EMTRICITABINE-TENOFOVIR AF 200-25 MG PO TABS: 1.0000 | ORAL_TABLET | Freq: Every day | ORAL | Status: DC

## 2016-05-03 MED ORDER — DOLUTEGRAVIR SODIUM 50 MG PO TABS: 50.0000 mg | ORAL_TABLET | Freq: Every day | ORAL | Status: DC

## 2016-05-03 NOTE — Progress Notes (Signed)
   Subjective:    Patient ID: Randy Castillo, male    DOB: 12/13/1980, 35 y.o.   MRN: [id number]  HPI 35 yo M with newly dx HIV+ 01-2015 and bipolar d/o. He was was started on genvoya 01-2015.  Smoking 1 ppd. Not interested in quitting.   Newly dx with Hep C in intervening period. VL 24,399. Needs genotype and fibrosure.    He is on latuda, olanzipine (6-25). Feeling better since meds changed.    HIV 1 RNA QUANT (copies/mL)  Date Value  12/30/2015 <20  07/05/2015 <20  02/22/2015 192*   CD4 T CELL ABS (/uL)  Date Value  12/30/2015 800  02/22/2015 380*  12/31/2014 440     Review of Systems  Constitutional: Negative for appetite change and unexpected weight change.  Respiratory: Negative for cough and shortness of breath.   Gastrointestinal: Negative for diarrhea and constipation.  Genitourinary: Negative for difficulty urinating.  Neurological: Positive for dizziness.  gets dizzy spells- does not have falls, attributes to eating only once daily, and to his multiple medications.      Objective:   Physical Exam  Constitutional: He appears well-developed and well-nourished.  HENT:  Mouth/Throat: No oropharyngeal exudate.  Eyes: EOM are normal. Pupils are equal, round, and reactive to light.  Neck: Neck supple.  Cardiovascular: Normal rate, regular rhythm and normal heart sounds.   Pulmonary/Chest: Effort normal and breath sounds normal.  Abdominal: Soft. Bowel sounds are normal. There is no tenderness. There is no rebound.  Musculoskeletal: He exhibits no edema.  Lymphadenopathy:    He has no cervical adenopathy.  Psychiatric: He has a normal mood and affect.       Assessment & Plan:

## 2016-05-03 NOTE — Assessment & Plan Note (Signed)
He is doing well Will change him to DTGV/Desc to facilitate his Hep C rx.  Will see him back in 3-4 weeks He is not sexually active.

## 2016-05-03 NOTE — Assessment & Plan Note (Addendum)
Appreciate psych f/u His med list is updated.  jodi may see him today

## 2016-05-03 NOTE — Progress Notes (Signed)
Patient ID: Linville Evelena LeydenR Hooper, male   DOB: 01/17/1981, 35 y.o.   MRN: [id number] HPI: Marsha R Quintella ReichertHooper is a 35 y.o. male who is here for his HIV f/u. He is also co-infected with hep C.   No results found for: HCVGENOTYPE, HEPCGENOTYPE  Allergies: Allergies  Allergen Reactions  . Darvocet [Propoxyphene N-Acetaminophen] Nausea And Vomiting    Vitals: Temp: 98 F (36.7 C) (05/24 1037) Temp Source: Oral (05/24 1037) BP: 101/66 mmHg (05/24 1037) Pulse Rate: 99 (05/24 1037)  Past Medical History: Past Medical History  Diagnosis Date  . Herpes   . Gonorrhea in male   . Asthma   . Environmental allergies   . Bipolar affective disorder, manic, in partial or remission (HCC)   . Migraine   . Hypertension   . Seizures (HCC)   . Anxiety   . Neuropathic pain   . HIV disease (HCC)   . PTSD (post-traumatic stress disorder)     Social History: Social History   Social History  . Marital Status: Married    Spouse Name: N/A  . Number of Children: 1  . Years of Education: N/A   Social History Main Topics  . Smoking status: Current Every Day Smoker -- 1.00 packs/day for 16 years    Types: Cigarettes    Start date: 12/11/1994  . Smokeless tobacco: Never Used     Comment: Is vaping to help reduce cigarette use  . Alcohol Use: No  . Drug Use: No     Comment: drugs in mid-20s. intranasal/iv  . Sexual Activity:    Partners: Female, Male    Birth Control/ Protection: Condom     Comment: pt. declined condoms   Other Topics Concern  . None   Social History Narrative    Labs: HIV 1 RNA QUANT (copies/mL)  Date Value  12/30/2015 <20  07/05/2015 <20  02/22/2015 192*   CD4 T CELL ABS (/uL)  Date Value  12/30/2015 800  02/22/2015 380*  12/31/2014 440   HEP B S AB (no units)  Date Value  03/01/2016 INDETER*   HEPATITIS B SURFACE AG (no units)  Date Value  03/01/2016 NEGATIVE   HCV AB (no units)  Date Value  03/01/2016 REACTIVE*    No results found for: HCVGENOTYPE,  HEPCGENOTYPE  Hepatitis C RNA quantitative Latest Ref Rng 03/01/2016  HCV Quantitative <15 IU/mL 24399(H)  HCV Quantitative Log <1.18 log 10 4.39(H)    AST (U/L)  Date Value  03/01/2016 53*  02/07/2016 182*  01/21/2016 130*   ALT (U/L)  Date Value  03/01/2016 89*  02/07/2016 387*  01/21/2016 191*   INR (no units)  Date Value  03/01/2016 0.96    CrCl: CrCl cannot be calculated (Patient has no serum creatinine result on file.).  Fibrosis Score: Waiting  Child-Pugh Score: Class A  Previous Treatment Regimen: None  Assessment: Wheeler is here for his HIV f/u. He is currently on Genvoya and is suppressed on it. He is also co-infected with hep C. We don't currently have a genotype so we are going to get that today along with his Fibrosure to get staging. He is on several bipolar meds (Latuda, adderall, trazodone, symbyax). We are also going to change his ART to DTG/Descovy so that we can avoid interaction with the cobicistat. [facility].  Recommendations:  Hep C genotype today Fibrosure Change Genvoya to DTG/Descovy  Ulyses [place], Tarvaris Puglia [place] Dun[place], VermontPharm.D., BCPS, AAHIVP Clinical Infectious Disease Pharmacist [facility] 05/03/2016, 11:06 AM

## 2016-05-03 NOTE — Assessment & Plan Note (Signed)
Will change his ART  he is meeting with pharm Will check genotype and fibrosure Will check Hep A serology rtc in 3-4 weeks.

## 2016-05-04 LAB — HEPATITIS A ANTIBODY, TOTAL: HEP A TOTAL AB: REACTIVE — AB

## 2016-05-07 LAB — HEPATITIS C GENOTYPE

## 2016-05-08 ENCOUNTER — Telehealth: Payer: Self-pay | Admitting: Infectious Diseases

## 2016-05-08 NOTE — Telephone Encounter (Signed)
Pt c/o hives for last 3-4 days.  He changed his genvoya to DTGV/Desc recently (since rash?) I advised him to stop ART, take benadryl 25mg  now and this PM.  He has no SOB or dysphagia.  I asked him to call back, go to ED if worsening.

## 2016-05-09 LAB — LIVER FIBROSIS, FIBROTEST-ACTITEST
ALT: 27 U/L (ref 9–46)
Alpha-2-Macroglobulin: 157 mg/dL (ref 106–279)
Apolipoprotein A1: 106 mg/dL (ref 94–176)
Bilirubin: 0.3 mg/dL (ref 0.2–1.2)
FIBROSIS SCORE: 0.08
GGT: 23 U/L (ref 3–90)
HAPTOGLOBIN: 213 mg/dL — AB (ref 43–212)
NECROINFLAMMAT ACT SCORE: 0.09
Reference ID: 1533113

## 2016-05-10 NOTE — Telephone Encounter (Signed)
The patient has scheduled an appt with his PCP.

## 2016-05-10 NOTE — Telephone Encounter (Signed)
Can you ask him to see his PCP about his hives?

## 2016-05-10 NOTE — Telephone Encounter (Signed)
Pt continues to have "hives" on chest and back today.  Patient explained that the "hives" started prior to the change of his HIV medications.  The patient has continued the new HIV regimen.  He has changed his PCP to Dr. Kathrynn SpeedArvind. The patient requested that his latest and future office notes be sent to Dr. Kathrynn SpeedArvind.  The patient is concerned about coordination of care and prescriptions due to his recent Hep C diagnosis.  Pt given phone number for M. Arlester MarkerPham, [facility] Pharmacist, for discussion of care coordination and prescriptions.

## 2016-05-11 DIAGNOSIS — B182 Chronic viral hepatitis C: Secondary | ICD-10-CM | POA: Diagnosis not present

## 2016-05-11 DIAGNOSIS — Z21 Asymptomatic human immunodeficiency virus [HIV] infection status: Secondary | ICD-10-CM | POA: Diagnosis not present

## 2016-05-11 DIAGNOSIS — M25511 Pain in right shoulder: Secondary | ICD-10-CM | POA: Diagnosis not present

## 2016-05-11 DIAGNOSIS — M542 Cervicalgia: Secondary | ICD-10-CM | POA: Diagnosis not present

## 2016-05-12 ENCOUNTER — Other Ambulatory Visit: Payer: Self-pay | Admitting: *Deleted

## 2016-05-12 DIAGNOSIS — J4531 Mild persistent asthma with (acute) exacerbation: Secondary | ICD-10-CM

## 2016-05-12 MED ORDER — ALBUTEROL SULFATE HFA 108 (90 BASE) MCG/ACT IN AERS: 2.0000 | INHALATION_SPRAY | Freq: Four times a day (QID) | RESPIRATORY_TRACT | Status: DC | PRN

## 2016-05-23 ENCOUNTER — Other Ambulatory Visit: Payer: Self-pay | Admitting: Infectious Diseases

## 2016-05-23 DIAGNOSIS — K7400 Hepatic fibrosis, unspecified: Secondary | ICD-10-CM | POA: Insufficient documentation

## 2016-05-23 NOTE — Telephone Encounter (Signed)
Pt was last seen by his PCP, Dr. Kathrynn SpeedArvind on 05/11/16 to establish care.  Will ask PCP to begin prescribing the patient's non-HIV medications.

## 2016-05-25 ENCOUNTER — Other Ambulatory Visit (HOSPITAL_COMMUNITY)
Admission: RE | Admit: 2016-05-25 | Discharge: 2016-05-25 | Disposition: A | Discharge status: Home / Self Care | Payer: Medicare Other | Source: Ambulatory Visit | Attending: Infectious Diseases | Admitting: Infectious Diseases

## 2016-05-25 ENCOUNTER — Ambulatory Visit (INDEPENDENT_AMBULATORY_CARE_PROVIDER_SITE_OTHER): Payer: Medicare Other | Admitting: Infectious Diseases

## 2016-05-25 ENCOUNTER — Encounter: Payer: Self-pay | Admitting: Infectious Diseases

## 2016-05-25 ENCOUNTER — Other Ambulatory Visit: Payer: Self-pay | Admitting: Pharmacist Clinician (PhC)/ Clinical Pharmacy Specialist

## 2016-05-25 VITALS — BP 111/73 | HR 74 | Temp 97.6°F | Wt 168.0 lb

## 2016-05-25 DIAGNOSIS — K732 Chronic active hepatitis, not elsewhere classified: Secondary | ICD-10-CM

## 2016-05-25 DIAGNOSIS — Z113 Encounter for screening for infections with a predominantly sexual mode of transmission: Secondary | ICD-10-CM | POA: Insufficient documentation

## 2016-05-25 DIAGNOSIS — B20 Human immunodeficiency virus [HIV] disease: Secondary | ICD-10-CM

## 2016-05-25 DIAGNOSIS — B182 Chronic viral hepatitis C: Secondary | ICD-10-CM

## 2016-05-25 DIAGNOSIS — R21 Rash and other nonspecific skin eruption: Secondary | ICD-10-CM | POA: Insufficient documentation

## 2016-05-25 LAB — CBC
HCT: 40.4 % (ref 38.5–50.0)
Hemoglobin: 13.9 g/dL (ref 13.2–17.1)
MCH: 31 pg (ref 27.0–33.0)
MCHC: 34.4 g/dL (ref 32.0–36.0)
MCV: 90 fL (ref 80.0–100.0)
MPV: 9.6 fL (ref 7.5–12.5)
PLATELETS: 256 10*3/uL (ref 140–400)
RBC: 4.49 MIL/uL (ref 4.20–5.80)
RDW: 13.6 % (ref 11.0–15.0)
WBC: 4.6 10*3/uL (ref 3.8–10.8)

## 2016-05-25 MED ORDER — PREDNISONE 10 MG (21) PO TBPK: 10.0000 mg | ORAL_TABLET | Freq: Every day | ORAL | Status: DC

## 2016-05-25 MED ORDER — LEDIPASVIR-SOFOSBUVIR 90-400 MG PO TABS: 1.0000 | ORAL_TABLET | Freq: Every day | ORAL | Status: DC

## 2016-05-25 NOTE — Assessment & Plan Note (Addendum)
Will meet with Kathie RhodesBetty today.  Will stat him on harvoni Pharm will see him back in 2 weeks He is F0

## 2016-05-25 NOTE — Assessment & Plan Note (Signed)
Will give him steroid burst.

## 2016-05-25 NOTE — Progress Notes (Signed)
   Subjective:    Patient ID: Randy Castillo, male    DOB: 03/23/1981, 35 y.o.   MRN: [id number]  HPI 35 yo M with newly dx HIV+ 01-2015 and bipolar d/o. He was was started on genvoya 01-2015.  Smoking 1 ppd. Not interested in quitting.  Newly dx with Hep C (1a)  in intervening period. VL 24,399. Needs genotype and fibrosure.  He is on latuda, olanzipine (6-25).  His mood is better since latuda was increased to 80mg .  At his last ID visit 04-2016 he was started on desc/dtgv in order to facillitate his Hep C treatment.   He was recently started on diclofenac (4 weeks ago), he started new ART, and then 2 days later developed a diffuse rash. Occas pruritis on his back.  Worse after taking a shower.  No new soaps or shampoos, laundry detergents. No new aftershave.  No problems with dysphagia or SOB.    HIV 1 RNA QUANT (copies/mL)  Date Value  12/30/2015 <20  07/05/2015 <20  02/22/2015 192*   CD4 T CELL ABS (/uL)  Date Value  12/30/2015 800  02/22/2015 380*  12/31/2014 440     Review of Systems  Constitutional: Negative for appetite change and unexpected weight change.  Gastrointestinal: Negative for diarrhea and constipation.  Genitourinary: Negative for difficulty urinating and genital sores.  Skin: Positive for rash.       Objective:   Physical Exam  Constitutional: He appears well-developed and well-nourished.  HENT:  Mouth/Throat: No oropharyngeal exudate.  Eyes: EOM are normal. Pupils are equal, round, and reactive to light.  Neck: Neck supple.  Cardiovascular: Normal rate, regular rhythm and normal heart sounds.   Pulmonary/Chest: Effort normal and breath sounds normal.  Abdominal: Soft. Bowel sounds are normal. There is no tenderness. There is no rebound.  Musculoskeletal: He exhibits no edema.  Lymphadenopathy:    He has no cervical adenopathy.  Skin: Rash noted. Rash is macular.  Psychiatric: His mood appears not anxious. His affect is not angry and not  inappropriate. He does not exhibit a depressed mood.      Assessment & Plan:

## 2016-05-25 NOTE — Addendum Note (Signed)
Addended by: Mariea ClontsGREEN, Teddi Badalamenti D on: 05/25/2016 04:25 PM   Modules accepted: Orders

## 2016-05-25 NOTE — Assessment & Plan Note (Signed)
Offered/refused condoms.  Will check his labs today.

## 2016-05-26 LAB — COMPREHENSIVE METABOLIC PANEL
ALBUMIN: 3.8 g/dL (ref 3.6–5.1)
ALT: 90 U/L — ABNORMAL HIGH (ref 9–46)
AST: 42 U/L — ABNORMAL HIGH (ref 10–40)
Alkaline Phosphatase: 86 U/L (ref 40–115)
BUN: 11 mg/dL (ref 7–25)
CHLORIDE: 99 mmol/L (ref 98–110)
CO2: 27 mmol/L (ref 20–31)
Calcium: 9.4 mg/dL (ref 8.6–10.3)
Creat: 1.09 mg/dL (ref 0.60–1.35)
Glucose, Bld: 89 mg/dL (ref 65–99)
POTASSIUM: 4.5 mmol/L (ref 3.5–5.3)
Sodium: 132 mmol/L — ABNORMAL LOW (ref 135–146)
TOTAL PROTEIN: 7.5 g/dL (ref 6.1–8.1)
Total Bilirubin: 0.3 mg/dL (ref 0.2–1.2)

## 2016-05-26 LAB — HIV-1 RNA QUANT-NO REFLEX-BLD

## 2016-05-26 LAB — T-HELPER CELL (CD4) - (RCID CLINIC ONLY)
CD4 % Helper T Cell: 36 % (ref 33–55)
CD4 T CELL ABS: 640 /uL (ref 400–2700)

## 2016-05-29 LAB — URINE CYTOLOGY ANCILLARY ONLY
CHLAMYDIA, DNA PROBE: NEGATIVE
NEISSERIA GONORRHEA: NEGATIVE

## 2016-05-31 LAB — HCV RNA NS5A DRUG RESISTANCE: HCV NS5A SUBTYPE: NOT DETECTED

## 2016-06-05 ENCOUNTER — Telehealth: Payer: Self-pay | Admitting: *Deleted

## 2016-06-05 NOTE — Telephone Encounter (Addendum)
Dr. Mallory ShirkKaigan returned Dr. Moshe CiproHatcher's call to let him know that the NS5a test was undetected. He said that the virus has to be 2,000 IU or greater to be detected.  Minh MauricePham asked Mariea ClontsKaren Green, lab tech to look into this. Wendall MolaJacqueline Cockerham

## 2016-06-07 ENCOUNTER — Encounter: Payer: Self-pay | Admitting: Pharmacy Technician

## 2016-06-21 ENCOUNTER — Telehealth: Payer: Self-pay | Admitting: Pharmacist Clinician (PhC)/ Clinical Pharmacy Specialist

## 2016-06-21 NOTE — Telephone Encounter (Signed)
Called several times to see how he is doing with Harvoni [place]. No luck so far getting in touch with him.

## 2016-06-22 ENCOUNTER — Telehealth: Payer: Self-pay | Admitting: Pharmacist Clinician (PhC)/ Clinical Pharmacy Specialist

## 2016-06-22 ENCOUNTER — Ambulatory Visit: Payer: Medicare Other

## 2016-06-22 NOTE — Telephone Encounter (Signed)
Still can't get in touch with him to come in for labs.

## 2016-06-29 ENCOUNTER — Telehealth: Payer: Self-pay | Admitting: Pharmacist Clinician (PhC)/ Clinical Pharmacy Specialist

## 2016-06-29 ENCOUNTER — Other Ambulatory Visit: Payer: Self-pay | Admitting: Pharmacist Clinician (PhC)/ Clinical Pharmacy Specialist

## 2016-06-29 DIAGNOSIS — B182 Chronic viral hepatitis C: Secondary | ICD-10-CM

## 2016-06-29 NOTE — Telephone Encounter (Signed)
Finally able to get in touch with him. Will bring him in next week for labs. C/o some pain. Advised to take some low dose ibuprofen til then.

## 2016-07-06 ENCOUNTER — Other Ambulatory Visit: Payer: Medicare Other

## 2016-07-06 ENCOUNTER — Ambulatory Visit (INDEPENDENT_AMBULATORY_CARE_PROVIDER_SITE_OTHER): Payer: Medicare Other | Admitting: Pharmacist Clinician (PhC)/ Clinical Pharmacy Specialist

## 2016-07-06 DIAGNOSIS — B20 Human immunodeficiency virus [HIV] disease: Secondary | ICD-10-CM | POA: Diagnosis not present

## 2016-07-06 DIAGNOSIS — B182 Chronic viral hepatitis C: Secondary | ICD-10-CM

## 2016-07-06 LAB — COMPREHENSIVE METABOLIC PANEL WITH GFR
ALT: 28 U/L (ref 9–46)
AST: 33 U/L (ref 10–40)
Albumin: 3.8 g/dL (ref 3.6–5.1)
Alkaline Phosphatase: 88 U/L (ref 40–115)
BUN: 15 mg/dL (ref 7–25)
CO2: 25 mmol/L (ref 20–31)
Calcium: 9.2 mg/dL (ref 8.6–10.3)
Chloride: 103 mmol/L (ref 98–110)
Creat: 1.1 mg/dL (ref 0.60–1.35)
Glucose, Bld: 89 mg/dL (ref 65–99)
Potassium: 4.6 mmol/L (ref 3.5–5.3)
Sodium: 138 mmol/L (ref 135–146)
Total Bilirubin: 0.3 mg/dL (ref 0.2–1.2)
Total Protein: 7.2 g/dL (ref 6.1–8.1)

## 2016-07-06 MED ORDER — ONDANSETRON HCL 4 MG PO TABS: 4.0000 mg | ORAL_TABLET | Freq: Three times a day (TID) | ORAL | 0 refills | Status: DC | PRN

## 2016-07-06 NOTE — Progress Notes (Signed)
Patient ID: Randy Castillo, male   DOB: 18-Dec-1980, 35 y.o.   MRN: [id number] HPI: Randy Castillo is a 35 y.o. male who is finally here for his hep C pharmacy visit and labs  Lab Results  Component Value Date   HCVGENOTYPE 1a 05/03/2016    Allergies: Allergies  Allergen Reactions  . Zanaflex [Tizanidine Hcl]     Seizure!  Leodis Liverpool [Propoxyphene N-Acetaminophen] Nausea And Vomiting    Vitals:    Past Medical History: Past Medical History:  Diagnosis Date  . Anxiety   . Asthma   . Bipolar affective disorder, manic, in partial or remission (HCC)   . Environmental allergies   . Gonorrhea in male   . Herpes   . HIV disease (HCC)   . Hypertension   . Migraine   . Neuropathic pain   . PTSD (post-traumatic stress disorder)   . Seizures (HCC)     Social History: Social History   Social History  . Marital status: Married    Spouse name: N/A  . Number of children: 1  . Years of education: N/A   Social History Main Topics  . Smoking status: Current Every Day Smoker    Packs/day: 1.00    Years: 16.00    Types: Cigarettes    Start date: 12/11/1994  . Smokeless tobacco: Never Used     Comment: Is vaping to help reduce cigarette use  . Alcohol use No  . Drug use: No     Comment: drugs in mid-20s. intranasal/iv  . Sexual activity: Not Currently    Partners: Female, Male    Birth control/ protection: Condom     Comment: pt. declined condoms   Other Topics Concern  . Not on file   Social History Narrative  . No narrative on file    Labs: HIV 1 RNA Quant (copies/mL)  Date Value  05/25/2016 <20  12/30/2015 <20  07/05/2015 <20   CD4 T Cell Abs (/uL)  Date Value  05/25/2016 640  12/30/2015 800  02/22/2015 380 (L)   Hep B S Ab (no units)  Date Value  03/01/2016 INDETER (A)   Hepatitis B Surface Ag (no units)  Date Value  03/01/2016 NEGATIVE   HCV Ab (no units)  Date Value  03/01/2016 REACTIVE (A)    Lab Results  Component Value Date   HCVGENOTYPE 1a 05/03/2016    Hepatitis C RNA quantitative Latest Ref Rng & Units 03/01/2016  HCV Quantitative <15 IU/mL 24,399(H)  HCV Quantitative Log <1.18 log 10 4.39(H)    AST (U/L)  Date Value  05/25/2016 42 (H)  03/01/2016 53 (H)  02/07/2016 182 (H)   ALT (U/L)  Date Value  05/25/2016 90 (H)  05/03/2016 27  03/01/2016 89 (H)  02/07/2016 387 (H)   INR (no units)  Date Value  03/01/2016 0.96    CrCl: CrCl cannot be calculated (Unknown ideal weight.).  Fibrosis Score: F0 as assessed by Fibrosure  Child-Pugh Score: Class A  Previous Treatment Regimen: Naive  Assessment: Randy Castillo is here for his pharmacy visit and getting hep C VL. We were having a lot of issues bringing him in since he lives in [place]. He is on D24 of Harvoni now since he has about 4 tablets left. He has missed aboiut 3 doses due to being out of town and forgot it at home. He has not missed any ART though. I stressed to him several times that he should not miss any doses due to the  potential of building up resistance. While going over the [facility], he stated that his psych has decreased the Latuda down to 40mg  and dc the Symbyax. He come complains of headaches and nausea on Harvoni and has started taking BC powder since yesterday. Told him to stop BC power and use ibuprofen 400mg  up to 2-3x day as needed only for headaches. We are going to send in some zofran PRN nausea only.   Recommendations:  Cont Harvoni 1 PO qday x 49mo Zofran 4mg  q8 PRN x 20 tab Use PRN ibuprofen instead of BC powder F/u Dr. Ninetta Lights in Sept  Alfredia Desanctis [place], [place].D., BCPS, AAHIVP Clinical Infectious Disease Pharmacist [facility] 07/06/2016, 12:03 PM

## 2016-07-10 LAB — HEPATITIS C RNA QUANTITATIVE: HCV Quantitative: NOT DETECTED IU/mL (ref <15)

## 2016-07-17 ENCOUNTER — Other Ambulatory Visit: Payer: Self-pay | Admitting: Infectious Diseases

## 2016-08-01 ENCOUNTER — Telehealth: Payer: Self-pay | Admitting: *Deleted

## 2016-08-01 NOTE — Telephone Encounter (Signed)
Patient called, stated that his zofran is not working. He states he is having nausea with vomiting as a side effect of his medication. He has difficult keeping food down, states he weighs only 150 lbs now.   Patient is asking for promethazine. Please advise. Andree CossHowell, Samatha Anspach M, RN

## 2016-08-02 ENCOUNTER — Telehealth: Payer: Self-pay | Admitting: *Deleted

## 2016-08-02 ENCOUNTER — Other Ambulatory Visit: Payer: Self-pay | Admitting: *Deleted

## 2016-08-02 ENCOUNTER — Telehealth: Payer: Self-pay | Admitting: Pharmacist

## 2016-08-02 DIAGNOSIS — R112 Nausea with vomiting, unspecified: Secondary | ICD-10-CM

## 2016-08-02 MED ORDER — PROMETHAZINE HCL 25 MG PO TABS: 25.0000 mg | ORAL_TABLET | Freq: Three times a day (TID) | ORAL | 0 refills | Status: DC | PRN

## 2016-08-02 NOTE — Telephone Encounter (Signed)
Spoke with Randy Castillo(Brice). I told him we sent him some Phenergan to [facility]. He also explained to me his stool and blood issue - sounds like hemorrhoids.  I told him to monitor that for a few days and if he was still concerned and was still having blood when he wiped then he may need to go see his GI doctor again.  He also said he didn't have an appetite and I told him to try the Phenergan to see if it helped with the nausea and vomiting and monitor his appetite.

## 2016-08-02 NOTE — Telephone Encounter (Signed)
Patient called and advised his is still having sever nausea and has taken all if the Zofran 4 mg. Advised his PCP told him he could double the dose. He advised his has lost weight and is concerned because for 2 days now when he wipes there is blood on ht tissue. Bright red , no pain or straining to use the restroom and enough blood to make him concerned. He would like to have some phenergan in place of the Zofran and wants to know what to do about the blood in his stool. Advised will have the pharmacist call about the nausea and send the doctor a message about the blood in his stool. Will call him back as soon as I get a response.

## 2016-08-02 NOTE — Telephone Encounter (Signed)
Phenergan 25 mg q8h po prn.

## 2016-08-02 NOTE — Telephone Encounter (Signed)
Phenergan sent in, 25mg  q 8 hours, #30, no refills. Please cosign. Randy Castillo to call patient today with regards to his nausea and bright red blood per rectum.

## 2016-08-02 NOTE — Telephone Encounter (Signed)
I called him to tell him we sent in Phenergan and to get some more information about the blood in his stool/when he wipes. No answer.  Left a message and will follow up.

## 2016-08-09 ENCOUNTER — Telehealth: Payer: Self-pay | Admitting: *Deleted

## 2016-08-09 NOTE — Telephone Encounter (Signed)
Patietn left message in Triage, asking for pharmacy to call him back. Patient states he is still experiencing nausea despite the promethazine, is unable to eat. He would like someone to contact him back. He feels this is all due to the Harvoni. RN returned the call, notified the patient that the message will be forwarded to pharmacy.  Andree CossHowell, Michelle M, RN

## 2016-08-18 ENCOUNTER — Other Ambulatory Visit: Payer: Self-pay | Admitting: Pharmacist Clinician (PhC)/ Clinical Pharmacy Specialist

## 2016-08-18 ENCOUNTER — Telehealth: Payer: Self-pay | Admitting: Pharmacist Clinician (PhC)/ Clinical Pharmacy Specialist

## 2016-08-18 MED ORDER — ONDANSETRON HCL 4 MG PO TABS: 4.0000 mg | ORAL_TABLET | Freq: Every day | ORAL | 0 refills | Status: DC

## 2016-08-18 NOTE — Telephone Encounter (Signed)
Called Randy Castillo to check on his nausea issue. It's still there but he is doing a bit better. It usually happens in the morning. He is out of zofran now. Told him that I'll send in some for him an to take 1 zofran every morning until his hep C med is complete. He has not missed anymore doses since the last visit with pharmacy. Cont to use promethazine PRN for nausea. He is about 2.5 weeks out from completion of Harvoni.

## 2016-08-23 ENCOUNTER — Other Ambulatory Visit: Payer: Medicare Other

## 2016-08-30 ENCOUNTER — Ambulatory Visit: Payer: Medicare Other | Admitting: Infectious Diseases

## 2016-09-08 ENCOUNTER — Telehealth: Payer: Self-pay | Admitting: *Deleted

## 2016-09-08 NOTE — Telephone Encounter (Signed)
Please call patient regarding hepatitis c follow up. He states he finished his medication 9/27, is not scheduled to follow up with Dr. Ninetta LightsHatcher until 10/24. Please advise/contact patient if he should come in sooner for pharmacy or lab follow up. Andree CossHowell, Merryl Buckels M, RN

## 2016-09-11 ENCOUNTER — Telehealth: Payer: Self-pay | Admitting: Infectious Diseases

## 2016-09-11 ENCOUNTER — Telehealth: Payer: Self-pay | Admitting: Pharmacist Clinician (PhC)/ Clinical Pharmacy Specialist

## 2016-09-11 NOTE — Telephone Encounter (Signed)
error 

## 2016-09-11 NOTE — Telephone Encounter (Signed)
Called to bring him in for his EOT hep C VL since he finished last week. He still have some nausea so I explained it to him that it's likely not due to the medication then. He was wondering if Harvoni could cause liver cancer. Had to explain it to him that we are trying to prevent it. He is going to come in tomorrow for lab.

## 2016-09-12 ENCOUNTER — Other Ambulatory Visit: Payer: Self-pay | Admitting: Pharmacist

## 2016-09-12 ENCOUNTER — Other Ambulatory Visit: Payer: Medicare Other

## 2016-09-12 DIAGNOSIS — B182 Chronic viral hepatitis C: Secondary | ICD-10-CM | POA: Diagnosis not present

## 2016-09-12 DIAGNOSIS — B20 Human immunodeficiency virus [HIV] disease: Secondary | ICD-10-CM

## 2016-09-12 DIAGNOSIS — Z113 Encounter for screening for infections with a predominantly sexual mode of transmission: Secondary | ICD-10-CM

## 2016-09-12 LAB — COMPLETE METABOLIC PANEL WITH GFR
ALT: 15 U/L (ref 9–46)
AST: 21 U/L (ref 10–40)
Albumin: 3.6 g/dL (ref 3.6–5.1)
Alkaline Phosphatase: 70 U/L (ref 40–115)
BILIRUBIN TOTAL: 0.5 mg/dL (ref 0.2–1.2)
BUN: 14 mg/dL (ref 7–25)
CHLORIDE: 102 mmol/L (ref 98–110)
CO2: 28 mmol/L (ref 20–31)
Calcium: 9.1 mg/dL (ref 8.6–10.3)
Creat: 1.16 mg/dL (ref 0.60–1.35)
GFR, EST NON AFRICAN AMERICAN: 81 mL/min (ref >=60)
GLUCOSE: 86 mg/dL (ref 65–99)
POTASSIUM: 4 mmol/L (ref 3.5–5.3)
SODIUM: 136 mmol/L (ref 135–146)
TOTAL PROTEIN: 6.7 g/dL (ref 6.1–8.1)

## 2016-09-12 LAB — CBC
HCT: 40.5 % (ref 38.5–50.0)
HEMOGLOBIN: 14.1 g/dL (ref 13.2–17.1)
MCH: 31.1 pg (ref 27.0–33.0)
MCHC: 34.8 g/dL (ref 32.0–36.0)
MCV: 89.2 fL (ref 80.0–100.0)
MPV: 9.9 fL (ref 7.5–12.5)
PLATELETS: 282 10*3/uL (ref 140–400)
RBC: 4.54 MIL/uL (ref 4.20–5.80)
RDW: 13.8 % (ref 11.0–15.0)
WBC: 5.1 10*3/uL (ref 3.8–10.8)

## 2016-09-13 ENCOUNTER — Telehealth: Payer: Self-pay | Admitting: *Deleted

## 2016-09-13 LAB — HEPATITIS C RNA QUANTITATIVE: HCV Quantitative: NOT DETECTED IU/mL (ref <15)

## 2016-09-13 LAB — RPR TITER: RPR Titer: 1:32 {titer} — AB

## 2016-09-13 LAB — T-HELPER CELL (CD4) - (RCID CLINIC ONLY)
CD4 T CELL ABS: 700 /uL (ref 400–2700)
CD4 T CELL HELPER: 34 % (ref 33–55)

## 2016-09-13 LAB — RPR: RPR Ser Ql: REACTIVE — AB

## 2016-09-13 NOTE — Telephone Encounter (Signed)
-----   Message from Ginnie SmartJeffrey C Hatcher, MD sent at 09/13/2016 12:10 PM EDT ----- Pt needs penicillin 2.4 million units IM x 1.

## 2016-09-13 NOTE — Telephone Encounter (Signed)
Called patient, unable to leave a message (full voicemail). Will try again. Will also send MyChart message asking him to contact [facility] regarding lab results and follow up appointment. Andree CossHowell, Melisha Eggleton M, RN

## 2016-09-14 LAB — FLUORESCENT TREPONEMAL AB(FTA)-IGG-BLD: Fluorescent Treponemal ABS: REACTIVE — AB

## 2016-09-14 LAB — HIV-1 RNA QUANT-NO REFLEX-BLD: HIV-1 RNA Quant, Log: <1.3 Log copies/mL (ref <1.30)

## 2016-09-18 NOTE — Telephone Encounter (Signed)
Called at patient's request, left message asking him to call regarding lab results and appointment for treatment.

## 2016-10-03 ENCOUNTER — Encounter: Payer: Self-pay | Admitting: Infectious Diseases

## 2016-10-03 ENCOUNTER — Ambulatory Visit (INDEPENDENT_AMBULATORY_CARE_PROVIDER_SITE_OTHER): Payer: Medicare Other | Admitting: Infectious Diseases

## 2016-10-03 VITALS — BP 118/74 | HR 119 | Temp 98.4°F | Ht 71.0 in | Wt 157.0 lb

## 2016-10-03 DIAGNOSIS — F3113 Bipolar disorder, current episode manic without psychotic features, severe: Secondary | ICD-10-CM

## 2016-10-03 DIAGNOSIS — B182 Chronic viral hepatitis C: Secondary | ICD-10-CM

## 2016-10-03 DIAGNOSIS — K732 Chronic active hepatitis, not elsewhere classified: Secondary | ICD-10-CM

## 2016-10-03 DIAGNOSIS — A539 Syphilis, unspecified: Secondary | ICD-10-CM

## 2016-10-03 DIAGNOSIS — Z79899 Other long term (current) drug therapy: Secondary | ICD-10-CM

## 2016-10-03 DIAGNOSIS — R768 Other specified abnormal immunological findings in serum: Secondary | ICD-10-CM

## 2016-10-03 DIAGNOSIS — Z113 Encounter for screening for infections with a predominantly sexual mode of transmission: Secondary | ICD-10-CM

## 2016-10-03 DIAGNOSIS — B20 Human immunodeficiency virus [HIV] disease: Secondary | ICD-10-CM

## 2016-10-03 DIAGNOSIS — Z23 Encounter for immunization: Secondary | ICD-10-CM | POA: Diagnosis present

## 2016-10-03 MED ORDER — PENICILLIN G BENZATHINE 1200000 UNIT/2ML IM SUSP
1.2000 10*6.[IU] | Freq: Once | INTRAMUSCULAR | Status: AC
  Administered 2016-10-03: 1.2 10*6.[IU] via INTRAMUSCULAR

## 2016-10-03 MED ORDER — MEGESTROL ACETATE 400 MG/10ML PO SUSP: 400.0000 mg | Freq: Every day | ORAL | 3 refills | Status: DC

## 2016-10-03 MED ORDER — PENICILLIN G BENZATHINE 1200000 UNIT/2ML IM SUSP: 2.4000 10*6.[IU] | Freq: Once | INTRAMUSCULAR | Status: DC

## 2016-10-03 NOTE — Assessment & Plan Note (Addendum)
Doing well on his current ART except for wt loss Will add megace Has condoms Flu shot today rtc in 6 months.

## 2016-10-03 NOTE — Assessment & Plan Note (Signed)
Doing well on his current rx.  

## 2016-10-03 NOTE — Assessment & Plan Note (Signed)
Has had 2 negative Hep C RNA Will repeat at f/u, if negative mark as resolved.

## 2016-10-03 NOTE — Addendum Note (Signed)
Addended by: Andree CossHOWELL, Bintou Lafata M on: 10/03/2016 05:37 PM   Modules accepted: Orders

## 2016-10-03 NOTE — Progress Notes (Signed)
   Subjective:    Patient ID: Randy Castillo, male    DOB: 01/16/1981, 35 y.o.   MRN: [id number]  HPI 35 yo M with newly dx HIV+ 01-2015 and bipolar d/o. He was was started on genvoya 01-2015.  Newly dx with Hep C in intervening period. VL 24,399. F0. He was treated with harvoni and his repeat Hep C RNA have been negative.  His ART was changed to descovy/tivicay. He wishes to continue taking this due to food restrictions. He is on latuda.  HIV 1 RNA Quant (copies/mL)  Date Value  09/12/2016 <20  05/25/2016 <20  12/30/2015 <20   CD4 T Cell Abs (/uL)  Date Value  09/12/2016 700  05/25/2016 640  12/30/2015 800    Review of Systems  Constitutional: Positive for appetite change and unexpected weight change.  Gastrointestinal: Positive for constipation. Negative for diarrhea.  Genitourinary: Negative for difficulty urinating.  Not hungry. Wants appetite stimulant.   He has a new RPR+ 1:32, prev negative this year.     Objective:   Physical Exam  Constitutional: He appears well-developed and well-nourished.  HENT:  Mouth/Throat: No oropharyngeal exudate.  Eyes: EOM are normal. Pupils are equal, round, and reactive to light.  Neck: Neck supple.  Cardiovascular: Normal rate, regular rhythm and normal heart sounds.   Pulmonary/Chest: Effort normal and breath sounds normal.  Abdominal: Soft. Bowel sounds are normal. There is no tenderness. There is no rebound.  Musculoskeletal: He exhibits no edema.  Lymphadenopathy:    He has no cervical adenopathy.  Psychiatric: He has a normal mood and affect.      Assessment & Plan:

## 2016-10-03 NOTE — Addendum Note (Signed)
Addended by: Andree CossHOWELL, Thai Hemrick M on: 10/03/2016 05:48 PM   Modules accepted: Orders

## 2016-10-06 ENCOUNTER — Telehealth: Payer: Self-pay | Admitting: *Deleted

## 2016-10-06 NOTE — Telephone Encounter (Signed)
Patient has questions re: new medications.  He would appreciate a phone call from either Dr. Ninetta LightsHatcher or the pharmacist to discuss.

## 2016-10-09 ENCOUNTER — Telehealth: Payer: Self-pay | Admitting: Pharmacist Clinician (PhC)/ Clinical Pharmacy Specialist

## 2016-10-09 NOTE — Telephone Encounter (Signed)
Randy SensingBrice called with some questions about his Megace. He was just asking if it's ok since he is taking a lot of meds. Told him that it would be ok to take.

## 2016-10-12 DIAGNOSIS — Z23 Encounter for immunization: Secondary | ICD-10-CM | POA: Insufficient documentation

## 2016-12-08 DIAGNOSIS — F3170 Bipolar disorder, currently in remission, most recent episode unspecified: Secondary | ICD-10-CM | POA: Insufficient documentation

## 2016-12-08 DIAGNOSIS — F1120 Opioid dependence, uncomplicated: Secondary | ICD-10-CM | POA: Insufficient documentation

## 2016-12-18 ENCOUNTER — Other Ambulatory Visit: Payer: Self-pay | Admitting: Infectious Diseases

## 2016-12-18 DIAGNOSIS — B20 Human immunodeficiency virus [HIV] disease: Secondary | ICD-10-CM

## 2017-01-09 DIAGNOSIS — F17210 Nicotine dependence, cigarettes, uncomplicated: Secondary | ICD-10-CM | POA: Insufficient documentation

## 2017-01-17 ENCOUNTER — Telehealth: Payer: Self-pay | Admitting: *Deleted

## 2017-01-17 NOTE — Telephone Encounter (Signed)
Patient called with symptoms of STD, asked for advice since his follow up appointment is march/april.  RN advised patient to call the health department - they should see him within 24 hours as he is symptomatic. RN educated patient to wear condoms EVERY time, not only for anal intercourse.  Randy Castillo, Marque Rademaker M, RN

## 2017-01-17 NOTE — Telephone Encounter (Signed)
Thank you for you

## 2017-01-29 DIAGNOSIS — Z113 Encounter for screening for infections with a predominantly sexual mode of transmission: Secondary | ICD-10-CM | POA: Diagnosis not present

## 2017-03-07 ENCOUNTER — Other Ambulatory Visit: Payer: Medicare Other

## 2017-03-07 DIAGNOSIS — Z113 Encounter for screening for infections with a predominantly sexual mode of transmission: Secondary | ICD-10-CM | POA: Diagnosis not present

## 2017-03-07 DIAGNOSIS — R768 Other specified abnormal immunological findings in serum: Secondary | ICD-10-CM

## 2017-03-07 DIAGNOSIS — Z79899 Other long term (current) drug therapy: Secondary | ICD-10-CM | POA: Diagnosis not present

## 2017-03-07 DIAGNOSIS — Z23 Encounter for immunization: Secondary | ICD-10-CM | POA: Diagnosis not present

## 2017-03-07 DIAGNOSIS — B20 Human immunodeficiency virus [HIV] disease: Secondary | ICD-10-CM | POA: Diagnosis not present

## 2017-03-07 DIAGNOSIS — B182 Chronic viral hepatitis C: Secondary | ICD-10-CM | POA: Diagnosis not present

## 2017-03-07 LAB — CBC
HEMATOCRIT: 41.9 % (ref 38.5–50.0)
HEMOGLOBIN: 14.2 g/dL (ref 13.2–17.1)
MCH: 31.8 pg (ref 27.0–33.0)
MCHC: 33.9 g/dL (ref 32.0–36.0)
MCV: 93.7 fL (ref 80.0–100.0)
MPV: 10.1 fL (ref 7.5–12.5)
Platelets: 198 10*3/uL (ref 140–400)
RBC: 4.47 MIL/uL (ref 4.20–5.80)
RDW: 13.8 % (ref 11.0–15.0)
WBC: 5.9 10*3/uL (ref 3.8–10.8)

## 2017-03-08 LAB — LIPID PANEL
CHOL/HDL RATIO: 2.9 ratio (ref <5.0)
CHOLESTEROL: 123 mg/dL (ref <200)
HDL: 42 mg/dL (ref >40)
LDL Cholesterol: 66 mg/dL (ref <100)
TRIGLYCERIDES: 76 mg/dL (ref <150)
VLDL: 15 mg/dL (ref <30)

## 2017-03-08 LAB — T-HELPER CELL (CD4) - (RCID CLINIC ONLY)
CD4 % Helper T Cell: 37 % (ref 33–55)
CD4 T Cell Abs: 630 /uL (ref 400–2700)

## 2017-03-08 LAB — COMPREHENSIVE METABOLIC PANEL
ALBUMIN: 4.1 g/dL (ref 3.6–5.1)
ALK PHOS: 58 U/L (ref 40–115)
ALT: 13 U/L (ref 9–46)
AST: 21 U/L (ref 10–40)
BILIRUBIN TOTAL: 0.3 mg/dL (ref 0.2–1.2)
BUN: 13 mg/dL (ref 7–25)
CALCIUM: 9.3 mg/dL (ref 8.6–10.3)
CO2: 23 mmol/L (ref 20–31)
Chloride: 102 mmol/L (ref 98–110)
Creat: 1.04 mg/dL (ref 0.60–1.35)
GLUCOSE: 87 mg/dL (ref 65–99)
POTASSIUM: 4.1 mmol/L (ref 3.5–5.3)
Sodium: 138 mmol/L (ref 135–146)
Total Protein: 6.9 g/dL (ref 6.1–8.1)

## 2017-03-08 LAB — RPR TITER

## 2017-03-08 LAB — RPR: RPR Ser Ql: REACTIVE — AB

## 2017-03-08 LAB — FLUORESCENT TREPONEMAL AB(FTA)-IGG-BLD: FLUORESCENT TREPONEMAL ABS: REACTIVE — AB

## 2017-03-09 LAB — HEPATITIS C RNA QUANTITATIVE
HCV Quantitative Log: <1.18 Log IU/mL
HCV Quantitative: <15 IU/mL

## 2017-03-09 LAB — HIV-1 RNA QUANT-NO REFLEX-BLD
HIV 1 RNA Quant: <20 copies/mL
HIV-1 RNA Quant, Log: <1.3 Log copies/mL

## 2017-03-14 ENCOUNTER — Ambulatory Visit: Payer: Medicare Other | Admitting: Infectious Diseases

## 2017-03-16 ENCOUNTER — Other Ambulatory Visit: Payer: Self-pay | Admitting: Infectious Diseases

## 2017-03-16 DIAGNOSIS — B20 Human immunodeficiency virus [HIV] disease: Secondary | ICD-10-CM

## 2017-03-21 ENCOUNTER — Ambulatory Visit: Payer: Medicare Other | Admitting: Infectious Diseases

## 2017-03-22 ENCOUNTER — Ambulatory Visit: Payer: Medicare Other | Admitting: Infectious Diseases

## 2017-04-05 ENCOUNTER — Telehealth: Payer: Self-pay | Admitting: *Deleted

## 2017-04-05 NOTE — Telephone Encounter (Signed)
Health department notified. Wendall Mola CMA

## 2017-04-05 NOTE — Telephone Encounter (Signed)
No tx thanks

## 2017-04-05 NOTE — Telephone Encounter (Signed)
Call from [facility] with the Health Department regarding an RPR titer of 1:1 from 03/07/17. Asking if patient needs treatment; please advise. Wendall Mola CMA

## 2017-05-02 DIAGNOSIS — J301 Allergic rhinitis due to pollen: Secondary | ICD-10-CM | POA: Insufficient documentation

## 2017-05-02 DIAGNOSIS — A6001 Herpesviral infection of penis: Secondary | ICD-10-CM | POA: Insufficient documentation

## 2017-05-16 ENCOUNTER — Other Ambulatory Visit: Payer: Self-pay | Admitting: Infectious Diseases

## 2017-05-16 DIAGNOSIS — B20 Human immunodeficiency virus [HIV] disease: Secondary | ICD-10-CM

## 2017-06-11 ENCOUNTER — Other Ambulatory Visit: Payer: Self-pay | Admitting: *Deleted

## 2017-06-11 DIAGNOSIS — B20 Human immunodeficiency virus [HIV] disease: Secondary | ICD-10-CM

## 2017-06-11 MED ORDER — EMTRICITABINE-TENOFOVIR AF 200-25 MG PO TABS: 1.0000 | ORAL_TABLET | Freq: Every day | ORAL | 0 refills | Status: DC

## 2017-06-11 MED ORDER — DOLUTEGRAVIR SODIUM 50 MG PO TABS: ORAL_TABLET | ORAL | 0 refills | Status: DC

## 2017-06-13 ENCOUNTER — Other Ambulatory Visit: Payer: Self-pay | Admitting: Infectious Disease

## 2017-06-13 DIAGNOSIS — B20 Human immunodeficiency virus [HIV] disease: Secondary | ICD-10-CM

## 2017-06-14 ENCOUNTER — Other Ambulatory Visit: Payer: Medicare Other

## 2017-06-14 DIAGNOSIS — Z113 Encounter for screening for infections with a predominantly sexual mode of transmission: Secondary | ICD-10-CM | POA: Diagnosis not present

## 2017-06-14 DIAGNOSIS — B20 Human immunodeficiency virus [HIV] disease: Secondary | ICD-10-CM

## 2017-06-14 LAB — COMPREHENSIVE METABOLIC PANEL
ALBUMIN: 4.5 g/dL (ref 3.6–5.1)
ALK PHOS: 59 U/L (ref 40–115)
ALT: 12 U/L (ref 9–46)
AST: 17 U/L (ref 10–40)
BILIRUBIN TOTAL: 0.4 mg/dL (ref 0.2–1.2)
BUN: 14 mg/dL (ref 7–25)
CALCIUM: 9.6 mg/dL (ref 8.6–10.3)
CO2: 26 mmol/L (ref 20–31)
Chloride: 102 mmol/L (ref 98–110)
Creat: 1.02 mg/dL (ref 0.60–1.35)
Glucose, Bld: 99 mg/dL (ref 65–99)
Potassium: 4.1 mmol/L (ref 3.5–5.3)
Sodium: 138 mmol/L (ref 135–146)
TOTAL PROTEIN: 7 g/dL (ref 6.1–8.1)

## 2017-06-14 LAB — CBC WITH DIFFERENTIAL/PLATELET
BASOS ABS: 65 {cells}/uL (ref 0–200)
Basophils Relative: 1 %
EOS ABS: 65 {cells}/uL (ref 15–500)
EOS PCT: 1 %
HCT: 41.8 % (ref 38.5–50.0)
HEMOGLOBIN: 14.3 g/dL (ref 13.2–17.1)
Lymphocytes Relative: 39 %
Lymphs Abs: 2535 cells/uL (ref 850–3900)
MCH: 31.8 pg (ref 27.0–33.0)
MCHC: 34.2 g/dL (ref 32.0–36.0)
MCV: 93.1 fL (ref 80.0–100.0)
MONOS PCT: 8 %
MPV: 9.6 fL (ref 7.5–12.5)
Monocytes Absolute: 520 cells/uL (ref 200–950)
NEUTROS PCT: 51 %
Neutro Abs: 3315 cells/uL (ref 1500–7800)
PLATELETS: 250 10*3/uL (ref 140–400)
RBC: 4.49 MIL/uL (ref 4.20–5.80)
RDW: 14.6 % (ref 11.0–15.0)
WBC: 6.5 10*3/uL (ref 3.8–10.8)

## 2017-06-15 LAB — T-HELPER CELL (CD4) - (RCID CLINIC ONLY)
CD4 % Helper T Cell: 38 % (ref 33–55)
CD4 T Cell Abs: 990 /uL (ref 400–2700)

## 2017-06-15 LAB — RPR TITER: RPR Titer: 1:1 {titer}

## 2017-06-15 LAB — RPR: RPR: REACTIVE — AB

## 2017-06-18 LAB — HIV-1 RNA QUANT-NO REFLEX-BLD
HIV 1 RNA Quant: <20 copies/mL
HIV-1 RNA Quant, Log: <1.3 Log copies/mL

## 2017-06-18 LAB — FLUORESCENT TREPONEMAL AB(FTA)-IGG-BLD: Fluorescent Treponemal ABS: REACTIVE — AB
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
## 2017-07-08 ENCOUNTER — Other Ambulatory Visit: Payer: Self-pay | Admitting: Infectious Diseases

## 2017-07-08 DIAGNOSIS — B20 Human immunodeficiency virus [HIV] disease: Secondary | ICD-10-CM

## 2017-08-24 ENCOUNTER — Other Ambulatory Visit: Payer: Self-pay | Admitting: Infectious Diseases

## 2017-08-24 DIAGNOSIS — B20 Human immunodeficiency virus [HIV] disease: Secondary | ICD-10-CM

## 2017-08-28 ENCOUNTER — Ambulatory Visit: Payer: Medicare Other | Admitting: Infectious Diseases

## 2017-08-29 ENCOUNTER — Ambulatory Visit: Payer: Medicare Other | Admitting: Infectious Diseases

## 2017-08-29 ENCOUNTER — Other Ambulatory Visit: Payer: Self-pay | Admitting: Infectious Diseases

## 2017-08-29 ENCOUNTER — Telehealth: Payer: Self-pay

## 2017-08-29 DIAGNOSIS — B20 Human immunodeficiency virus [HIV] disease: Secondary | ICD-10-CM

## 2017-08-29 NOTE — Telephone Encounter (Signed)
Patient will not be allowed to schedule office visits.  He will need to walk into clinic and see pharmacist prior to any medication refills.   Laurell Josephs, RN

## 2017-09-17 ENCOUNTER — Emergency Department (HOSPITAL_COMMUNITY)
Admission: EM | Admit: 2017-09-17 | Discharge: 2017-09-17 | Discharge status: Against Medical Advice | Payer: Medicare Other | Attending: Emergency Medicine | Admitting: Emergency Medicine

## 2017-09-17 ENCOUNTER — Encounter (HOSPITAL_COMMUNITY): Payer: Self-pay

## 2017-09-17 DIAGNOSIS — F419 Anxiety disorder, unspecified: Secondary | ICD-10-CM | POA: Diagnosis not present

## 2017-09-17 DIAGNOSIS — Z5321 Procedure and treatment not carried out due to patient leaving prior to being seen by health care provider: Secondary | ICD-10-CM | POA: Diagnosis not present

## 2017-09-17 DIAGNOSIS — R069 Unspecified abnormalities of breathing: Secondary | ICD-10-CM | POA: Diagnosis not present

## 2017-09-17 DIAGNOSIS — R5383 Other fatigue: Secondary | ICD-10-CM | POA: Diagnosis not present

## 2017-09-17 NOTE — ED Notes (Signed)
Called no answer
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
## 2017-09-21 ENCOUNTER — Other Ambulatory Visit: Payer: Self-pay

## 2017-09-21 ENCOUNTER — Emergency Department (HOSPITAL_COMMUNITY)
Admission: EM | Admit: 2017-09-21 | Discharge: 2017-09-21 | Payer: Medicare Other | Attending: Emergency Medicine | Admitting: Emergency Medicine

## 2017-09-21 ENCOUNTER — Emergency Department (HOSPITAL_COMMUNITY): Payer: Medicare Other

## 2017-09-21 ENCOUNTER — Encounter (HOSPITAL_COMMUNITY): Payer: Self-pay | Admitting: Emergency Medicine

## 2017-09-21 DIAGNOSIS — F17210 Nicotine dependence, cigarettes, uncomplicated: Secondary | ICD-10-CM | POA: Diagnosis not present

## 2017-09-21 DIAGNOSIS — B20 Human immunodeficiency virus [HIV] disease: Secondary | ICD-10-CM | POA: Insufficient documentation

## 2017-09-21 DIAGNOSIS — I482 Chronic atrial fibrillation: Secondary | ICD-10-CM | POA: Diagnosis not present

## 2017-09-21 DIAGNOSIS — Z79899 Other long term (current) drug therapy: Secondary | ICD-10-CM | POA: Insufficient documentation

## 2017-09-21 DIAGNOSIS — I10 Essential (primary) hypertension: Secondary | ICD-10-CM | POA: Diagnosis not present

## 2017-09-21 DIAGNOSIS — R0602 Shortness of breath: Secondary | ICD-10-CM | POA: Diagnosis not present

## 2017-09-21 DIAGNOSIS — M791 Myalgia, unspecified site: Secondary | ICD-10-CM | POA: Diagnosis not present

## 2017-09-21 DIAGNOSIS — J45909 Unspecified asthma, uncomplicated: Secondary | ICD-10-CM | POA: Insufficient documentation

## 2017-09-21 DIAGNOSIS — R0789 Other chest pain: Secondary | ICD-10-CM | POA: Diagnosis not present

## 2017-09-21 DIAGNOSIS — R079 Chest pain, unspecified: Secondary | ICD-10-CM | POA: Diagnosis not present

## 2017-09-21 HISTORY — DX: Opioid dependence, uncomplicated: F11.20

## 2017-09-21 LAB — COMPREHENSIVE METABOLIC PANEL
ALBUMIN: 4.1 g/dL (ref 3.5–5.0)
ALK PHOS: 49 U/L (ref 38–126)
ALT: 21 U/L (ref 17–63)
AST: 38 U/L (ref 15–41)
Anion gap: 10 (ref 5–15)
BILIRUBIN TOTAL: 1 mg/dL (ref 0.3–1.2)
BUN: 22 mg/dL — AB (ref 6–20)
CALCIUM: 9 mg/dL (ref 8.9–10.3)
CO2: 24 mmol/L (ref 22–32)
Chloride: 101 mmol/L (ref 101–111)
Creatinine, Ser: 0.81 mg/dL (ref 0.61–1.24)
GFR calc Af Amer: >60 mL/min (ref >60)
GFR calc non Af Amer: >60 mL/min (ref >60)
GLUCOSE: 89 mg/dL (ref 65–99)
POTASSIUM: 4 mmol/L (ref 3.5–5.1)
Sodium: 135 mmol/L (ref 135–145)
TOTAL PROTEIN: 7.2 g/dL (ref 6.5–8.1)

## 2017-09-21 LAB — CBC WITH DIFFERENTIAL/PLATELET
Basophils Absolute: 0 10*3/uL (ref 0.0–0.1)
Basophils Relative: 1 %
EOS ABS: 0.1 10*3/uL (ref 0.0–0.7)
EOS PCT: 2 %
HCT: 42.4 % (ref 39.0–52.0)
Hemoglobin: 14.7 g/dL (ref 13.0–17.0)
LYMPHS ABS: 2.3 10*3/uL (ref 0.7–4.0)
Lymphocytes Relative: 36 %
MCH: 32.5 pg (ref 26.0–34.0)
MCHC: 34.7 g/dL (ref 30.0–36.0)
MCV: 93.8 fL (ref 78.0–100.0)
MONOS PCT: 9 %
Monocytes Absolute: 0.5 10*3/uL (ref 0.1–1.0)
Neutro Abs: 3.3 10*3/uL (ref 1.7–7.7)
Neutrophils Relative %: 52 %
PLATELETS: 198 10*3/uL (ref 150–400)
RBC: 4.52 MIL/uL (ref 4.22–5.81)
RDW: 12.8 % (ref 11.5–15.5)
WBC: 6.3 10*3/uL (ref 4.0–10.5)

## 2017-09-21 LAB — URINALYSIS, ROUTINE W REFLEX MICROSCOPIC
BILIRUBIN URINE: NEGATIVE
Glucose, UA: NEGATIVE mg/dL
HGB URINE DIPSTICK: NEGATIVE
KETONES UR: 20 mg/dL — AB
Leukocytes, UA: NEGATIVE
Nitrite: NEGATIVE
PROTEIN: NEGATIVE mg/dL
Specific Gravity, Urine: 1.019 (ref 1.005–1.030)
pH: 5 (ref 5.0–8.0)

## 2017-09-21 LAB — RAPID URINE DRUG SCREEN, HOSP PERFORMED
Amphetamines: NOT DETECTED
Barbiturates: NOT DETECTED
Benzodiazepines: POSITIVE — AB
Cocaine: NOT DETECTED
OPIATES: NOT DETECTED
Tetrahydrocannabinol: NOT DETECTED

## 2017-09-21 LAB — TROPONIN I
Troponin I: <0.03 ng/mL (ref <0.03)
Troponin I: <0.03 ng/mL (ref <0.03)

## 2017-09-21 MED ORDER — SODIUM CHLORIDE 0.9 % IV BOLUS (SEPSIS)
1000.0000 mL | Freq: Once | INTRAVENOUS | Status: AC
  Administered 2017-09-21: 1000 mL via INTRAVENOUS

## 2017-09-21 MED ORDER — IOPAMIDOL (ISOVUE-370) INJECTION 76%
100.0000 mL | Freq: Once | INTRAVENOUS | Status: AC | PRN
  Administered 2017-09-21: 100 mL via INTRAVENOUS

## 2017-09-21 NOTE — Discharge Instructions (Signed)
Take your usual prescriptions as previously directed. Increase your fluid intake for the next several days. Your CT scan showed an incidental finding:  "Nonspecific 3 mm RIGHT upper lobe pulmonary nodule, recommendation:  No follow-up needed if patient is low-risk. Non-contrast chest CT can be considered in 12 months if patient is high-risk. This recommendation follows the consensus statement: Guidelines for Management of Incidental Pulmonary Nodules Detected on CT Images: From the Fleischner Society 2017; Radiology 2017; [phone]."  Call your regular medical doctor on Monday to schedule a follow up appointment next week for this finding. Return to the Emergency Department sooner if worsening.

## 2017-09-21 NOTE — ED Triage Notes (Signed)
Pt from jail. C/o cp. Received 4 baby asa and one ntg with no relief. Was ST in route. Pt a/o. NAD

## 2017-09-21 NOTE — ED Provider Notes (Signed)
[facility]Y DEPT Provider Note   CSN: [id number] Arrival date & time: 09/21/17  1410     History   Chief Complaint Chief Complaint  Patient presents with  . Generalized Body Aches    HPI Randy Castillo is a 36 y.o. male.  HPI  Pt was seen at 1425. Per EMS and pt report, c/o gradual onset and persistence of constant SOB for the past 1 month. EMS states pt's original call out was for chest pain; received ASA and 1 SL Ntg without change in that complaint. Pt denies any complaints of CP to ED staff on arrival and only c/o SOB x1 month; "since I've been in jail." Denies CP/palpitations, no cough, no abd pain, no N/V/D, no focal motor weakness.     Past Medical History:  Diagnosis Date  . Anxiety   . Asthma   . Bipolar affective disorder, manic, in partial or remission (HCC)   . Environmental allergies   . Gonorrhea in male   . Herpes   . HIV disease (HCC)   . Hypertension   . Migraine   . Neuropathic pain   . Opioid dependence on agonist therapy (HCC)   . PTSD (post-traumatic stress disorder)   . Seizures [facility])     Patient Active Problem List   Diagnosis Date Noted  . Rash and nonspecific skin eruption 05/25/2016  . Hepatitis, chronic active (HCC) 05/03/2016  . RUQ pain 02/24/2016  . Abnormal LFTs 02/24/2016  . Lower abdominal pain 02/24/2016  . HIV disease (HCC) 01/26/2015  . Bipolar disorder (HCC) 12/31/2014  . ADHD (attention deficit hyperactivity disorder) 12/31/2014  . Asthma with acute exacerbation 12/31/2014  . Herpes zoster 12/31/2014  . Generalized anxiety disorder 12/31/2014  . Scapula fracture 05/17/2012  . Pain in joint, shoulder region 05/17/2012    Past Surgical History:  Procedure Laterality Date  . CYST EXCISION    . ESOPHAGOGASTRODUODENOSCOPY  2008   Dr. Darrick Penna: Erosive reflux esophagitis, gastritis, small bowel biopsy negative for celiac, gastric biopsy negative for H. pylori       Home Medications    Prior to Admission  medications   Medication Sig Start Date End Date Taking? Authorizing Provider  albuterol (PROVENTIL HFA;VENTOLIN HFA) 108 (90 Base) MCG/ACT inhaler Inhale 2 puffs into the lungs every 6 (six) hours as needed for wheezing. 05/12/16   Ginnie Smart, MD  alprazolam Prudy Feeler) 2 MG tablet Take 1 mg by mouth 3 (three) times daily as needed for sleep or anxiety.     [provider]  amphetamine-dextroamphetamine (ADDERALL) 20 MG tablet Take 20 mg by mouth 4 (four) times daily.    [provider]  buprenorphine (SUBUTEX) 8 MG SUBL SL tablet Place under the tongue. 09/12/16   [provider]  DESCOVY 200-25 MG tablet TAKE 1 TABLET BY MOUTH DAILY 06/14/17   Ginnie Smart, MD  dolutegravir (TIVICAY) 50 MG tablet TAKE 1 TABLET(50 MG) BY MOUTH DAILY 06/11/17   Ginnie Smart, MD  emtricitabine-tenofovir AF (DESCOVY) 200-25 MG tablet Take 1 tablet by mouth daily. 06/11/17   Ginnie Smart, MD  LATUDA 80 MG TABS tablet TK 1 T PO  QD 09/11/16   [provider]  lisinopril-hydrochlorothiazide (PRINZIDE,ZESTORETIC) 10-12.5 MG tablet Take 1 tablet by mouth daily. 08/18/15   [provider]  megestrol (MEGACE) 40 MG/ML suspension SHAKE LIQUID AND TAKE 10 ML(400 MG) BY MOUTH DAILY 12/18/16   Ginnie Smart, MD  ondansetron (ZOFRAN) 4 MG tablet Take 1 tablet (  4 mg total) by mouth daily. 08/18/16   Ginnie Smart, MD  promethazine (PHENERGAN) 25 MG tablet Take 1 tablet (25 mg total) by mouth every 8 (eight) hours as needed for nausea or vomiting. 08/02/16   Ginnie Smart, MD  traZODone (DESYREL) 50 MG tablet Take 50 mg by mouth at bedtime. 1-2 tablets by mouth every night at bedtime    [provider]  valACYclovir (VALTREX) 1000 MG tablet Take 1,000 mg by mouth daily. *MAY TAKE ONE TABLET IN ADDITION AS NEEDED    [provider]    Family History Family History  Problem Relation Age of Onset  . Family history unknown: Yes    Social  History Social History  Substance Use Topics  . Smoking status: Current Every Day Smoker    Packs/day: 1.00    Years: 16.00    Types: Cigarettes    Start date: 12/11/1994  . Smokeless tobacco: Never Used     Comment: Is vaping to help reduce cigarette use  . Alcohol use No     Allergies   Zanaflex [tizanidine hcl]; Diclofenac sodium; Naloxone; Propoxyphene; and Darvocet [propoxyphene n-acetaminophen]   Review of Systems Review of Systems ROS: Statement: All systems negative except as marked or noted in the HPI; Constitutional: Negative for fever and chills. ; ; Eyes: Negative for eye pain, redness and discharge. ; ; ENMT: Negative for ear pain, hoarseness, nasal congestion, sinus pressure and sore throat. ; ; Cardiovascular: Negative for chest pain, palpitations, diaphoresis, and peripheral edema. ; ; Respiratory: +SOB. Negative for cough, wheezing and stridor. ; ; Gastrointestinal: Negative for nausea, vomiting, diarrhea, abdominal pain, blood in stool, hematemesis, jaundice and rectal bleeding. . ; ; Genitourinary: Negative for dysuria, flank pain and hematuria. ; ; Musculoskeletal: Negative for back pain and neck pain. Negative for swelling and trauma.; ; Skin: Negative for pruritus, rash, abrasions, blisters, bruising and skin lesion.; ; Neuro: Negative for headache, lightheadedness and neck stiffness. Negative for weakness, altered level of consciousness, altered mental status, extremity weakness, paresthesias, involuntary movement, seizure and syncope.      Physical Exam Updated Vital Signs BP 116/83 (BP Location: Right Arm)   Pulse (!) 130   Temp 98.4 F (36.9 C) (Oral)   Resp 17   SpO2 97%    Patient Vitals for the past 24 hrs:  BP Temp Temp src Pulse Resp SpO2  09/21/17 1715 - - - (!) 106 (!) 21 100 %  09/21/17 1700 138/77 - - (!) 105 (!) 29 98 %  09/21/17 1630 - - - (!) 102 18 99 %  09/21/17 1530 110/89 - - 95 14 100 %  09/21/17 1500 101/72 - - (!) 122 (!) 38 100 %   09/21/17 1445 - - - (!) 102 20 97 %  09/21/17 1430 (!) 120/96 - - (!) 121 19 100 %  09/21/17 1416 116/83 98.4 F (36.9 C) Oral (!) 130 17 97 %     Physical Exam 1430: Physical examination:  Nursing notes reviewed; Vital signs and O2 SAT reviewed;  Constitutional: Well developed, Well nourished, Well hydrated, In no acute distress; Head:  Normocephalic, atraumatic; Eyes: EOMI, PERRL, No scleral icterus; ENMT: Mouth and pharynx normal, Mucous membranes moist; Neck: Supple, Full range of motion, No lymphadenopathy; Cardiovascular: Tachycardic rate and rhythm, No gallop; Respiratory: Breath sounds clear & equal bilaterally, No wheezes.  Speaking full sentences with ease, Normal respiratory effort/excursion; Chest: Nontender, Movement normal; Abdomen: Soft, Nontender, Nondistended, Normal bowel sounds; Genitourinary: No  CVA tenderness; Extremities: Pulses normal, No tenderness, No edema, No calf edema or asymmetry.; Neuro: AA&Ox3, tangential and varying historian. Major CN grossly intact. No facial droop. Speech clear. No gross focal motor or sensory deficits in extremities.; Skin: Color normal, Warm, Dry.   ED Treatments / Results  Labs (all labs ordered are listed, but only abnormal results are displayed)   EKG  EKG Interpretation  Date/Time:  Friday September 21 2017 14:20:48 EDT Ventricular Rate:  112 PR Interval:    QRS Duration: 87 QT Interval:  327 QTC Calculation: 447 R Axis:   78 Text Interpretation:  Sinus tachycardia RSR' in V1 or V2, right VCD or RVH When compared with ECG of 03/18/2012 No significant change was found Confirmed by Samuel Jester [phone]) on 09/21/2017 2:50:33 PM       Radiology   Procedures Procedures (including critical care time)  Medications Ordered in ED Medications  sodium chloride 0.9 % bolus 1,000 mL (not administered)     Initial Impression / Assessment and Plan / ED Course  I have reviewed the triage vital signs and the nursing  notes.  Pertinent labs & imaging results that were available during my care of the patient were reviewed by me and considered in my medical decision making (see chart for details).  MDM Reviewed: previous chart, nursing note and vitals Reviewed previous: labs and ECG Interpretation: labs, ECG and CT scan   Results for orders placed or performed during the hospital encounter of 09/21/17  Troponin I  Result Value Ref Range   Troponin I <0.03 <0.03 ng/mL  CBC with Differential  Result Value Ref Range   WBC 6.3 4.0 - 10.5 K/uL   RBC 4.52 4.22 - 5.81 MIL/uL   Hemoglobin 14.7 13.0 - 17.0 g/dL   HCT 60.4 54.0 - 98.1 %   MCV 93.8 78.0 - 100.0 fL   MCH 32.5 26.0 - 34.0 pg   MCHC 34.7 30.0 - 36.0 g/dL   RDW 19.1 47.8 - 29.5 %   Platelets 198 150 - 400 K/uL   Neutrophils Relative % 52 %   Neutro Abs 3.3 1.7 - 7.7 K/uL   Lymphocytes Relative 36 %   Lymphs Abs 2.3 0.7 - 4.0 K/uL   Monocytes Relative 9 %   Monocytes Absolute 0.5 0.1 - 1.0 K/uL   Eosinophils Relative 2 %   Eosinophils Absolute 0.1 0.0 - 0.7 K/uL   Basophils Relative 1 %   Basophils Absolute 0.0 0.0 - 0.1 K/uL  Urinalysis, Routine w reflex microscopic  Result Value Ref Range   Color, Urine YELLOW YELLOW   APPearance CLEAR CLEAR   Specific Gravity, Urine 1.019 1.005 - 1.030   pH 5.0 5.0 - 8.0   Glucose, UA NEGATIVE NEGATIVE mg/dL   Hgb urine dipstick NEGATIVE NEGATIVE   Bilirubin Urine NEGATIVE NEGATIVE   Ketones, ur 20 (A) NEGATIVE mg/dL   Protein, ur NEGATIVE NEGATIVE mg/dL   Nitrite NEGATIVE NEGATIVE   Leukocytes, UA NEGATIVE NEGATIVE  Urine rapid drug screen (hosp performed)  Result Value Ref Range   Opiates NONE DETECTED NONE DETECTED   Cocaine NONE DETECTED NONE DETECTED   Benzodiazepines POSITIVE (A) NONE DETECTED   Amphetamines NONE DETECTED NONE DETECTED   Tetrahydrocannabinol NONE DETECTED NONE DETECTED   Barbiturates NONE DETECTED NONE DETECTED  Comprehensive metabolic panel  Result Value Ref Range    Sodium 135 135 - 145 mmol/L   Potassium 4.0 3.5 - 5.1 mmol/L   Chloride 101 101 - 111 mmol/L  CO2 24 22 - 32 mmol/L   Glucose, Bld 89 65 - 99 mg/dL   BUN 22 (H) 6 - 20 mg/dL   Creatinine, Ser 1.61 0.61 - 1.24 mg/dL   Calcium 9.0 8.9 - 09.6 mg/dL   Total Protein 7.2 6.5 - 8.1 g/dL   Albumin 4.1 3.5 - 5.0 g/dL   AST 38 15 - 41 U/L   ALT 21 17 - 63 U/L   Alkaline Phosphatase 49 38 - 126 U/L   Total Bilirubin 1.0 0.3 - 1.2 mg/dL   GFR calc non Af Amer >60 >60 mL/min   GFR calc Af Amer >60 >60 mL/min   Anion gap 10 5 - 15  Troponin I  Result Value Ref Range   Troponin I <0.03 <0.03 ng/mL   Ct Angio Chest Pe W/cm &/or Wo Cm Result Date: 09/21/2017 CLINICAL DATA:  Chest pain, shortness of breath EXAM: CT ANGIOGRAPHY CHEST WITH CONTRAST TECHNIQUE: Multidetector CT imaging of the chest was performed using the standard protocol during bolus administration of intravenous contrast. Multiplanar CT image reconstructions and MIPs were obtained to evaluate the vascular anatomy. CONTRAST:  100 ml Isovue-370 IV COMPARISON:  None FINDINGS: Cardiovascular: Aorta normal caliber without aneurysm or dissection. No pericardial effusion. Pulmonary arteries well opacified and patent. No evidence of pulmonary embolism. Mediastinum/Nodes: Residual thymic tissue in anterior mediastinum. No thoracic adenopathy. Normal sized axillary nodes bilaterally. Esophagus and visualized base of cervical region unremarkable. Lungs/Pleura: 3 mm RIGHT upper lobe nodule image 47. Lungs otherwise clear. No pulmonary infiltrate, pleural effusion or pneumothorax. Upper Abdomen: Unremarkable Musculoskeletal: No acute osseous findings. Review of the MIP images confirms the above findings. IMPRESSION: No evidence of pulmonary embolism. Nonspecific 3 mm RIGHT upper lobe pulmonary nodule, recommendation below. No follow-up needed if patient is low-risk. Non-contrast chest CT can be considered in 12 months if patient is high-risk. This  recommendation follows the consensus statement: Guidelines for Management of Incidental Pulmonary Nodules Detected on CT Images: From the Fleischner Society 2017; Radiology 2017; 284:228-243. Electronically Signed   By: Ulyses Southward M.D.   On: 09/21/2017 16:19    1730:  HR improved after IVF; HR 95 on my re-exam, Sats 99% R/A. Workup reassuring. Doubt PE as cause for symptoms with negative CT-A chests.  Doubt ACS as cause for symptoms with normal troponin x2 and unchanged EKG from previous after 1 month of atypical symptoms. Dx and testing d/w pt.  Questions answered.  Verb understanding, agreeable to d[facility]c back to jail with outpt f/u.    Final Clinical Impressions(s) / ED Diagnoses   Final diagnoses:  None    New Prescriptions New Prescriptions   No medications on file     Samuel Jester, DO 09/26/17 1507

## 2017-10-02 ENCOUNTER — Ambulatory Visit: Payer: Medicare Other | Admitting: Infectious Diseases

## 2017-10-03 ENCOUNTER — Encounter: Payer: Self-pay | Admitting: Infectious Diseases

## 2017-10-03 ENCOUNTER — Ambulatory Visit
Admission: RE | Admit: 2017-10-03 | Discharge: 2017-10-03 | Disposition: A | Discharge status: Home / Self Care | Payer: Medicare Other | Source: Ambulatory Visit | Attending: Infectious Diseases | Admitting: Infectious Diseases

## 2017-10-03 ENCOUNTER — Ambulatory Visit (INDEPENDENT_AMBULATORY_CARE_PROVIDER_SITE_OTHER): Payer: Medicare Other | Admitting: Infectious Diseases

## 2017-10-03 VITALS — BP 113/69 | HR 85 | Temp 99.4°F | Wt 124.5 lb

## 2017-10-03 DIAGNOSIS — Z8619 Personal history of other infectious and parasitic diseases: Secondary | ICD-10-CM

## 2017-10-03 DIAGNOSIS — R1032 Left lower quadrant pain: Secondary | ICD-10-CM | POA: Diagnosis not present

## 2017-10-03 DIAGNOSIS — F314 Bipolar disorder, current episode depressed, severe, without psychotic features: Secondary | ICD-10-CM

## 2017-10-03 DIAGNOSIS — R000 Tachycardia, unspecified: Secondary | ICD-10-CM | POA: Diagnosis not present

## 2017-10-03 DIAGNOSIS — R634 Abnormal weight loss: Secondary | ICD-10-CM

## 2017-10-03 DIAGNOSIS — E860 Dehydration: Secondary | ICD-10-CM

## 2017-10-03 DIAGNOSIS — B20 Human immunodeficiency virus [HIV] disease: Secondary | ICD-10-CM

## 2017-10-03 DIAGNOSIS — R509 Fever, unspecified: Secondary | ICD-10-CM | POA: Diagnosis not present

## 2017-10-03 DIAGNOSIS — R1030 Lower abdominal pain, unspecified: Secondary | ICD-10-CM

## 2017-10-03 MED ORDER — RISPERIDONE 2 MG PO TABS: 2.0000 mg | ORAL_TABLET | Freq: Every day | ORAL | 2 refills | Status: DC

## 2017-10-03 MED ORDER — BICTEGRAVIR-EMTRICITAB-TENOFOV 50-200-25 MG PO TABS: 1.0000 | ORAL_TABLET | Freq: Every day | ORAL | 5 refills | Status: DC

## 2017-10-03 MED ORDER — ONDANSETRON HCL 4 MG PO TABS: 4.0000 mg | ORAL_TABLET | Freq: Three times a day (TID) | ORAL | 0 refills | Status: DC | PRN

## 2017-10-03 NOTE — Assessment & Plan Note (Signed)
Situational depressed mood d/t incarceration and change in maintenance therapy. With continued daytime fatigue (does not report insomnia even off his trazodone) and lethargy will decrease Risperdal to 2 mg QHS. Continue hydroxyzine at current dose. I am sure if he is in solitary confinement his mental health is suffering due to this. Due to be released early November.

## 2017-10-03 NOTE — Progress Notes (Signed)
Large stool burden observed in colon. Discussed increasing PO water intake and addition of Miralax as needed to achieve soft BMs.

## 2017-10-03 NOTE — Patient Instructions (Signed)
Start BIKTARVY one pill once a day. Can take with or without food. (If this is unavailable at the prison can substitute TIVICAY + DESCOVY)   Please reduce Risperdal to 2 mg QHS as the 3 mg dose is likely contributing to his fatigue/lethargy  Provided 1 L intravenous fluids today during clinic visit. Will need to push him to increase fluid intake.   Please start Miralax for constipation - titrate for 1 soft BM a day.   Will follow up with labs we drew today.   Please have Harrington back to clinic for follow up in 3 weeks.

## 2017-10-03 NOTE — Assessment & Plan Note (Signed)
2 HCV RNA's undetectable now s/p treatment. Resolved.

## 2017-10-03 NOTE — Assessment & Plan Note (Signed)
Temp 99.4 today. With tachycardia and subjective fevers will check BCx to be thorough, although I believe his main acute problem is dehydration.

## 2017-10-03 NOTE — Assessment & Plan Note (Signed)
Out of care and uncontrolled. He feels he will be most successful on STR and small pill. Tolerated Tivicay/Descovy well. Will start him on Biktarvy today. I have counseled on side effects, adherence to regimen and proper way to take medication. Will check VL, CD4, CMET, CBC, Quantiferon today. His CD4 count 3 months ago was very good so I would expect being off medications ~2 months or so it would still be acceptable. No signs of OIs specifically. I believe much of his symptoms are due to poor nutrition and dehydration however does not explain all.   Will have him RTC in 3 weeks.

## 2017-10-03 NOTE — Progress Notes (Signed)
Brief Narrative: Randy Castillo is a AA male with HIV infection. Originally diagnosed with HIV in 01/2015. Started on Genvoya immediately when he was diagnosed. Newly diagnosed Hep C as well with initial RNA VL 24,399, F0. Tx with harvoni and repeat RNAs have been negative. Last regimen consisted of Descovy/Tivicay. History of OIs: none.   PCP - Castillo, Randy A, MD   Patient Active Problem List   Diagnosis Date Noted  . Dehydration 10/03/2017  . Fever and chills, subjective  10/03/2017  . Tachycardia 10/03/2017  . Weight loss 10/03/2017  . Hepatitis C virus infection resolved after antiviral drug therapy 05/03/2016  . Lower abdominal pain 02/24/2016  . HIV disease (HCC) 01/26/2015  . Bipolar disorder (HCC) 12/31/2014  . ADHD (attention deficit hyperactivity disorder) 12/31/2014  . Asthma with acute exacerbation 12/31/2014  . Herpes zoster 12/31/2014  . Generalized anxiety disorder 12/31/2014    Patient's Medications  New Prescriptions   BICTEGRAVIR-EMTRICITABINE-TENOFOVIR AF (BIKTARVY) 50-200-25 MG TABS TABLET    Take 1 tablet by mouth daily. Try to take at the same time each day with or without food.   ONDANSETRON (ZOFRAN) 4 MG TABLET    Take 1 tablet (4 mg total) by mouth every 8 (eight) hours as needed for nausea or vomiting.   RISPERIDONE (RISPERDAL) 2 MG TABLET    Take 1 tablet (2 mg total) by mouth at bedtime.  Previous Medications   ALBUTEROL (PROVENTIL HFA;VENTOLIN HFA) 108 (90 BASE) MCG/ACT INHALER    Inhale 2 puffs into the lungs every 6 (six) hours as needed for wheezing.   ALPRAZOLAM (XANAX) 2 MG TABLET    Take 1 mg by mouth 3 (three) times daily as needed for sleep or anxiety.    AMPHETAMINE-DEXTROAMPHETAMINE (ADDERALL) 20 MG TABLET    Take 20 mg by mouth 4 (four) times daily.   BUPRENORPHINE (SUBUTEX) 8 MG SUBL SL TABLET    Place under the tongue.   COMBIVENT RESPIMAT 20-100 MCG/ACT AERS RESPIMAT    Inhale 1 puff into the lungs every 6 (six) hours as needed.     FLUTICASONE (FLONASE) 50 MCG/ACT NASAL SPRAY    USE 2 SPRAYS IEN D   LATUDA 80 MG TABS TABLET    TK 1 T PO  QD   MEGESTROL (MEGACE) 40 MG/ML SUSPENSION    SHAKE LIQUID AND TAKE 10 ML(400 MG) BY MOUTH DAILY   MONTELUKAST (SINGULAIR) 10 MG TABLET    TK 1 T PO QHS   NICOTINE STEP 1 21 MG/24HR PATCH    Apply 1 patch topically daily.   ONDANSETRON (ZOFRAN) 4 MG TABLET    Take 1 tablet (4 mg total) by mouth daily.   PROMETHAZINE (PHENERGAN) 25 MG TABLET    Take 1 tablet (25 mg total) by mouth every 8 (eight) hours as needed for nausea or vomiting.   RISPERIDONE (RISPERDAL PO)    Take by mouth.   TRAZODONE (DESYREL) 50 MG TABLET    Take 50 mg by mouth at bedtime. 1-2 tablets by mouth every night at bedtime   VALACYCLOVIR (VALTREX) 1000 MG TABLET    Take 1,000 mg by mouth daily. *MAY TAKE ONE TABLET IN ADDITION AS NEEDED  Modified Medications   No medications on file  Discontinued Medications   DESCOVY 200-25 MG TABLET    TAKE 1 TABLET BY MOUTH DAILY   DOLUTEGRAVIR (TIVICAY) 50 MG TABLET    TAKE 1 TABLET(50 MG) BY MOUTH DAILY   EMTRICITABINE-TENOFOVIR AF (DESCOVY)  200-25 MG TABLET    Take 1 tablet by mouth daily.    Subjective: HPI:  HIV =  Patient presents to get back in care today. Previously seen by Dr. Ninetta Castillo 1 year ago. Has been incarcerated off and on (currently in [facility] now and here today with sheriff). Stopped taking tivicay/descovy consistently in August when he was first incarcerated for 3 weeks. Released for 2-3 days and took ART then. Now has been back in jail 4-5 weeks without medications again. [name] feels very poor today. Symptoms include abdominal pain, hair loss, nausea, fatigued, tired, dizzy and altered balance with falls in jail. He has had notable documented weight loss of 20 lbs since intake. Headaches started a few days ago in occipital/upper neck region but resolve on their own. Describes fuzzy/tunnel vision on occasion. Difficulty walking due to general  fatigue of body, not necessarily shortness of breath. No diarrhea. Reports constipation - one small hard BM daily, no BRBPR. CD4 count was checked 3 months ago @ 990. No viral load was done at that time however prior to that he had been highly adherent with undetectable viral load shortly after started ART.   ER visit 09/21/17 for SOB and tachycardia. CT chest w/o concern for PE. 3mm. Right upper lobe nodule seen (FU non-contrast CT in 12 months recommended if at risk). EKG normal aside from tachycardia and unchanged from prior study. Troponins negative x 2. His heart rate improved to ~95 after IVF and he was discharged back to jail.   **Spoke with [employer] Randy Castillo) and she is very concerned at how he is doing. Reports he has been more lethargic and fatigued lately. Very concerned over weight loss and syncopal episodes. Verifies he eats full meal trays but losing weight (145 lbs at intake and now 125 lbs). Having syncopal/pre-syncopal episodes per staff account. She tells me that they attempted to get the names/doses of HIV regimen but unable to get in touch with family to have them bring meds.   Bipolar Disorder = previously on Latuda but this has not been continued in jail as they do not give this medication; substituted Risperdal 3 mg with Hydroxyzine 50 mg BID (which the latter has been held x 2 days since he has had borderline blood pressures). Has also been off xanax and off trazodone. Has been in "solitary confinement/protection" from what he reports. He is sleeping all the time. Mostly down and depressed currently given how poorly he feels and situation with incarceration.   Health Maintenance = He has been unable to exercise. Eating all meals that he is provided in jail. He has only had 2 small glasses of Koolaid today for intake. Has not had flu vaccination yet.   Review of Systems: Review of Systems  Constitutional: Positive for chills, fever (subjective), malaise/fatigue and weight loss.  HENT:  Negative for tinnitus.        Hair loss   Eyes: Positive for blurred vision.  Respiratory: Negative for cough, sputum production and shortness of breath.   Cardiovascular: Negative for chest pain, orthopnea and leg swelling.  Gastrointestinal: Positive for abdominal pain, constipation, nausea and vomiting. Negative for diarrhea.  Skin: Negative for rash.  Neurological: Positive for weakness.    Past Medical History:  Diagnosis Date  . Anxiety   . Asthma   . Bipolar affective disorder, manic, in partial or remission (HCC)   . Environmental allergies   . Gonorrhea in male   . Hepatitis C virus infection resolved  after antiviral drug therapy 05/03/2016  . Herpes   . HIV disease (HCC)   . Hypertension   . Migraine   . Neuropathic pain   . Opioid dependence on agonist therapy (HCC)   . PTSD (post-traumatic stress disorder)   . Seizures (HCC)     Social History  Substance Use Topics  . Smoking status: Current Every Day Smoker    Packs/day: 1.00    Years: 16.00    Types: Cigarettes    Start date: 12/11/1994  . Smokeless tobacco: Never Used     Comment: Is vaping to help reduce cigarette use  . Alcohol use No    Allergies  Allergen Reactions  . Zanaflex [Tizanidine Hcl]     Seizure!  . Diclofenac Sodium Hives    Interaction with HIV medications   . Naloxone Other (See Comments)    headache  . Propoxyphene Nausea And Vomiting  . Darvocet [Propoxyphene N-Acetaminophen] Nausea And Vomiting    Objective: Vitals:   10/03/17 1440 10/03/17 1702  BP: (!) 132/95 113/69  Pulse: (!) 134 85  Temp: 99.4 F (37.4 C)   TempSrc: Oral   Weight: 124 lb 8 oz (56.5 kg)    Body mass index is 17.86 kg/m.  Physical Exam  Constitutional: He is oriented to person, place, and time.  Thin caucasian male. He is iIll appearing, tearful, scared.   HENT:  Mouth/Throat: Mucous membranes are not pale and dry. Dental caries present. No oropharyngeal exudate.  Eyes: Pupils are equal, round,  and reactive to light. No scleral icterus.  Sunken appearance  Neck: Normal range of motion and full passive range of motion without pain.  Cardiovascular: Regular rhythm, normal heart sounds and normal pulses.  Tachycardia present.  Exam reveals no gallop.   Pulmonary/Chest: Effort normal and breath sounds normal. No respiratory distress. He has no wheezes. He has no rales.  Abdominal: Soft. Bowel sounds are normal. He exhibits mass (LLQ). He exhibits no distension. There is tenderness.  Musculoskeletal: Normal range of motion.  Neurological: He is alert and oriented to person, place, and time.  Skin: Skin is warm and dry. There is pallor.  Psychiatric: Affect and judgment normal. His mood appears anxious. He does not exhibit a depressed mood.    Lab Results Lab Results  Component Value Date   WBC 6.2 10/03/2017   HGB 16.0 10/03/2017   HCT 45.6 10/03/2017   MCV 91.4 10/03/2017   PLT 234 10/03/2017    Lab Results  Component Value Date   CREATININE 0.81 09/21/2017   BUN 22 (H) 09/21/2017   NA 135 09/21/2017   K 4.0 09/21/2017   CL 101 09/21/2017   CO2 24 09/21/2017    Lab Results  Component Value Date   ALT 21 09/21/2017   AST 38 09/21/2017   ALKPHOS 49 09/21/2017   BILITOT 1.0 09/21/2017    Lab Results  Component Value Date   CHOL 123 03/07/2017   HDL 42 03/07/2017   LDLCALC 66 03/07/2017   TRIG 76 03/07/2017   CHOLHDL 2.9 03/07/2017   HIV 1 RNA Quant (copies/mL)  Date Value  06/14/2017 <20 NOT DETECTED  03/07/2017 <20 NOT DETECTED  09/12/2016 <20   CD4 T Cell Abs (/uL)  Date Value  06/14/2017 990  03/07/2017 630  09/12/2016 700   Lab Results  Component Value Date   HAV REACTIVE (A) 05/03/2016   Lab Results  Component Value Date   HEPBSAG NEGATIVE 03/01/2016   HEPBSAB INDETER (A) 03/01/2016  Lab Results  Component Value Date   HCVAB REACTIVE (A) 03/01/2016   Lab Results  Component Value Date   CHLAMYDIAWP Negative 05/25/2016   N Negative  05/25/2016   No results found for: GCPROBEAPT Lab Results  Component Value Date   QUANTGOLD NEGATIVE 12/31/2014   No results found for: RPR    Problem List Items Addressed This Visit      Other   Bipolar disorder (HCC)    Situational depressed mood d/t incarceration and change in maintenance therapy. With continued daytime fatigue (does not report insomnia even off his trazodone) and lethargy will decrease Risperdal to 2 mg QHS. Continue hydroxyzine at current dose. I am sure if he is in solitary confinement his mental health is suffering due to this. Due to be released early November.      Relevant Medications   risperiDONE (RISPERDAL) 2 MG tablet   Dehydration    Moderate-severe. He is orthostatic and tachycardic on exam. Very poor PO fluid intake history. Will have RN place IV and administer 1 L NS over next hour.       Fever and chills, subjective     Temp 99.4 today. With tachycardia and subjective fevers will check BCx to be thorough, although I believe his main acute problem is dehydration.       Hepatitis C virus infection resolved after antiviral drug therapy    2 HCV RNA's undetectable now s/p treatment. Resolved.       HIV disease (HCC) (Chronic)    Out of care and uncontrolled. He feels he will be most successful on STR and small pill. Tolerated Tivicay/Descovy well. Will start him on Biktarvy today. I have counseled on side effects, adherence to regimen and proper way to take medication. Will check VL, CD4, CMET, CBC, Quantiferon today. His CD4 count 3 months ago was very good so I would expect being off medications ~2 months or so it would still be acceptable. No signs of OIs specifically. I believe much of his symptoms are due to poor nutrition and dehydration however does not explain all.   Will have him RTC in 3 weeks.       Relevant Medications   bictegravir-emtricitabine-tenofovir AF (BIKTARVY) 50-200-25 MG TABS tablet   Lower abdominal pain    I would  presume he has constipation considering exam and poor fluid intake. Will check abd xray to ensure mass I feel is only stool burden as he is quite tender in LLQ. Counseled on increasing fluid intake. Miralax as needed to achieve soft BMs.       Tachycardia   Relevant Orders   COMPLETE METABOLIC PANEL WITH GFR   CBC with Differential/Platelet (Completed)   TSH   T4   Magnesium   DG Abd 2 Views (Completed)   Weight loss    Significant weight loss in last month. Would be unlikely this is d/t OI with recent CD4 of > 900 3 months ago. Restart ART today. Will check thyroid function and assess albumin/protein on labs with complaints of associated hair loss. Previously struggled with anorexia/weight loss. Will follow up at next visit. Advised to continue the one Boost / day he can get at facility. He is not currently taking his megace. Not clear how well or how often he is fed in detention center.        Other Visit Diagnoses    HIV (human immunodeficiency virus infection) (HCC)    -  Primary   Relevant Medications   bictegravir-emtricitabine-tenofovir  AF (BIKTARVY) 50-200-25 MG TABS tablet   ondansetron (ZOFRAN) 4 MG tablet   Other Relevant Orders   HIV-1 RNA ultraquant reflex to gentyp+   T-helper cell (CD4)- ([facility] clinic only)   RPR   DG Abd Portable 1V   QuantiFERON-TB Gold Plus   DG Abd 2 Views (Completed)   Fever, unspecified fever cause       Relevant Orders   Culture, blood (single)   Culture, blood (single)   DG Abd 2 Views (Completed)     17:15 - tachycardia improved significantly with R/R/R in 80s. Jermey feels much better after fluid bolus. No longer orthostatic with position changes. PIV D/C'd. Pressure held until hemostasis obtained. Dressing applied. No longer orthostatic after getting up off exam table.   I spent 40 minutes with the patient with greater than 50% of the time spent face to face in discussion about his HIV infection, explanation of lab work, counseling for  adherence and safe sex practices, monitoring of patient during IV infusion therapy and in coordination of his care including discussion with jail healthcare team.   Rexene Alberts, MSN, NP-C [facility] Pager: [phone]  10/03/17 17:20 PM

## 2017-10-03 NOTE — Assessment & Plan Note (Addendum)
Moderate-severe. He is orthostatic and tachycardic on exam. Very poor PO fluid intake history. Will have RN place IV and administer 1 L NS over next hour.

## 2017-10-03 NOTE — Assessment & Plan Note (Signed)
Significant weight loss in last month. Would be unlikely this is d/t OI with recent CD4 of > 900 3 months ago. Restart ART today. Will check thyroid function and assess albumin/protein on labs with complaints of associated hair loss. Previously struggled with anorexia/weight loss. Will follow up at next visit. Advised to continue the one Boost / day he can get at facility. He is not currently taking his megace. Not clear how well or how often he is fed in detention center.

## 2017-10-03 NOTE — Progress Notes (Signed)
Per verbal order from Rexene AlbertsStephanie Dixon, NP, for 1L NS bolus via peripheral IV.  Labs drawn by Lab. RN placed 22 g PIV right antecubital. Patient comfortable while IV bolus infuses. No questions or concerns at this time. Andree CossHowell, Tammey Deeg M, RN

## 2017-10-03 NOTE — Assessment & Plan Note (Signed)
I would presume he has constipation considering exam and poor fluid intake. Will check abd xray to ensure mass I feel is only stool burden as he is quite tender in LLQ. Counseled on increasing fluid intake. Miralax as needed to achieve soft BMs.

## 2017-10-05 LAB — T4: T4, Total: 5.9 ug/dL (ref 4.9–10.5)

## 2017-10-05 LAB — CBC WITH DIFFERENTIAL/PLATELET
BASOS ABS: 31 {cells}/uL (ref 0–200)
BASOS PCT: 0.5 %
EOS ABS: 62 {cells}/uL (ref 15–500)
Eosinophils Relative: 1 %
HCT: 45.6 % (ref 38.5–50.0)
Hemoglobin: 16 g/dL (ref 13.2–17.1)
Lymphs Abs: 2728 cells/uL (ref 850–3900)
MCH: 32.1 pg (ref 27.0–33.0)
MCHC: 35.1 g/dL (ref 32.0–36.0)
MCV: 91.4 fL (ref 80.0–100.0)
MONOS PCT: 6.8 %
MPV: 11.1 fL (ref 7.5–12.5)
Neutro Abs: 2957 cells/uL (ref 1500–7800)
Neutrophils Relative %: 47.7 %
PLATELETS: 234 10*3/uL (ref 140–400)
RBC: 4.99 10*6/uL (ref 4.20–5.80)
RDW: 11.9 % (ref 11.0–15.0)
TOTAL LYMPHOCYTE: 44 %
WBC mixed population: 422 cells/uL (ref 200–950)
WBC: 6.2 10*3/uL (ref 3.8–10.8)

## 2017-10-05 LAB — COMPLETE METABOLIC PANEL WITH GFR
AG Ratio: 1.7 (calc) (ref 1.0–2.5)
ALKALINE PHOSPHATASE (APISO): 54 U/L (ref 40–115)
ALT: 10 U/L (ref 9–46)
AST: 13 U/L (ref 10–40)
Albumin: 4.5 g/dL (ref 3.6–5.1)
BILIRUBIN TOTAL: 0.5 mg/dL (ref 0.2–1.2)
BUN: 18 mg/dL (ref 7–25)
CHLORIDE: 102 mmol/L (ref 98–110)
CO2: 30 mmol/L (ref 20–32)
Calcium: 9.6 mg/dL (ref 8.6–10.3)
Creat: 0.87 mg/dL (ref 0.60–1.35)
GFR, Est African American: 129 mL/min/{1.73_m2} (ref >=60)
GFR, Est Non African American: 111 mL/min/{1.73_m2} (ref >=60)
GLUCOSE: 75 mg/dL (ref 65–99)
Globulin: 2.6 g/dL (calc) (ref 1.9–3.7)
Potassium: 4.8 mmol/L (ref 3.5–5.3)
Sodium: 139 mmol/L (ref 135–146)
Total Protein: 7.1 g/dL (ref 6.1–8.1)

## 2017-10-05 LAB — QUANTIFERON TB GOLD ASSAY (BLOOD)
Mitogen-Nil: >10 IU/mL
QUANTIFERON NIL VALUE: 0.09 [IU]/mL
QUANTIFERON(R)-TB GOLD: NEGATIVE
Quantiferon Tb Ag Minus Nil Value: 0.02 IU/mL

## 2017-10-05 LAB — FLUORESCENT TREPONEMAL AB(FTA)-IGG-BLD: Fluorescent Treponemal ABS: REACTIVE — AB

## 2017-10-05 LAB — RPR: RPR: REACTIVE — AB

## 2017-10-05 LAB — MAGNESIUM: Magnesium: 2.2 mg/dL (ref 1.5–2.5)

## 2017-10-05 LAB — RPR TITER: RPR Titer: 1:2 {titer} — ABNORMAL HIGH

## 2017-10-05 LAB — T-HELPER CELL (CD4) - (RCID CLINIC ONLY)
CD4 T CELL ABS: 950 /uL (ref 400–2700)
CD4 T CELL HELPER: 34 % (ref 33–55)

## 2017-10-05 LAB — TSH: TSH: 0.81 m[IU]/L (ref 0.40–4.50)

## 2017-10-09 LAB — CULTURE, BLOOD (SINGLE)
MICRO NUMBER: 81190917
MICRO NUMBER:: 81190915
RESULT: NO GROWTH
Result:: NO GROWTH
SPECIMEN QUALITY: ADEQUATE
SPECIMEN QUALITY:: ADEQUATE

## 2017-10-13 LAB — HIV-1 RNA ULTRAQUANT REFLEX TO GENTYP+
HIV 1 RNA Quant: 2790 Copies/mL — ABNORMAL HIGH
HIV-1 RNA QUANT, LOG: 3.45 {Log_copies}/mL — AB

## 2017-10-13 LAB — RFLX HIV-1 INTEGRASE GENOTYPE: HIV-1 Genotype: DETECTED — AB

## 2017-10-25 ENCOUNTER — Telehealth: Payer: Self-pay

## 2017-10-25 NOTE — Telephone Encounter (Signed)
Called pt to remind him of his appt on 10/26/17 with Rexene AlbertsStephanie Dixon, NP. During the call the pt stated he had a some burning sensation while urinating, denied any discharge. I told the pt that I would inform Judeth CornfieldStephanie of his symptom.  Judeth CornfieldStephanie is aware. Randy CourierJose L Cisco Castillo, [place]

## 2017-10-26 ENCOUNTER — Ambulatory Visit: Payer: Medicare Other | Admitting: Infectious Diseases

## 2017-10-29 ENCOUNTER — Ambulatory Visit (INDEPENDENT_AMBULATORY_CARE_PROVIDER_SITE_OTHER): Payer: Medicare Other | Admitting: Infectious Diseases

## 2017-10-29 ENCOUNTER — Encounter: Payer: Self-pay | Admitting: Infectious Diseases

## 2017-10-29 VITALS — BP 125/87 | HR 109 | Temp 98.5°F | Wt 140.0 lb

## 2017-10-29 DIAGNOSIS — B20 Human immunodeficiency virus [HIV] disease: Secondary | ICD-10-CM

## 2017-10-29 DIAGNOSIS — R413 Other amnesia: Secondary | ICD-10-CM

## 2017-10-29 DIAGNOSIS — Z5181 Encounter for therapeutic drug level monitoring: Secondary | ICD-10-CM | POA: Diagnosis not present

## 2017-10-29 DIAGNOSIS — F3113 Bipolar disorder, current episode manic without psychotic features, severe: Secondary | ICD-10-CM | POA: Diagnosis not present

## 2017-10-29 DIAGNOSIS — R300 Dysuria: Secondary | ICD-10-CM

## 2017-10-29 DIAGNOSIS — E559 Vitamin D deficiency, unspecified: Secondary | ICD-10-CM

## 2017-10-29 DIAGNOSIS — Z8639 Personal history of other endocrine, nutritional and metabolic disease: Secondary | ICD-10-CM | POA: Diagnosis not present

## 2017-10-29 MED ORDER — ONDANSETRON HCL 4 MG PO TABS: 4.0000 mg | ORAL_TABLET | Freq: Three times a day (TID) | ORAL | 1 refills | Status: DC | PRN

## 2017-10-29 NOTE — Assessment & Plan Note (Signed)
Improved on increased latuda and addition of seroquel. Managed per Psych. Will check Vit D (history of deficiency), B12/Folate (memory issues). TSH done previously and normal.

## 2017-10-29 NOTE — Patient Instructions (Signed)
Continue taking your Biktarvy   Back to see us in 2 months with repeat viral load 1-2 weeks before please.

## 2017-10-29 NOTE — Assessment & Plan Note (Addendum)
Reviewed labs with him. Continue Biktarvy. Counseled on adherence. Will check VL today to ensure appropriate drop. Will have him return in 2 months to check in. CD4 excellent and no need for OI prophylaxis.

## 2017-10-29 NOTE — Assessment & Plan Note (Signed)
Check UA with reflex to culture today. No sexual contact and adamant he is without risk for STI. Check urine GC/C to be sure. Advised to increase water intake and reduce tea/coffee - which he is not likely to do. [facility] get [facility] water in that way.

## 2017-10-29 NOTE — Progress Notes (Signed)
Randy Castillo 11/11/1981 [id number]  PCP: Vivien Prestoorrington, Kip A, MD  Psychiatrist: Dr. Evelene CroonKaur [phone](F#[phone])  Patient ID: Randy Castillo is a caucasian male with HIV infection. Originally diagnosed with HIV in 01/2015. Started on Genvoya immediately when he was diagnosed. Newly diagnosed Hep C as well with initial RNA VL 24,399, F0. Tx with harvoni -->cured. Last regimen consisted of Descovy/Tivica and now on Biktarvy. HIV Risk: MSM; History of OIs: none.   Genosure: 09/2017 pan-sensitive    HPI:  HIV =  Released from jail the day after I saw him last. Feeling much improved, regained 13 lbs, eating much better esp after seroquel added to his bipolar regimen. Has not missed a dose of Biktarvy since he has been home and likes the new regimen. No side effects or concerns with medication. Tells me he is living with his husband again (of which he previously had a restraining order against but has since been dropped). Endorses no complaints today suggestive of associated opportunistic infection or advancing HIV disease such as fevers, night sweats, weight loss, anorexia, cough, SOB, nausea, vomiting, diarrhea, headache, sensory changes, lymphadenopathy or oral thrush. Not sexually active for many years per his choice. "couldn't live with himself if he infected someone else". Asking for refill on nausea medication as he frequently has issue with this during the day.   Bipolar Disorder =resumed latuda and psychiatrist added seroquel. He tells me he has felt the best he has in years and is very happy with the status of his bipolar disorder. [facility]. Sleep initially improved however he feels that his seroquel needs to be titrated upward to help with this.   Dysuria = This is a new problem that started about a week ago. Having some pain when he urinates at times when he finishes urinating. Urine described to be dark yellow. He does not drink water and only fluids  include Koolaid, coffee, sweet tea, hot tee and Monster energy drinks. Some back/flank pain. No fevers, chills.   Health Maintenance = He has been unable to exercise. Eating all meals that he is provided in jail. He has only had 2 small glasses of Koolaid today for intake. Has not had flu vaccination yet.   Review of Systems: Review of Systems  Constitutional: Negative for chills, fever, malaise/fatigue and weight loss.  HENT: Negative for sore throat.        No dental problems  Respiratory: Negative for cough and sputum production.   Cardiovascular: Negative for chest pain and leg swelling.  Gastrointestinal: Negative for abdominal pain, diarrhea and vomiting.  Genitourinary: Positive for dysuria. Negative for flank pain, frequency and urgency.  Musculoskeletal: Negative for joint pain, myalgias and neck pain.  Skin: Negative for rash.  Neurological: Negative for dizziness, tingling and headaches.  Psychiatric/Behavioral: Negative for depression and substance abuse. The patient has insomnia. The patient is not nervous/anxious.     Past Medical History:  Diagnosis Date  . Anxiety   . Asthma   . Bipolar affective disorder, manic, in partial or remission (HCC)   . Environmental allergies   . Gonorrhea in male   . Hepatitis C virus infection resolved after antiviral drug therapy 05/03/2016  . Herpes   . HIV disease (HCC)   . Hypertension   . Migraine   . Neuropathic pain   . Opioid dependence on agonist therapy (HCC)   . PTSD (post-traumatic stress disorder)   . Seizures (HCC)  Social History   Tobacco Use  . Smoking status: Current Every Day Smoker    Packs/day: 1.00    Years: 16.00    Pack years: 16.00    Types: Cigarettes    Start date: 12/11/1994  . Smokeless tobacco: Never Used  . Tobacco comment: Is vaping to help reduce cigarette use  Substance Use Topics  . Alcohol use: No    Alcohol/week: 0.0 oz  . Drug use: Yes    Types: Marijuana    Comment: drugs in  mid-20s. intranasal/iv    Allergies  Allergen Reactions  . Zanaflex [Tizanidine Hcl]     Seizure!  . Diclofenac Sodium Hives    Interaction with HIV medications   . Naloxone Other (See Comments)    headache  . Propoxyphene Nausea And Vomiting  . Darvocet [Propoxyphene N-Acetaminophen] Nausea And Vomiting    Objective: Vitals:   10/29/17 1547  BP: 125/87  Pulse: (!) 109  Temp: 98.5 F (36.9 C)  Weight: 140 lb (63.5 kg)   Body mass index is 20.09 kg/m.  Physical Exam  Constitutional: He is oriented to person, place, and time and well-developed, well-nourished, and in no distress.  Appears to have regained weight and is well appearing today.   HENT:  Mouth/Throat: No oral lesions. Normal dentition. No dental caries.  Eyes: No scleral icterus.  Cardiovascular: Normal rate, regular rhythm and normal heart sounds.  Pulmonary/Chest: Effort normal and breath sounds normal.  Abdominal: Soft. He exhibits no distension. There is no tenderness.  Lymphadenopathy:    He has no cervical adenopathy.  Neurological: He is alert and oriented to person, place, and time.  Skin: Skin is warm and dry. No rash noted.  Psychiatric: Affect and judgment normal. He exhibits ordered thought content.  Hyper mood / manic today    Lab Results Lab Results  Component Value Date   WBC 6.2 10/03/2017   HGB 16.0 10/03/2017   HCT 45.6 10/03/2017   MCV 91.4 10/03/2017   PLT 234 10/03/2017    Lab Results  Component Value Date   CREATININE 0.87 10/03/2017   BUN 18 10/03/2017   NA 139 10/03/2017   K 4.8 10/03/2017   CL 102 10/03/2017   CO2 30 10/03/2017    Lab Results  Component Value Date   ALT 10 10/03/2017   AST 13 10/03/2017   ALKPHOS 49 09/21/2017   BILITOT 0.5 10/03/2017    Lab Results  Component Value Date   CHOL 123 03/07/2017   HDL 42 03/07/2017   LDLCALC 66 03/07/2017   TRIG 76 03/07/2017   CHOLHDL 2.9 03/07/2017   HIV 1 RNA Quant  Date Value  10/03/2017 2,790 Copies/mL  (H)  06/14/2017 <20 NOT DETECTED copies/mL  03/07/2017 <20 NOT DETECTED copies/mL   CD4 T Cell Abs (/uL)  Date Value  10/03/2017 950  06/14/2017 990  03/07/2017 630   Lab Results  Component Value Date   HAV REACTIVE (A) 05/03/2016   Lab Results  Component Value Date   HEPBSAG NEGATIVE 03/01/2016   HEPBSAB INDETER (A) 03/01/2016   Lab Results  Component Value Date   HCVAB REACTIVE (A) 03/01/2016   Lab Results  Component Value Date   CHLAMYDIAWP Negative 05/25/2016   N Negative 05/25/2016   No results found for: GCPROBEAPT Lab Results  Component Value Date   QUANTGOLD NEGATIVE 12/31/2014   No results found for: RPR    Problem List Items Addressed This Visit      Other  HIV disease (HCC) (Chronic)    Reviewed labs with him. Continue Biktarvy. Counseled on adherence. Will check VL today to ensure appropriate drop. Will have him return in 2 months to check in. CD4 excellent and no need for OI prophylaxis.       Bipolar disorder (HCC)    Improved on increased latuda and addition of seroquel. Managed per Psych. Will check Vit D (history of deficiency), B12/Folate (memory issues). TSH done previously and normal.       Dysuria    Check UA with reflex to culture today. No sexual contact and adamant he is without risk for STI. Check urine GC/C to be sure. Advised to increase water intake and reduce tea/coffee - which he is not likely to do. Asked him to add more water to [facility] to see if he can get more water in that way.       Relevant Orders   Urinalysis   Urine Culture    Other Visit Diagnoses    HIV (human immunodeficiency virus infection) (HCC)    -  Primary   Relevant Medications   ondansetron (ZOFRAN) 4 MG tablet   Other Relevant Orders   HIV 1 RNA quant-no reflex-bld   HIV 1 RNA quant-no reflex-bld   Medication monitoring encounter       Relevant Orders   B12   Folate   Memory disorder       Relevant Orders   B12   Folate   H/O vitamin D deficiency        Vitamin D deficiency       Relevant Orders   Vitamin D 1,25 dihydroxy     Meds ordered this encounter  Medications  . ondansetron (ZOFRAN) 4 MG tablet    Sig: Take 1 tablet (4 mg total) every 8 (eight) hours as needed by mouth for nausea or vomiting.    Dispense:  30 tablet    Refill:  1    [address] [place], [place] [address] Please deliver to detention center [phone]    Order Specific Question:   Supervising Provider    Answer:   Ninetta Lights, JEFFREY C [[id number]]    Rexene Alberts, MSN, NP-C [facility] Pager: [phone]  10/29/17 16:50 PM

## 2017-10-30 ENCOUNTER — Encounter: Payer: Self-pay | Admitting: Infectious Diseases

## 2017-10-30 LAB — URINALYSIS
BILIRUBIN URINE: NEGATIVE
Glucose, UA: NEGATIVE
Hgb urine dipstick: NEGATIVE
KETONES UR: NEGATIVE
Leukocytes, UA: NEGATIVE
NITRITE: NEGATIVE
PROTEIN: NEGATIVE
SPECIFIC GRAVITY, URINE: 1.015 (ref 1.001–1.03)
pH: 8 (ref 5.0–8.0)

## 2017-10-30 LAB — URINE CULTURE
MICRO NUMBER: 81302912
RESULT: NO GROWTH
SPECIMEN QUALITY: ADEQUATE

## 2017-10-31 LAB — HIV-1 RNA QUANT-NO REFLEX-BLD
HIV 1 RNA Quant: <20 copies/mL
HIV-1 RNA QUANT, LOG: NOT DETECTED {Log_copies}/mL

## 2017-11-01 LAB — VITAMIN D 1,25 DIHYDROXY
VITAMIN D 1, 25 (OH) TOTAL: 38 pg/mL (ref 18–72)
VITAMIN D3 1, 25 (OH): 38 pg/mL

## 2017-11-01 LAB — FOLATE: FOLATE: 8.4 ng/mL

## 2017-11-01 LAB — VITAMIN B12: Vitamin B-12: 546 pg/mL (ref 200–1100)

## 2017-11-27 ENCOUNTER — Encounter: Payer: Self-pay | Admitting: Infectious Diseases

## 2017-12-19 ENCOUNTER — Ambulatory Visit: Payer: Medicare Other | Admitting: Infectious Diseases

## 2018-01-18 ENCOUNTER — Other Ambulatory Visit: Payer: Self-pay | Admitting: Infectious Diseases

## 2018-01-18 DIAGNOSIS — F314 Bipolar disorder, current episode depressed, severe, without psychotic features: Secondary | ICD-10-CM

## 2018-02-07 ENCOUNTER — Other Ambulatory Visit: Payer: Self-pay | Admitting: Infectious Diseases

## 2018-02-07 ENCOUNTER — Encounter: Payer: Self-pay | Admitting: Infectious Diseases

## 2018-02-07 DIAGNOSIS — Z21 Asymptomatic human immunodeficiency virus [HIV] infection status: Secondary | ICD-10-CM

## 2018-02-07 DIAGNOSIS — B20 Human immunodeficiency virus [HIV] disease: Secondary | ICD-10-CM

## 2018-03-21 ENCOUNTER — Ambulatory Visit: Payer: Medicare Other | Admitting: Infectious Diseases

## 2018-04-16 ENCOUNTER — Ambulatory Visit: Payer: Medicare Other | Admitting: Infectious Diseases

## 2018-05-05 ENCOUNTER — Encounter: Payer: Self-pay | Admitting: Infectious Diseases

## 2018-05-10 ENCOUNTER — Emergency Department (HOSPITAL_COMMUNITY)
Admission: EM | Admit: 2018-05-10 | Discharge: 2018-05-10 | Disposition: A | Discharge status: Home / Self Care | Payer: Medicare Other | Attending: Emergency Medicine | Admitting: Emergency Medicine

## 2018-05-10 ENCOUNTER — Other Ambulatory Visit: Payer: Self-pay

## 2018-05-10 ENCOUNTER — Encounter (HOSPITAL_COMMUNITY): Payer: Self-pay | Admitting: *Deleted

## 2018-05-10 DIAGNOSIS — Z760 Encounter for issue of repeat prescription: Secondary | ICD-10-CM | POA: Diagnosis present

## 2018-05-10 DIAGNOSIS — J45909 Unspecified asthma, uncomplicated: Secondary | ICD-10-CM | POA: Diagnosis not present

## 2018-05-10 DIAGNOSIS — F17210 Nicotine dependence, cigarettes, uncomplicated: Secondary | ICD-10-CM | POA: Insufficient documentation

## 2018-05-10 DIAGNOSIS — B20 Human immunodeficiency virus [HIV] disease: Secondary | ICD-10-CM

## 2018-05-10 DIAGNOSIS — J4531 Mild persistent asthma with (acute) exacerbation: Secondary | ICD-10-CM

## 2018-05-10 DIAGNOSIS — Z79899 Other long term (current) drug therapy: Secondary | ICD-10-CM | POA: Diagnosis not present

## 2018-05-10 DIAGNOSIS — J189 Pneumonia, unspecified organism: Secondary | ICD-10-CM

## 2018-05-10 DIAGNOSIS — Z21 Asymptomatic human immunodeficiency virus [HIV] infection status: Secondary | ICD-10-CM | POA: Diagnosis not present

## 2018-05-10 DIAGNOSIS — I10 Essential (primary) hypertension: Secondary | ICD-10-CM | POA: Insufficient documentation

## 2018-05-10 DIAGNOSIS — F319 Bipolar disorder, unspecified: Secondary | ICD-10-CM

## 2018-05-10 MED ORDER — VALACYCLOVIR HCL 1 G PO TABS: 1000.0000 mg | ORAL_TABLET | Freq: Every day | ORAL | 0 refills | Status: DC

## 2018-05-10 MED ORDER — BICTEGRAVIR-EMTRICITAB-TENOFOV 50-200-25 MG PO TABS: 1.0000 | ORAL_TABLET | Freq: Every day | ORAL | 0 refills | Status: DC

## 2018-05-10 MED ORDER — ALPRAZOLAM 0.5 MG PO TABS
1.0000 mg | ORAL_TABLET | Freq: Once | ORAL | Status: AC
  Administered 2018-05-10: 2 mg via ORAL
  Filled 2018-05-10: qty 4

## 2018-05-10 MED ORDER — LATUDA 120 MG PO TABS: ORAL_TABLET | ORAL | 0 refills | Status: DC

## 2018-05-10 MED ORDER — ALBUTEROL SULFATE HFA 108 (90 BASE) MCG/ACT IN AERS: 2.0000 | INHALATION_SPRAY | Freq: Four times a day (QID) | RESPIRATORY_TRACT | 3 refills | Status: DC | PRN

## 2018-05-10 MED ORDER — FLUTICASONE PROPIONATE 50 MCG/ACT NA SUSP: NASAL | 0 refills | Status: DC

## 2018-05-10 MED ORDER — NICOTINE STEP 1 21 MG/24HR TD PT24: 21.0000 mg | MEDICATED_PATCH | Freq: Every day | TRANSDERMAL | 0 refills | Status: DC

## 2018-05-10 MED ORDER — AMPHETAMINE-DEXTROAMPHETAMINE 20 MG PO TABS: 20.0000 mg | ORAL_TABLET | Freq: Four times a day (QID) | ORAL | 0 refills | Status: AC

## 2018-05-10 MED ORDER — BICTEGRAVIR-EMTRICITAB-TENOFOV 50-200-25 MG PO TABS
1.0000 | ORAL_TABLET | Freq: Every day | ORAL | Status: DC
  Administered 2018-05-10: 1 via ORAL
  Filled 2018-05-10 (×4): qty 1

## 2018-05-10 MED ORDER — ONDANSETRON HCL 4 MG PO TABS: 4.0000 mg | ORAL_TABLET | Freq: Three times a day (TID) | ORAL | 1 refills | Status: DC | PRN

## 2018-05-10 MED ORDER — LURASIDONE HCL 40 MG PO TABS
120.0000 mg | ORAL_TABLET | Freq: Once | ORAL | Status: AC
  Administered 2018-05-10: 120 mg via ORAL
  Filled 2018-05-10: qty 3

## 2018-05-10 MED ORDER — VALACYCLOVIR HCL 500 MG PO TABS
1000.0000 mg | ORAL_TABLET | Freq: Once | ORAL | Status: AC
  Administered 2018-05-10: 1000 mg via ORAL
  Filled 2018-05-10: qty 2

## 2018-05-10 MED ORDER — COMBIVENT RESPIMAT 20-100 MCG/ACT IN AERS: 1.0000 | INHALATION_SPRAY | Freq: Four times a day (QID) | RESPIRATORY_TRACT | 0 refills | Status: DC | PRN

## 2018-05-10 MED ORDER — AMPHETAMINE-DEXTROAMPHETAMINE 10 MG PO TABS
20.0000 mg | ORAL_TABLET | Freq: Once | ORAL | Status: AC
  Administered 2018-05-10: 20 mg via ORAL
  Filled 2018-05-10: qty 2

## 2018-05-10 MED ORDER — MONTELUKAST SODIUM 10 MG PO TABS: ORAL_TABLET | ORAL | 9 refills | Status: DC

## 2018-05-10 MED ORDER — ALPRAZOLAM 2 MG PO TABS: 1.0000 mg | ORAL_TABLET | Freq: Three times a day (TID) | ORAL | 0 refills | Status: DC | PRN
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
## 2018-05-10 NOTE — ED Notes (Signed)
Call to Tiffany, RN, [facility] for meds
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
## 2018-05-10 NOTE — Discharge Instructions (Addendum)
Your evaluated in the emergency department after all your medications.  We are giving you 3 days worth of your medicines you will need to follow-up with your primary care doctor for further medications.  We are unable to prescribe you Subutex and you will need to contact your physician for this.
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
## 2018-05-13 ENCOUNTER — Telehealth: Payer: Self-pay | Admitting: *Deleted

## 2018-05-13 ENCOUNTER — Other Ambulatory Visit: Payer: Self-pay | Admitting: *Deleted

## 2018-05-13 DIAGNOSIS — B20 Human immunodeficiency virus [HIV] disease: Secondary | ICD-10-CM

## 2018-05-13 DIAGNOSIS — Z113 Encounter for screening for infections with a predominantly sexual mode of transmission: Secondary | ICD-10-CM

## 2018-05-13 NOTE — Telephone Encounter (Signed)
Thank you Michelle.  
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
## 2018-05-14 NOTE — Telephone Encounter (Signed)
He is coming Wednesday to get labs, pick up meds from pharmacy. He will need to make an appointment with Judeth CornfieldStephanie.

## 2018-05-15 ENCOUNTER — Other Ambulatory Visit: Payer: Medicaid Other

## 2018-05-16 ENCOUNTER — Telehealth: Payer: Self-pay | Admitting: Pharmacist Clinician (PhC)/ Clinical Pharmacy Specialist

## 2018-05-16 ENCOUNTER — Other Ambulatory Visit: Payer: Medicare Other

## 2018-05-16 DIAGNOSIS — Z113 Encounter for screening for infections with a predominantly sexual mode of transmission: Secondary | ICD-10-CM

## 2018-05-16 DIAGNOSIS — B20 Human immunodeficiency virus [HIV] disease: Secondary | ICD-10-CM

## 2018-05-16 MED ORDER — DORAVIRIN-LAMIVUDIN-TENOFOV DF 100-300-300 MG PO TABS
1.0000 | ORAL_TABLET | Freq: Every day | ORAL | 0 refills | Status: DC
Start: 2018-05-16 — End: 2019-02-06
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
## 2018-05-16 NOTE — Telephone Encounter (Signed)
Hopefully he will come to this appointment. Missed a few. Thank you for getting him on some medications.

## 2018-05-17 LAB — COMPLETE METABOLIC PANEL WITH GFR
AG RATIO: 1.8 (calc) (ref 1.0–2.5)
ALT: 10 U/L (ref 9–46)
AST: 17 U/L (ref 10–40)
Albumin: 4.2 g/dL (ref 3.6–5.1)
Alkaline phosphatase (APISO): 61 U/L (ref 40–115)
BUN: 8 mg/dL (ref 7–25)
CALCIUM: 9.4 mg/dL (ref 8.6–10.3)
CO2: 25 mmol/L (ref 20–32)
CREATININE: 1 mg/dL (ref 0.60–1.35)
Chloride: 105 mmol/L (ref 98–110)
GFR, Est African American: 112 mL/min/{1.73_m2} (ref >=60)
GFR, Est Non African American: 96 mL/min/{1.73_m2} (ref >=60)
GLUCOSE: 84 mg/dL (ref 65–99)
Globulin: 2.3 g/dL (calc) (ref 1.9–3.7)
POTASSIUM: 4.1 mmol/L (ref 3.5–5.3)
Sodium: 140 mmol/L (ref 135–146)
Total Bilirubin: 0.3 mg/dL (ref 0.2–1.2)
Total Protein: 6.5 g/dL (ref 6.1–8.1)

## 2018-05-17 LAB — CBC WITH DIFFERENTIAL/PLATELET
BASOS PCT: 0.5 %
Basophils Absolute: 38 cells/uL (ref 0–200)
EOS ABS: 113 {cells}/uL (ref 15–500)
Eosinophils Relative: 1.5 %
HCT: 41.3 % (ref 38.5–50.0)
HEMOGLOBIN: 14.5 g/dL (ref 13.2–17.1)
Lymphs Abs: 3150 cells/uL (ref 850–3900)
MCH: 31.9 pg (ref 27.0–33.0)
MCHC: 35.1 g/dL (ref 32.0–36.0)
MCV: 91 fL (ref 80.0–100.0)
MONOS PCT: 5.8 %
MPV: 10.4 fL (ref 7.5–12.5)
NEUTROS ABS: 3765 {cells}/uL (ref 1500–7800)
Neutrophils Relative %: 50.2 %
Platelets: 260 10*3/uL (ref 140–400)
RBC: 4.54 10*6/uL (ref 4.20–5.80)
RDW: 12.8 % (ref 11.0–15.0)
Total Lymphocyte: 42 %
WBC mixed population: 435 cells/uL (ref 200–950)
WBC: 7.5 10*3/uL (ref 3.8–10.8)

## 2018-05-17 LAB — T-HELPER CELL (CD4) - (RCID CLINIC ONLY)
CD4 % Helper T Cell: 35 % (ref 33–55)
CD4 T CELL ABS: 1070 /uL (ref 400–2700)

## 2018-05-17 LAB — FLUORESCENT TREPONEMAL AB(FTA)-IGG-BLD: FLUORESCENT TREPONEMAL ABS: REACTIVE — AB

## 2018-05-17 LAB — RPR TITER

## 2018-05-17 LAB — RPR: RPR: REACTIVE — AB

## 2018-05-21 LAB — HIV-1 RNA QUANT-NO REFLEX-BLD
HIV 1 RNA Quant: <20 copies/mL
HIV-1 RNA QUANT, LOG: NOT DETECTED {Log_copies}/mL

## 2018-05-22 ENCOUNTER — Telehealth: Payer: Self-pay

## 2018-05-22 NOTE — Telephone Encounter (Signed)
Pt called today stating he was treated for STI at urgent care and had questions about treatment. PT asked if he would still be able to be sexually active and how long it would take for the pt to be "fully cured". Informed pt that it takes two weeks for treatment to take full effect. Relayed to the pt that he must also inform previous partners that he has tested positive for an STI and that they must go to the health department for testing and treatment.  Randy Castillo, [place]

## 2018-06-03 ENCOUNTER — Ambulatory Visit (INDEPENDENT_AMBULATORY_CARE_PROVIDER_SITE_OTHER): Payer: Medicare Other | Admitting: Infectious Diseases

## 2018-06-03 ENCOUNTER — Encounter: Payer: Self-pay | Admitting: Infectious Diseases

## 2018-06-03 DIAGNOSIS — B20 Human immunodeficiency virus [HIV] disease: Secondary | ICD-10-CM | POA: Diagnosis not present

## 2018-06-03 DIAGNOSIS — R300 Dysuria: Secondary | ICD-10-CM | POA: Diagnosis present

## 2018-06-03 DIAGNOSIS — R112 Nausea with vomiting, unspecified: Secondary | ICD-10-CM | POA: Diagnosis not present

## 2018-06-03 MED ORDER — ONDANSETRON HCL 8 MG PO TABS: 8.0000 mg | ORAL_TABLET | Freq: Three times a day (TID) | ORAL | 0 refills | Status: DC | PRN

## 2018-06-03 NOTE — Patient Instructions (Addendum)
Try taking the zofran 30 minutes before the medication.  U=U --> Google this to see more about this  Please make a follow up appointment with Dr. Daiva EvesVan Dam in 6 months for follow up.

## 2018-06-05 DIAGNOSIS — R112 Nausea with vomiting, unspecified: Secondary | ICD-10-CM | POA: Insufficient documentation

## 2018-06-05 NOTE — Assessment & Plan Note (Signed)
Attributed to Delstrigo - zofran rx given.

## 2018-06-05 NOTE — Assessment & Plan Note (Addendum)
His biktarvy is being sent to him from pharmacy and should be here later this week. I asked him to please pre-medicate with zofran 339m prior to Delstrigo to see if this helps until he gets his biktarvy back. I am happy to have him see Dr. Daiva EvesVan Dam if he prefers a male provider. He has been under good control and undetectable on medications with exception of period of time surrounding incarceration. Discussed U=U concept in addition to safe sex counseling and prevention of other STIs. Also provided information regarding [place]/support groups as both he and his girlfriend were interested in this today.  He can return in 6 months to see Dr. Daiva EvesVan Dam.  Zofran 8mg  SL TID PRN for nausea/vomiting.
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
## 2018-06-05 NOTE — Assessment & Plan Note (Signed)
Treated presumptively for chlamydia infection earlier this month per chart review with azithromycin 1gm x1. No complaints today.

## 2018-06-24 ENCOUNTER — Other Ambulatory Visit: Payer: Self-pay | Admitting: Infectious Diseases

## 2018-07-18 ENCOUNTER — Other Ambulatory Visit: Payer: Self-pay | Admitting: Infectious Diseases

## 2018-09-26 DIAGNOSIS — F1190 Opioid use, unspecified, uncomplicated: Secondary | ICD-10-CM | POA: Insufficient documentation

## 2018-09-26 DIAGNOSIS — G43109 Migraine with aura, not intractable, without status migrainosus: Secondary | ICD-10-CM | POA: Insufficient documentation

## 2018-12-16 ENCOUNTER — Telehealth: Payer: Self-pay | Admitting: *Deleted

## 2018-12-16 DIAGNOSIS — B20 Human immunodeficiency virus [HIV] disease: Secondary | ICD-10-CM

## 2018-12-16 MED ORDER — BICTEGRAVIR-EMTRICITAB-TENOFOV 50-200-25 MG PO TABS: 1.0000 | ORAL_TABLET | Freq: Every day | ORAL | 1 refills | Status: DC

## 2018-12-16 NOTE — Telephone Encounter (Signed)
Patient called to update his contact information, asked for refill of Biktarvy to be sent to [facility] in [place], [place].  RN confirmed upcoming appointments with Dr Daiva Eves. He is not taking the Delstrigo. RN sent Biktarvy #30 plus 1 refill to patient's pharmacy. [name], RN

## 2018-12-20 ENCOUNTER — Other Ambulatory Visit: Payer: Self-pay

## 2018-12-20 MED ORDER — ONDANSETRON HCL 8 MG PO TABS: 8.0000 mg | ORAL_TABLET | Freq: Three times a day (TID) | ORAL | 0 refills | Status: DC | PRN

## 2019-01-17 DIAGNOSIS — R251 Tremor, unspecified: Secondary | ICD-10-CM | POA: Insufficient documentation

## 2019-01-21 ENCOUNTER — Other Ambulatory Visit: Payer: Medicare Other

## 2019-01-21 ENCOUNTER — Other Ambulatory Visit: Payer: Self-pay

## 2019-01-21 DIAGNOSIS — B20 Human immunodeficiency virus [HIV] disease: Secondary | ICD-10-CM

## 2019-01-21 DIAGNOSIS — Z79899 Other long term (current) drug therapy: Secondary | ICD-10-CM

## 2019-01-21 DIAGNOSIS — Z113 Encounter for screening for infections with a predominantly sexual mode of transmission: Secondary | ICD-10-CM

## 2019-01-22 LAB — T-HELPER CELLS (CD4) COUNT (NOT AT ARMC)
CD4 % Helper T Cell: 33 % (ref 33–55)
CD4 T CELL ABS: 660 /uL (ref 400–2700)

## 2019-01-23 DIAGNOSIS — R569 Unspecified convulsions: Secondary | ICD-10-CM | POA: Insufficient documentation

## 2019-01-23 LAB — CBC WITH DIFFERENTIAL/PLATELET
Absolute Monocytes: 348 cells/uL (ref 200–950)
BASOS ABS: 28 {cells}/uL (ref 0–200)
BASOS PCT: 0.6 %
EOS PCT: 3.2 %
Eosinophils Absolute: 150 cells/uL (ref 15–500)
HEMATOCRIT: 46.2 % (ref 38.5–50.0)
HEMOGLOBIN: 16 g/dL (ref 13.2–17.1)
LYMPHS ABS: 2002 {cells}/uL (ref 850–3900)
MCH: 32.4 pg (ref 27.0–33.0)
MCHC: 34.6 g/dL (ref 32.0–36.0)
MCV: 93.5 fL (ref 80.0–100.0)
MPV: 10.3 fL (ref 7.5–12.5)
Monocytes Relative: 7.4 %
NEUTROS ABS: 2171 {cells}/uL (ref 1500–7800)
Neutrophils Relative %: 46.2 %
Platelets: 264 10*3/uL (ref 140–400)
RBC: 4.94 10*6/uL (ref 4.20–5.80)
RDW: 13 % (ref 11.0–15.0)
Total Lymphocyte: 42.6 %
WBC: 4.7 10*3/uL (ref 3.8–10.8)

## 2019-01-23 LAB — LIPID PANEL
CHOL/HDL RATIO: 4.2 (calc) (ref <5.0)
Cholesterol: 196 mg/dL (ref <200)
HDL: 47 mg/dL (ref >=40)
LDL Cholesterol (Calc): 126 mg/dL (calc) — ABNORMAL HIGH
Non-HDL Cholesterol (Calc): 149 mg/dL (calc) — ABNORMAL HIGH (ref <130)
Triglycerides: 124 mg/dL (ref <150)

## 2019-01-23 LAB — COMPREHENSIVE METABOLIC PANEL
AG Ratio: 1.6 (calc) (ref 1.0–2.5)
ALBUMIN MSPROF: 4.5 g/dL (ref 3.6–5.1)
ALKALINE PHOSPHATASE (APISO): 54 U/L (ref 36–130)
ALT: 12 U/L (ref 9–46)
AST: 20 U/L (ref 10–40)
BUN: 12 mg/dL (ref 7–25)
CHLORIDE: 103 mmol/L (ref 98–110)
CO2: 27 mmol/L (ref 20–32)
Calcium: 10.2 mg/dL (ref 8.6–10.3)
Creat: 1.15 mg/dL (ref 0.60–1.35)
Globulin: 2.8 g/dL (calc) (ref 1.9–3.7)
Glucose, Bld: 89 mg/dL (ref 65–99)
Potassium: 4.5 mmol/L (ref 3.5–5.3)
Sodium: 139 mmol/L (ref 135–146)
Total Bilirubin: 0.4 mg/dL (ref 0.2–1.2)
Total Protein: 7.3 g/dL (ref 6.1–8.1)

## 2019-01-23 LAB — HIV-1 RNA QUANT-NO REFLEX-BLD
HIV 1 RNA QUANT: NOT DETECTED {copies}/mL
HIV-1 RNA Quant, Log: <1.3 Log copies/mL

## 2019-01-23 LAB — RPR: RPR Ser Ql: REACTIVE — AB

## 2019-01-23 LAB — FLUORESCENT TREPONEMAL AB(FTA)-IGG-BLD: Fluorescent Treponemal ABS: REACTIVE — AB

## 2019-01-23 LAB — RPR TITER: RPR Titer: 1:2 {titer} — ABNORMAL HIGH

## 2019-02-04 ENCOUNTER — Encounter: Payer: Self-pay | Admitting: Infectious Disease

## 2019-02-04 ENCOUNTER — Ambulatory Visit (INDEPENDENT_AMBULATORY_CARE_PROVIDER_SITE_OTHER): Payer: Medicare Other | Admitting: Infectious Disease

## 2019-02-04 VITALS — Wt 155.0 lb

## 2019-02-04 DIAGNOSIS — J329 Chronic sinusitis, unspecified: Secondary | ICD-10-CM

## 2019-02-04 DIAGNOSIS — F908 Attention-deficit hyperactivity disorder, other type: Secondary | ICD-10-CM | POA: Diagnosis present

## 2019-02-04 DIAGNOSIS — F411 Generalized anxiety disorder: Secondary | ICD-10-CM

## 2019-02-04 DIAGNOSIS — B20 Human immunodeficiency virus [HIV] disease: Secondary | ICD-10-CM

## 2019-02-04 HISTORY — DX: Chronic sinusitis, unspecified: J32.9

## 2019-02-04 MED ORDER — AMOXICILLIN-POT CLAVULANATE 875-125 MG PO TABS: 1.0000 | ORAL_TABLET | Freq: Two times a day (BID) | ORAL | 1 refills | Status: DC

## 2019-02-04 NOTE — Progress Notes (Signed)
Subjective:  Chief complaint is left-sided facial pain which she is concerned is a recurrent sinus infection  Patient ID: Randy Castillo, male    DOB: Jan 05, 1981, 38 y.o.   MRN: [id number]  HPI  38 year old man living with HIV that is been well controlled on Biktarvy.  He had some viremia last year when he was incarcerated.  He has been undetectable on Biktarvy recently.  He had been seeing any [facility] recently.  He previously been seen in Dr. Ninetta Lights but tells me he was upset that Dr. Ninetta Lights.Tylenol undetectable equals on transmissible.  I tried to clarify the fact that this idea did not reach its current day certainty until 2018 and was certainly not yet widespread in its acceptance until recent time.  Certainly the HPTN 053 study had already shown evidence of this but the partner studies had not yet been completed.  He seems to be doing well on his antiretroviral therapy.  His lover is on preexposure prophylaxis.  He is complaining of recurrent sinus tract infections including one that was treated with clindamycin and another one treated with doxycycline recently and now is complaining of facial pain.  He denies fevers nausea or other systemic symptoms. Past Medical History:  Diagnosis Date  . Anxiety   . Asthma   . Bipolar affective disorder, manic, in partial or remission (HCC)   . Environmental allergies   . Gonorrhea in male   . Hepatitis C virus infection resolved after antiviral drug therapy 05/03/2016  . Herpes   . HIV disease (HCC)   . Hypertension   . Migraine   . Neuropathic pain   . Opioid dependence on agonist therapy (HCC)   . PTSD (post-traumatic stress disorder)   . Recurrent sinus infections 02/04/2019  . Seizures (HCC)     Past Surgical History:  Procedure Laterality Date  . CYST EXCISION    . ESOPHAGOGASTRODUODENOSCOPY  2008   Dr. Darrick Penna: Erosive reflux esophagitis, gastritis, small bowel biopsy negative for celiac, gastric biopsy negative for H. pylori     Family History  Family history unknown: Yes      Social History   Socioeconomic History  . Marital status: Significant Other    Spouse name: Not on file  . Number of children: 1  . Years of education: Not on file  . Highest education level: Not on file  Occupational History  . Not on file  Social Needs  . Financial resource strain: Not on file  . Food insecurity:    Worry: Not on file    Inability: Not on file  . Transportation needs:    Medical: Not on file    Non-medical: Not on file  Tobacco Use  . Smoking status: Current Every Day Smoker    Packs/day: 1.00    Years: 16.00    Pack years: 16.00    Types: Cigarettes    Start date: 12/11/1994  . Smokeless tobacco: Never Used  . Tobacco comment: Is vaping to help reduce cigarette use  Substance and Sexual Activity  . Alcohol use: No    Alcohol/week: 0.0 standard drinks  . Drug use: Yes    Types: Marijuana    Comment: drugs in mid-20s. intranasal/iv  . Sexual activity: Not Currently    Partners: Female, Male    Birth control/protection: Condom    Comment: pt. declined condoms  Lifestyle  . Physical activity:    Days per week: Not on file    Minutes per session: Not on file  .  Stress: Not on file  Relationships  . Social connections:    Talks on phone: Not on file    Gets together: Not on file    Attends religious service: Not on file    Active member of club or organization: Not on file    Attends meetings of clubs or organizations: Not on file    Relationship status: Not on file  Other Topics Concern  . Not on file  Social History Narrative  . Not on file    Allergies  Allergen Reactions  . Zanaflex [Tizanidine Hcl]     Seizure  . Diclofenac Sodium Hives    Interaction with HIV medications   . Naloxone Other (See Comments)    headache  . Propoxyphene Nausea And Vomiting  . Darvocet [Propoxyphene N-Acetaminophen] Nausea And Vomiting     Current Outpatient Medications:  .  albuterol  (PROVENTIL HFA;VENTOLIN HFA) 108 (90 Base) MCG/ACT inhaler, Inhale 2 puffs into the lungs every 6 (six) hours as needed for wheezing., Disp: 1 Inhaler, Rfl: 3 .  alprazolam (XANAX) 2 MG tablet, Take 0.5-1 tablets (1-2 mg total) by mouth 3 (three) times daily as needed for sleep or anxiety., Disp: 10 tablet, Rfl: 0 .  amoxicillin-clavulanate (AUGMENTIN) 875-125 MG tablet, Take 1 tablet by mouth 2 (two) times daily., Disp: 28 tablet, Rfl: 1 .  amphetamine-dextroamphetamine (ADDERALL) 20 MG tablet, Take 1 tablet (20 mg total) by mouth 4 (four) times daily., Disp: 12 tablet, Rfl: 0 .  bictegravir-emtricitabine-tenofovir AF (BIKTARVY) 50-200-25 MG TABS tablet, Take 1 tablet by mouth daily. Try to take at the same time each day with or without food., Disp: 30 tablet, Rfl: 1 .  buprenorphine (SUBUTEX) 8 MG SUBL SL tablet, Place under the tongue., Disp: , Rfl:  .  COMBIVENT RESPIMAT 20-100 MCG/ACT AERS respimat, Inhale 1 puff into the lungs every 6 (six) hours as needed., Disp: 1 Inhaler, Rfl: 0 .  doravirin-lamivudin-tenofov df (DELSTRIGO) 100-300-300 MG TABS per tablet, Take 1 tablet by mouth daily. (Patient not taking: Reported on 06/03/2018), Disp: 30 tablet, Rfl: 0 .  fluticasone (FLONASE) 50 MCG/ACT nasal spray, USE 2 SPRAYS IEN D, Disp: 16 g, Rfl: 0 .  LATUDA 120 MG TABS, TK 1 T PO  QD, Disp: 3 tablet, Rfl: 0 .  montelukast (SINGULAIR) 10 MG tablet, TK 1 T PO QHS, Disp: 3 tablet, Rfl: 9 .  NICOTINE STEP 1 21 MG/24HR patch, Place 1 patch (21 mg total) onto the skin daily., Disp: 3 patch, Rfl: 0 .  ondansetron (ZOFRAN) 8 MG tablet, Take 1 tablet (8 mg total) by mouth every 8 (eight) hours as needed for nausea or vomiting., Disp: 30 tablet, Rfl: 0 .  valACYclovir (VALTREX) 1000 MG tablet, Take 1 tablet (1,000 mg total) by mouth daily. *MAY TAKE ONE TABLET IN ADDITION AS NEEDED, Disp: 12 tablet, Rfl: 0  Review of Systems  Constitutional: Negative for chills and fever.  HENT: Positive for sinus pressure  and sinus pain. Negative for congestion and sore throat.   Eyes: Negative for photophobia.  Respiratory: Negative for cough, shortness of breath and wheezing.   Cardiovascular: Negative for chest pain, palpitations and leg swelling.  Gastrointestinal: Negative for abdominal pain, blood in stool, constipation, diarrhea, nausea and vomiting.  Genitourinary: Negative for dysuria, flank pain and hematuria.  Musculoskeletal: Negative for back pain and myalgias.  Skin: Negative for rash.  Neurological: Negative for dizziness, weakness and headaches.  Hematological: Does not bruise/bleed easily.  Psychiatric/Behavioral: Negative for behavioral problems,  confusion, decreased concentration and suicidal ideas.       Objective:   Physical Exam Constitutional:      General: He is not in acute distress.    Appearance: Normal appearance. He is well-developed. He is not ill-appearing or diaphoretic.  HENT:     Head: Normocephalic and atraumatic.      Right Ear: Hearing and external ear normal.     Left Ear: Hearing and external ear normal.     Nose: No nasal deformity or rhinorrhea.  Eyes:     General: No scleral icterus.    Conjunctiva/sclera: Conjunctivae normal.     Right eye: Right conjunctiva is not injected.     Left eye: Left conjunctiva is not injected.     Pupils: Pupils are equal, round, and reactive to light.  Neck:     Musculoskeletal: Normal range of motion and neck supple.     Vascular: No JVD.  Cardiovascular:     Rate and Rhythm: Normal rate and regular rhythm.     Heart sounds: Normal heart sounds, S1 normal and S2 normal. No murmur. No friction rub.  Abdominal:     General: Bowel sounds are normal. There is no distension.     Palpations: Abdomen is soft.     Tenderness: There is no abdominal tenderness.  Musculoskeletal: Normal range of motion.     Right shoulder: Normal.     Left shoulder: Normal.     Right hip: Normal.     Left hip: Normal.     Right knee: Normal.       Left knee: Normal.  Lymphadenopathy:     Head:     Right side of head: No submandibular, preauricular or posterior auricular adenopathy.     Left side of head: No submandibular, preauricular or posterior auricular adenopathy.     Cervical: No cervical adenopathy.     Right cervical: No superficial or deep cervical adenopathy.    Left cervical: No superficial or deep cervical adenopathy.  Skin:    General: Skin is warm and dry.     Coloration: Skin is not pale.     Findings: No abrasion, bruising, ecchymosis, erythema, lesion or rash.     Nails: There is no clubbing.   Neurological:     Mental Status: He is alert and oriented to person, place, and time.     Sensory: No sensory deficit.     Coordination: Coordination normal.     Gait: Gait normal.  Psychiatric:        Attention and Perception: He is attentive.        Mood and Affect: Mood is anxious.        Speech: Speech is rapid and pressured.        Behavior: Behavior normal. Behavior is cooperative.        Thought Content: Thought content normal.        Judgment: Judgment normal.           Assessment & Plan:   Recurrent sinusitis: Some of this seems to be potentially exaggerated on his part but I go ahead and give him a prescription with Augmentin and check immunoglobulin levels.  I told him if he has another recurrence I am sending him to an ear nose and throat doctor for referral if I do not find an explanation for these problems.  He does have problems with seasonal allergies and I suspect some of his symptoms may be allowing this.  Dental problems  insist the tooth that is bothering him is not near where his sinuses are but will refer him to dental.  HIV disease continue Biktarvy.  Bipolar disorder follow-up with his primary care physician and psychiatrist.

## 2019-02-06 ENCOUNTER — Other Ambulatory Visit: Payer: Self-pay | Admitting: Behavioral Health

## 2019-02-06 DIAGNOSIS — B20 Human immunodeficiency virus [HIV] disease: Secondary | ICD-10-CM

## 2019-02-06 LAB — IGG, IGA, IGM
IgG (Immunoglobin G), Serum: 976 mg/dL (ref 600–1640)
IgM, Serum: 53 mg/dL (ref 50–300)
Immunoglobulin A: 187 mg/dL (ref 47–310)

## 2019-02-06 LAB — IGE: IgE (Immunoglobulin E), Serum: 5 kU/L (ref <=114)

## 2019-02-06 MED ORDER — BICTEGRAVIR-EMTRICITAB-TENOFOV 50-200-25 MG PO TABS: 1.0000 | ORAL_TABLET | Freq: Every day | ORAL | 2 refills | Status: DC

## 2019-02-06 MED ORDER — AMOXICILLIN-POT CLAVULANATE 875-125 MG PO TABS: 1.0000 | ORAL_TABLET | Freq: Two times a day (BID) | ORAL | 1 refills | Status: DC

## 2019-02-06 NOTE — Addendum Note (Signed)
Addended by: [name] on: 02/06/2019 04:58 PM   Modules accepted: Orders

## 2019-02-07 ENCOUNTER — Telehealth: Payer: Self-pay

## 2019-02-07 ENCOUNTER — Other Ambulatory Visit: Payer: Self-pay

## 2019-02-07 DIAGNOSIS — B20 Human immunodeficiency virus [HIV] disease: Secondary | ICD-10-CM

## 2019-02-07 DIAGNOSIS — R112 Nausea with vomiting, unspecified: Secondary | ICD-10-CM

## 2019-02-07 MED ORDER — ONDANSETRON HCL 8 MG PO TABS: 8.0000 mg | ORAL_TABLET | Freq: Three times a day (TID) | ORAL | 0 refills | Status: DC | PRN

## 2019-02-07 NOTE — Telephone Encounter (Signed)
Patient called office today stating he has been having vomiting episodes since earlier this week. Patient would like to know if his lab work would provide explanation for the nausea. Patient also requesting refills for zofran to be sent into pharmacy. Patient states that he only has three tablets on hand. Zofran refill sent into pharmacy per patient's request. Patient would like to know if he would be able to restart medication for appetite. Patient states that either Dr. Ninetta Lights or Rexene Alberts, Np prescribed a liquid medication that helped increase his appetite. Will route message to MD to advise if patient can have refill on medication for appetite. Advised patient to go to urgent care if he feels like nausea is failing to improve or worsens. Lorenso Courier, [place]

## 2019-02-10 NOTE — Telephone Encounter (Signed)
I have no idea why he has had vomiting based on his blood work.  He is having trouble tolerating the antibiotics we prescribed for his sinus infection?.  I have very little enthusiasm for megestrol as far as a weight stimulating drug I think it is too risky a drug to give to patients for given his risk for DVT and adrenal insufficiency since he.  I am okay if he wants to be on some Zofran for the nausea and I am happy with him having 4 mg every 8 hours as needed and having 60 tablets called in

## 2019-02-11 NOTE — Telephone Encounter (Signed)
Attempted to call patient to see if how he is feeling. Left voicemail requesting patient to call back with an update. Randy Castillo

## 2019-02-18 ENCOUNTER — Ambulatory Visit: Payer: Medicare Other | Admitting: Licensed Clinical Social Worker

## 2019-02-21 ENCOUNTER — Ambulatory Visit (INDEPENDENT_AMBULATORY_CARE_PROVIDER_SITE_OTHER): Payer: Medicare Other | Admitting: Licensed Clinical Social Worker

## 2019-02-21 ENCOUNTER — Other Ambulatory Visit: Payer: Self-pay

## 2019-02-21 DIAGNOSIS — F310 Bipolar disorder, current episode hypomanic: Secondary | ICD-10-CM | POA: Diagnosis present
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
## 2019-03-06 ENCOUNTER — Ambulatory Visit: Payer: Medicare Other | Admitting: Licensed Clinical Social Worker

## 2019-03-11 ENCOUNTER — Ambulatory Visit: Payer: Medicare Other | Admitting: Licensed Clinical Social Worker

## 2019-03-12 ENCOUNTER — Other Ambulatory Visit: Payer: Self-pay

## 2019-03-12 ENCOUNTER — Ambulatory Visit (INDEPENDENT_AMBULATORY_CARE_PROVIDER_SITE_OTHER): Payer: Medicare Other | Admitting: Licensed Clinical Social Worker

## 2019-03-12 DIAGNOSIS — F310 Bipolar disorder, current episode hypomanic: Secondary | ICD-10-CM
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
## 2019-03-17 ENCOUNTER — Other Ambulatory Visit: Payer: Self-pay

## 2019-03-17 ENCOUNTER — Ambulatory Visit: Payer: Medicare Other | Admitting: Licensed Clinical Social Worker

## 2019-03-20 ENCOUNTER — Ambulatory Visit: Payer: Medicare Other | Admitting: Licensed Clinical Social Worker

## 2019-04-02 ENCOUNTER — Ambulatory Visit: Payer: Medicare Other | Admitting: Infectious Disease

## 2019-04-07 ENCOUNTER — Other Ambulatory Visit: Payer: Self-pay | Admitting: Infectious Disease

## 2019-04-07 DIAGNOSIS — B20 Human immunodeficiency virus [HIV] disease: Secondary | ICD-10-CM

## 2019-04-07 DIAGNOSIS — R112 Nausea with vomiting, unspecified: Secondary | ICD-10-CM

## 2019-04-14 ENCOUNTER — Encounter: Payer: Self-pay | Admitting: Infectious Disease

## 2019-04-14 ENCOUNTER — Other Ambulatory Visit: Payer: Self-pay

## 2019-04-14 ENCOUNTER — Ambulatory Visit (INDEPENDENT_AMBULATORY_CARE_PROVIDER_SITE_OTHER): Payer: Medicare Other | Admitting: Infectious Disease

## 2019-04-14 DIAGNOSIS — F901 Attention-deficit hyperactivity disorder, predominantly hyperactive type: Secondary | ICD-10-CM | POA: Diagnosis not present

## 2019-04-14 DIAGNOSIS — F3110 Bipolar disorder, current episode manic without psychotic features, unspecified: Secondary | ICD-10-CM

## 2019-04-14 DIAGNOSIS — B20 Human immunodeficiency virus [HIV] disease: Secondary | ICD-10-CM | POA: Diagnosis not present

## 2019-04-14 DIAGNOSIS — J329 Chronic sinusitis, unspecified: Secondary | ICD-10-CM

## 2019-04-14 DIAGNOSIS — F411 Generalized anxiety disorder: Secondary | ICD-10-CM

## 2019-04-14 MED ORDER — QUETIAPINE FUMARATE 200 MG PO TABS: 200.0000 mg | ORAL_TABLET | Freq: Every day | ORAL | 0 refills | Status: DC

## 2019-04-14 MED ORDER — TOPIRAMATE 25 MG PO CPSP: 50.0000 mg | ORAL_CAPSULE | Freq: Two times a day (BID) | ORAL | 0 refills | Status: DC

## 2019-04-14 NOTE — Progress Notes (Signed)
Virtual Visit via Telephone Note  I connected with Randy Castillo on 04/14/19 at  8:45 AM EDT by telephone and verified that I am speaking with the correct person using two identifiers.  Location: Patient: home Provider:[facility]   I discussed the limitations, risks, security and privacy concerns of performing an evaluation and management service by telephone and the availability of in person appointments. I also discussed with the patient that there may be a patient responsible charge related to this service. The patient expressed understanding and agreed to proceed.   History of Present Illness:   Randy Castillo is a 38 year old Caucasian man living with HIV who has comorbid ADHD, bipolar disorder anxiety and migraine headaches.  He also suffers from some chronic sinus complaints that he believed to be a form of chronic action sinusitis) though I was skeptical of this being the case)  He has been well maintained on his Biktarvy.  He is been talking to Randy Castillo over the phone for counseling.  He also has a psychiatrist who is added in Topamax to prevent his migraine headaches.  He also continues to take Xanax for his anxiety as well as Adderall for his ADHD.  He states his headaches are doing much better now that he is taking the Topamax.  However still have considerable amount of anxiety.  I have asked him to make sure that he does talk with Randy Castillo and continues that therapeutic relationship.  That he is doing fairly well and doing good job of "sheltering in place."  Otherwise 12 point review of systems was negative   Past Medical History:  Diagnosis Date  . Anxiety   . Asthma   . Bipolar affective disorder, manic, in partial or remission (HCC)   . Environmental allergies   . Gonorrhea in male   . Hepatitis C virus infection resolved after antiviral drug therapy 05/03/2016  . Herpes   . HIV disease (HCC)   . Hypertension   . Migraine   . Neuropathic pain   . Opioid dependence on  agonist therapy (HCC)   . PTSD (post-traumatic stress disorder)   . Recurrent sinus infections 02/04/2019  . Seizures (HCC)     Past Surgical History:  Procedure Laterality Date  . CYST EXCISION    . ESOPHAGOGASTRODUODENOSCOPY  2008   Dr. Darrick Pennafields: Erosive reflux esophagitis, gastritis, small bowel biopsy negative for celiac, gastric biopsy negative for H. pylori    Family History  Family history unknown: Yes      Social History   Socioeconomic History  . Marital status: Significant Other    Spouse name: Not on file  . Number of children: 1  . Years of education: Not on file  . Highest education level: Not on file  Occupational History  . Not on file  Social Needs  . Financial resource strain: Not on file  . Food insecurity:    Worry: Not on file    Inability: Not on file  . Transportation needs:    Medical: Not on file    Non-medical: Not on file  Tobacco Use  . Smoking status: Current Every Day Smoker    Packs/day: 1.00    Years: 16.00    Pack years: 16.00    Types: Cigarettes    Start date: 12/11/1994  . Smokeless tobacco: Never Used  . Tobacco comment: Is vaping to help reduce cigarette use  Substance and Sexual Activity  . Alcohol use: No    Alcohol/week: 0.0 standard drinks  . Drug use:  Yes    Types: Marijuana    Comment: drugs in mid-20s. intranasal/iv  . Sexual activity: Not Currently    Partners: Female, Male    Birth control/protection: Condom    Comment: pt. declined condoms  Lifestyle  . Physical activity:    Days per week: Not on file    Minutes per session: Not on file  . Stress: Not on file  Relationships  . Social connections:    Talks on phone: Not on file    Gets together: Not on file    Attends religious service: Not on file    Active member of club or organization: Not on file    Attends meetings of clubs or organizations: Not on file    Relationship status: Not on file  Other Topics Concern  . Not on file  Social History Narrative   . Not on file    Allergies  Allergen Reactions  . Zanaflex [Tizanidine Hcl]     Seizure  . Diclofenac Sodium Hives    Interaction with HIV medications   . Naloxone Other (See Comments)    headache  . Propoxyphene Nausea And Vomiting  . Darvocet [Propoxyphene N-Acetaminophen] Nausea And Vomiting     Current Outpatient Medications:  .  albuterol (PROVENTIL HFA;VENTOLIN HFA) 108 (90 Base) MCG/ACT inhaler, Inhale 2 puffs into the lungs every 6 (six) hours as needed for wheezing., Disp: 1 Inhaler, Rfl: 3 .  alprazolam (XANAX) 2 MG tablet, Take 0.5-1 tablets (1-2 mg total) by mouth 3 (three) times daily as needed for sleep or anxiety., Disp: 10 tablet, Rfl: 0 .  amphetamine-dextroamphetamine (ADDERALL) 20 MG tablet, Take 1 tablet (20 mg total) by mouth 4 (four) times daily., Disp: 12 tablet, Rfl: 0 .  bictegravir-emtricitabine-tenofovir AF (BIKTARVY) 50-200-25 MG TABS tablet, Take 1 tablet by mouth daily. Try to take at the same time each day with or without food., Disp: 30 tablet, Rfl: 2 .  buprenorphine (SUBUTEX) 8 MG SUBL SL tablet, Place under the tongue., Disp: , Rfl:  .  COMBIVENT RESPIMAT 20-100 MCG/ACT AERS respimat, Inhale 1 puff into the lungs every 6 (six) hours as needed., Disp: 1 Inhaler, Rfl: 0 .  fluticasone (FLONASE) 50 MCG/ACT nasal spray, USE 2 SPRAYS IEN D, Disp: 16 g, Rfl: 0 .  LATUDA 120 MG TABS, TK 1 T PO  QD, Disp: 3 tablet, Rfl: 0 .  montelukast (SINGULAIR) 10 MG tablet, TK 1 T PO QHS, Disp: 3 tablet, Rfl: 9 .  NICOTINE STEP 1 21 MG/24HR patch, Place 1 patch (21 mg total) onto the skin daily., Disp: 3 patch, Rfl: 0 .  ondansetron (ZOFRAN-ODT) 8 MG disintegrating tablet, DISSOLVE 1 TABLET BY MOUTH EVERY 8 HOURS AS NEEDED FOR NAUSEA AND VOMITING, Disp: 30 tablet, Rfl: 0 .  QUEtiapine (SEROQUEL) 200 MG tablet, Take 1 tablet (200 mg total) by mouth at bedtime., Disp: 30 tablet, Rfl: 0 .  topiramate (TOPAMAX) 25 MG capsule, Take 2 capsules (50 mg total) by mouth 2 (two)  times daily., Disp: 120 capsule, Rfl: 0 .  valACYclovir (VALTREX) 1000 MG tablet, Take 1 tablet (1,000 mg total) by mouth daily. *MAY TAKE ONE TABLET IN ADDITION AS NEEDED, Disp: 12 tablet, Rfl: 0     Observations/Objective:  HIV disease  Migraine headaches  Depression with bipolar disorder  ADHD  Anxiety:  Assessment and Plan:  HIV disease well controlled have sent a new prescription for Biktarvy and scheduled a follow-up appoint with him in September with same-day labs.  Migraine headache scale: Doing well on Topamax prophylactic therapy  Depression with bipolar disorder and anxiety: Needs to continue his therapy relationship with Randy Kocher and continue his medications to continue follow-up with psychiatry.    Follow Up Instructions:    I discussed the assessment and treatment plan with the patient. The patient was provided an opportunity to ask questions and all were answered. The patient agreed with the plan and demonstrated an understanding of the instructions.   The patient was advised to call back or seek an in-person evaluation if the symptoms worsen or if the condition fails to improve as anticipated.  I provided 25 minutes of non-face-to-face time during this encounter.   Acey Lav, MD

## 2019-04-15 ENCOUNTER — Telehealth: Payer: Self-pay | Admitting: Licensed Clinical Social Worker

## 2019-04-15 ENCOUNTER — Ambulatory Visit: Payer: Medicare Other | Admitting: Licensed Clinical Social Worker

## 2019-04-18 DIAGNOSIS — R238 Other skin changes: Secondary | ICD-10-CM | POA: Insufficient documentation

## 2019-07-31 ENCOUNTER — Ambulatory Visit: Payer: Medicare Other

## 2019-08-13 ENCOUNTER — Other Ambulatory Visit: Payer: Self-pay

## 2019-08-13 DIAGNOSIS — B20 Human immunodeficiency virus [HIV] disease: Secondary | ICD-10-CM

## 2019-08-13 MED ORDER — BICTEGRAVIR-EMTRICITAB-TENOFOV 50-200-25 MG PO TABS: 1.0000 | ORAL_TABLET | Freq: Every day | ORAL | 3 refills | Status: DC

## 2019-08-26 ENCOUNTER — Telehealth: Payer: Self-pay

## 2019-08-26 NOTE — Telephone Encounter (Signed)
COVID-19 Pre-Screening Questions:08/26/19  Do you currently have a fever (>100 F), chills or unexplained body aches? NO  Are you currently experiencing new cough, shortness of breath, sore throat, runny nose? NO .  Have you recently travelled outside the state of [place] in the last 14 days? NO .  Have you been in contact with someone that is currently pending confirmation of Covid19 testing or has been confirmed to have the Covid19 virus?  NO  **If the patient answers NO to ALL questions -  advise the patient to please call the clinic before coming to the office should any symptoms develop.    

## 2019-08-27 ENCOUNTER — Other Ambulatory Visit: Payer: Self-pay | Admitting: Infectious Disease

## 2019-08-27 ENCOUNTER — Ambulatory Visit (INDEPENDENT_AMBULATORY_CARE_PROVIDER_SITE_OTHER): Payer: Medicare Other | Admitting: Infectious Disease

## 2019-08-27 ENCOUNTER — Other Ambulatory Visit: Payer: Self-pay

## 2019-08-27 ENCOUNTER — Emergency Department (HOSPITAL_COMMUNITY)
Admission: EM | Admit: 2019-08-27 | Discharge: 2019-08-27 | Discharge status: Against Medical Advice | Payer: Medicare Other

## 2019-08-27 ENCOUNTER — Other Ambulatory Visit (HOSPITAL_COMMUNITY)
Admission: RE | Admit: 2019-08-27 | Discharge: 2019-08-27 | Disposition: A | Discharge status: Home / Self Care | Payer: Medicare Other | Source: Ambulatory Visit | Attending: Infectious Disease | Admitting: Infectious Disease

## 2019-08-27 ENCOUNTER — Encounter: Payer: Self-pay | Admitting: Infectious Disease

## 2019-08-27 VITALS — BP 129/80 | HR 96 | Temp 98.7°F

## 2019-08-27 DIAGNOSIS — B20 Human immunodeficiency virus [HIV] disease: Secondary | ICD-10-CM | POA: Insufficient documentation

## 2019-08-27 DIAGNOSIS — F901 Attention-deficit hyperactivity disorder, predominantly hyperactive type: Secondary | ICD-10-CM

## 2019-08-27 DIAGNOSIS — Z8619 Personal history of other infectious and parasitic diseases: Secondary | ICD-10-CM

## 2019-08-27 DIAGNOSIS — Z23 Encounter for immunization: Secondary | ICD-10-CM | POA: Diagnosis not present

## 2019-08-27 DIAGNOSIS — Z79899 Other long term (current) drug therapy: Secondary | ICD-10-CM

## 2019-08-27 MED ORDER — BICTEGRAVIR-EMTRICITAB-TENOFOV 50-200-25 MG PO TABS: 1.0000 | ORAL_TABLET | Freq: Every day | ORAL | 11 refills | Status: DC

## 2019-08-27 MED ORDER — BICTEGRAVIR-EMTRICITAB-TENOFOV 50-200-25 MG PO TABS: 1.0000 | ORAL_TABLET | Freq: Every day | ORAL | 3 refills | Status: DC

## 2019-08-27 NOTE — Progress Notes (Signed)
Subjective:  Chief complaint: He was concerned the nurse here had suggested that he might of been a person who injected intravenous drugs  Patient ID: Randy Castillo, male    DOB: [birth year], 38 y.o.   MRN: [id number]  HPI  38year-old man living with HIV that is been well controlled on Biktarvy.  He had some viremia last year when he was incarcerated.  He has been undetectable on Biktarvy recently.  He has been highly adherent since then and doing well.  I connected him via telephone visit recently.  He states that he is in a monogamous relationship currently but would be like to be tested for STIs at genital and extra genital sites.  He was concerned because he stated that her nurse had accused him of being an intravenous drug user.  I cannot find documentation of that I cannot find documentation of chronic opioid addiction as mentioned by his primary care physician over at [facility] who is prescribing buprenorphine.  Otherwise he is doing relatively well.    He denies fevers nausea or other systemic symptoms. Past Medical History:  Diagnosis Date  . Anxiety   . Asthma   . Bipolar affective disorder, manic, in partial or remission ([place])   . Environmental allergies   . Gonorrhea in male   . Hepatitis C virus infection resolved after antiviral drug therapy 05/03/2016  . Herpes   . HIV disease ([place])   . Hypertension   . Migraine   . Neuropathic pain   . Opioid dependence on agonist therapy ([place])   . PTSD (post-traumatic stress disorder)   . Recurrent sinus infections 02/04/2019  . Seizures ([place])     Past Surgical History:  Procedure Laterality Date  . CYST EXCISION    . ESOPHAGOGASTRODUODENOSCOPY  2008   Dr. Oneida Alar: Erosive reflux esophagitis, gastritis, small bowel biopsy negative for celiac, gastric biopsy negative for H. pylori    Family History  Family history unknown: Yes      Social History   Socioeconomic History  . Marital status: Significant Other     Spouse name: Not on file  . Number of children: 1  . Years of education: Not on file  . Highest education level: Not on file  Occupational History  . Not on file  Social Needs  . Financial resource strain: Not on file  . Food insecurity    Worry: Not on file    Inability: Not on file  . Transportation needs    Medical: Not on file    Non-medical: Not on file  Tobacco Use  . Smoking status: Current Every Day Smoker    Packs/day: 1.00    Years: 16.00    Pack years: 16.00    Types: Cigarettes    Start date: 12/11/1994  . Smokeless tobacco: Never Used  . Tobacco comment: Is vaping to help reduce cigarette use  Substance and Sexual Activity  . Alcohol use: No    Alcohol/week: 0.0 standard drinks  . Drug use: Not Currently    Types: Marijuana    Comment: drugs in mid-20s. intranasal/iv  . Sexual activity: Not Currently    Partners: Female, Male    Birth control/protection: Condom    Comment: pt. declined condoms  Lifestyle  . Physical activity    Days per week: Not on file    Minutes per session: Not on file  . Stress: Not on file  Relationships  . Social Herbalist on phone: Not  on file    Gets together: Not on file    Attends religious service: Not on file    Active member of club or organization: Not on file    Attends meetings of clubs or organizations: Not on file    Relationship status: Not on file  Other Topics Concern  . Not on file  Social History Narrative  . Not on file    Allergies  Allergen Reactions  . Zanaflex [Tizanidine Hcl]     Seizure  . Diclofenac Sodium Hives    Interaction with HIV medications   . Naloxone Other (See Comments)    headache  . Propoxyphene Nausea And Vomiting  . Darvocet [Propoxyphene N-Acetaminophen] Nausea And Vomiting     Current Outpatient Medications:  .  albuterol (PROVENTIL HFA;VENTOLIN HFA) 108 (90 Base) MCG/ACT inhaler, Inhale 2 puffs into the lungs every 6 (six) hours as needed for wheezing., Disp:  1 Inhaler, Rfl: 3 .  alprazolam (XANAX) 2 MG tablet, Take 0.5-1 tablets (1-2 mg total) by mouth 3 (three) times daily as needed for sleep or anxiety., Disp: 10 tablet, Rfl: 0 .  amphetamine-dextroamphetamine (ADDERALL) 20 MG tablet, Take 1 tablet (20 mg total) by mouth 4 (four) times daily., Disp: 12 tablet, Rfl: 0 .  bictegravir-emtricitabine-tenofovir AF (BIKTARVY) 50-200-25 MG TABS tablet, Take 1 tablet by mouth daily. Try to take at the same time each day with or without food., Disp: 30 tablet, Rfl: 11 .  buprenorphine (SUBUTEX) 8 MG SUBL SL tablet, Place under the tongue., Disp: , Rfl:  .  COMBIVENT RESPIMAT 20-100 MCG/ACT AERS respimat, Inhale 1 puff into the lungs every 6 (six) hours as needed., Disp: 1 Inhaler, Rfl: 0 .  fluticasone (FLONASE) 50 MCG/ACT nasal spray, USE 2 SPRAYS IEN D, Disp: 16 g, Rfl: 0 .  LATUDA 120 MG TABS, TK 1 T PO  QD, Disp: 3 tablet, Rfl: 0 .  montelukast (SINGULAIR) 10 MG tablet, TK 1 T PO QHS, Disp: 3 tablet, Rfl: 9 .  NICOTINE STEP 1 21 MG/24HR patch, Place 1 patch (21 mg total) onto the skin daily., Disp: 3 patch, Rfl: 0 .  ondansetron (ZOFRAN-ODT) 8 MG disintegrating tablet, DISSOLVE 1 TABLET BY MOUTH EVERY 8 HOURS AS NEEDED FOR NAUSEA AND VOMITING, Disp: 30 tablet, Rfl: 0 .  pantoprazole (PROTONIX) 40 MG tablet, TAKE 1 TABLET BY MOUTH EVERY DAY, Disp: , Rfl:  .  QUEtiapine (SEROQUEL) 200 MG tablet, Take 1 tablet (200 mg total) by mouth at bedtime., Disp: 30 tablet, Rfl: 0 .  topiramate (TOPAMAX) 25 MG capsule, Take 2 capsules (50 mg total) by mouth 2 (two) times daily., Disp: 120 capsule, Rfl: 0 .  valACYclovir (VALTREX) 1000 MG tablet, Take 1 tablet (1,000 mg total) by mouth daily. *MAY TAKE ONE TABLET IN ADDITION AS NEEDED, Disp: 12 tablet, Rfl: 0  Review of Systems  Constitutional: Negative for chills and fever.  HENT: Negative for congestion, sinus pressure, sinus pain and sore throat.   Eyes: Negative for photophobia.  Respiratory: Negative for cough,  shortness of breath and wheezing.   Cardiovascular: Negative for chest pain, palpitations and leg swelling.  Gastrointestinal: Negative for abdominal pain, blood in stool, constipation, diarrhea, nausea and vomiting.  Genitourinary: Negative for dysuria, flank pain and hematuria.  Musculoskeletal: Negative for back pain and myalgias.  Skin: Negative for rash.  Neurological: Negative for dizziness, weakness and headaches.  Hematological: Does not bruise/bleed easily.  Psychiatric/Behavioral: Negative for behavioral problems, confusion, decreased concentration and suicidal ideas.  Objective:   Physical Exam Constitutional:      General: He is not in acute distress.    Appearance: Normal appearance. He is well-developed. He is not ill-appearing or diaphoretic.  HENT:     Head: Normocephalic and atraumatic.     Right Ear: Hearing and external ear normal.     Left Ear: Hearing and external ear normal.     Nose: No nasal deformity or rhinorrhea.  Eyes:     General: No scleral icterus.    Conjunctiva/sclera: Conjunctivae normal.     Right eye: Right conjunctiva is not injected.     Left eye: Left conjunctiva is not injected.     Pupils: Pupils are equal, round, and reactive to light.  Neck:     Musculoskeletal: Normal range of motion and neck supple.     Vascular: No JVD.  Cardiovascular:     Rate and Rhythm: Normal rate and regular rhythm.     Heart sounds: Normal heart sounds, S1 normal and S2 normal. No murmur. No friction rub.  Abdominal:     General: Bowel sounds are normal. There is no distension.     Palpations: Abdomen is soft.     Tenderness: There is no abdominal tenderness.  Musculoskeletal: Normal range of motion.     Right shoulder: Normal.     Left shoulder: Normal.     Right hip: Normal.     Left hip: Normal.     Right knee: Normal.     Left knee: Normal.  Lymphadenopathy:     Head:     Right side of head: No submandibular, preauricular or posterior  auricular adenopathy.     Left side of head: No submandibular, preauricular or posterior auricular adenopathy.     Cervical: No cervical adenopathy.     Right cervical: No superficial or deep cervical adenopathy.    Left cervical: No superficial or deep cervical adenopathy.  Skin:    General: Skin is warm and dry.     Coloration: Skin is not pale.     Findings: No abrasion, bruising, ecchymosis, erythema, lesion or rash.     Nails: There is no clubbing.   Neurological:     Mental Status: He is alert and oriented to person, place, and time.     Sensory: No sensory deficit.     Coordination: Coordination normal.     Gait: Gait normal.  Psychiatric:        Attention and Perception: He is attentive.        Mood and Affect: Mood is anxious.        Speech: Speech is rapid and pressured.        Behavior: Behavior normal. Behavior is cooperative.        Thought Content: Thought content normal.        Judgment: Judgment normal.           Assessment & Plan:  HIV disease continue Biktarvy, checking labs today and he can return to clinic in 1 years time  Bipolar disorder follow-up with his primary care physician and psychiatrist Dental problems I: Ambulatory dental clinic here.   Opioid dependence on buprenorphine through [facility].    Marland Kitchen

## 2019-08-27 NOTE — ED Notes (Signed)
Called x1 w/ no response.

## 2019-08-29 LAB — CYTOLOGY, (ORAL, ANAL, URETHRAL) ANCILLARY ONLY
Chlamydia: NEGATIVE
Chlamydia: NEGATIVE
Neisseria Gonorrhea: NEGATIVE
Neisseria Gonorrhea: NEGATIVE

## 2019-08-29 LAB — HELPER T-LYMPH-CD4 (ARMC ONLY)
% CD 4 Pos. Lymph.: 37.8 % (ref 30.8–58.5)
Absolute CD 4 Helper: 1096 /uL (ref 359–1519)
Basophils Absolute: 0 10*3/uL (ref 0.0–0.2)
Basos: 1 %
EOS (ABSOLUTE): 0.2 10*3/uL (ref 0.0–0.4)
Eos: 2 %
Hematocrit: 44.8 % (ref 37.5–51.0)
Hemoglobin: 15.1 g/dL (ref 13.0–17.7)
Immature Grans (Abs): 0 10*3/uL (ref 0.0–0.1)
Immature Granulocytes: 0 %
Lymphocytes Absolute: 2.9 10*3/uL (ref 0.7–3.1)
Lymphs: 38 %
MCH: 31.8 pg (ref 26.6–33.0)
MCHC: 33.7 g/dL (ref 31.5–35.7)
MCV: 94 fL (ref 79–97)
Monocytes Absolute: 0.4 10*3/uL (ref 0.1–0.9)
Monocytes: 5 %
Neutrophils Absolute: 4.2 10*3/uL (ref 1.4–7.0)
Neutrophils: 54 %
Platelets: 239 10*3/uL (ref 150–450)
RBC: 4.75 x10E6/uL (ref 4.14–5.80)
RDW: 13.1 % (ref 11.6–15.4)
WBC: 7.7 10*3/uL (ref 3.4–10.8)

## 2019-08-29 LAB — URINE CYTOLOGY ANCILLARY ONLY
Chlamydia: NEGATIVE
Neisseria Gonorrhea: NEGATIVE

## 2019-08-31 LAB — COMPLETE METABOLIC PANEL WITH GFR
AG Ratio: 1.8 (calc) (ref 1.0–2.5)
ALT: 11 U/L (ref 9–46)
AST: 22 U/L (ref 10–40)
Albumin: 4.3 g/dL (ref 3.6–5.1)
Alkaline phosphatase (APISO): 50 U/L (ref 36–130)
BUN: 12 mg/dL (ref 7–25)
CO2: 26 mmol/L (ref 20–32)
Calcium: 9.4 mg/dL (ref 8.6–10.3)
Chloride: 109 mmol/L (ref 98–110)
Creat: 0.9 mg/dL (ref 0.60–1.35)
GFR, Est African American: 125 mL/min/{1.73_m2} (ref >=60)
GFR, Est Non African American: 108 mL/min/{1.73_m2} (ref >=60)
Globulin: 2.4 g/dL (calc) (ref 1.9–3.7)
Glucose, Bld: 90 mg/dL (ref 65–99)
Potassium: 4.2 mmol/L (ref 3.5–5.3)
Sodium: 141 mmol/L (ref 135–146)
Total Bilirubin: 0.3 mg/dL (ref 0.2–1.2)
Total Protein: 6.7 g/dL (ref 6.1–8.1)

## 2019-08-31 LAB — CBC WITH DIFFERENTIAL/PLATELET
Absolute Monocytes: 370 cells/uL (ref 200–950)
Basophils Absolute: 59 cells/uL (ref 0–200)
Basophils Relative: 0.8 %
Eosinophils Absolute: 148 cells/uL (ref 15–500)
Eosinophils Relative: 2 %
HCT: 44.4 % (ref 38.5–50.0)
Hemoglobin: 15 g/dL (ref 13.2–17.1)
Lymphs Abs: 2812 cells/uL (ref 850–3900)
MCH: 31.3 pg (ref 27.0–33.0)
MCHC: 33.8 g/dL (ref 32.0–36.0)
MCV: 92.7 fL (ref 80.0–100.0)
MPV: 10.4 fL (ref 7.5–12.5)
Monocytes Relative: 5 %
Neutro Abs: 4011 cells/uL (ref 1500–7800)
Neutrophils Relative %: 54.2 %
Platelets: 247 10*3/uL (ref 140–400)
RBC: 4.79 10*6/uL (ref 4.20–5.80)
RDW: 12.9 % (ref 11.0–15.0)
Total Lymphocyte: 38 %
WBC: 7.4 10*3/uL (ref 3.8–10.8)

## 2019-08-31 LAB — RPR TITER: RPR Titer: 1:2 {titer} — ABNORMAL HIGH

## 2019-08-31 LAB — FLUORESCENT TREPONEMAL AB(FTA)-IGG-BLD: Fluorescent Treponemal ABS: REACTIVE — AB

## 2019-08-31 LAB — RPR: RPR Ser Ql: REACTIVE — AB

## 2019-08-31 LAB — HIV-1 RNA QUANT-NO REFLEX-BLD
HIV 1 RNA Quant: <20 copies/mL
HIV-1 RNA Quant, Log: <1.3 Log copies/mL

## 2019-09-26 DIAGNOSIS — M5090 Cervical disc disorder, unspecified, unspecified cervical region: Secondary | ICD-10-CM | POA: Insufficient documentation

## 2019-10-27 ENCOUNTER — Telehealth: Payer: Self-pay

## 2019-10-27 NOTE — Telephone Encounter (Signed)
Voicemail: Patient left message to call .  Returned called , voicemail message left to call our office.    Laverle Patter, RN

## 2019-12-15 ENCOUNTER — Other Ambulatory Visit: Payer: Self-pay | Admitting: *Deleted

## 2019-12-15 NOTE — Progress Notes (Signed)
Received updated medication list from [facility]. [name], RN

## 2019-12-17 DIAGNOSIS — M489 Spondylopathy, unspecified: Secondary | ICD-10-CM | POA: Insufficient documentation

## 2019-12-22 ENCOUNTER — Ambulatory Visit: Payer: Medicare Other | Admitting: Infectious Disease

## 2019-12-24 ENCOUNTER — Ambulatory Visit (INDEPENDENT_AMBULATORY_CARE_PROVIDER_SITE_OTHER): Payer: Medicare Other | Admitting: Infectious Disease

## 2019-12-24 ENCOUNTER — Other Ambulatory Visit: Payer: Self-pay

## 2019-12-24 ENCOUNTER — Encounter: Payer: Self-pay | Admitting: Infectious Disease

## 2019-12-24 DIAGNOSIS — B20 Human immunodeficiency virus [HIV] disease: Secondary | ICD-10-CM | POA: Diagnosis not present

## 2019-12-24 DIAGNOSIS — G43109 Migraine with aura, not intractable, without status migrainosus: Secondary | ICD-10-CM | POA: Diagnosis not present

## 2019-12-24 DIAGNOSIS — F901 Attention-deficit hyperactivity disorder, predominantly hyperactive type: Secondary | ICD-10-CM

## 2019-12-24 DIAGNOSIS — F1190 Opioid use, unspecified, uncomplicated: Secondary | ICD-10-CM

## 2019-12-24 DIAGNOSIS — Z8619 Personal history of other infectious and parasitic diseases: Secondary | ICD-10-CM

## 2019-12-24 DIAGNOSIS — F1199 Opioid use, unspecified with unspecified opioid-induced disorder: Secondary | ICD-10-CM

## 2019-12-24 DIAGNOSIS — M489 Spondylopathy, unspecified: Secondary | ICD-10-CM
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
## 2020-02-10 ENCOUNTER — Other Ambulatory Visit: Payer: Medicare Other

## 2020-02-17 ENCOUNTER — Other Ambulatory Visit: Payer: Self-pay

## 2020-02-17 ENCOUNTER — Other Ambulatory Visit: Payer: Medicare Other

## 2020-02-17 DIAGNOSIS — Z8619 Personal history of other infectious and parasitic diseases: Secondary | ICD-10-CM

## 2020-02-17 DIAGNOSIS — B20 Human immunodeficiency virus [HIV] disease: Secondary | ICD-10-CM

## 2020-02-17 DIAGNOSIS — Z79899 Other long term (current) drug therapy: Secondary | ICD-10-CM

## 2020-02-17 DIAGNOSIS — F901 Attention-deficit hyperactivity disorder, predominantly hyperactive type: Secondary | ICD-10-CM

## 2020-02-18 LAB — T-HELPER CELL (CD4) - (RCID CLINIC ONLY)
CD4 % Helper T Cell: 35 % (ref 33–65)
CD4 T Cell Abs: 710 /uL (ref 400–1790)

## 2020-02-20 LAB — COMPLETE METABOLIC PANEL WITH GFR
AG Ratio: 1.6 (calc) (ref 1.0–2.5)
ALT: 16 U/L (ref 9–46)
AST: 28 U/L (ref 10–40)
Albumin: 4.2 g/dL (ref 3.6–5.1)
Alkaline phosphatase (APISO): 50 U/L (ref 36–130)
BUN: 10 mg/dL (ref 7–25)
CO2: 30 mmol/L (ref 20–32)
Calcium: 9.5 mg/dL (ref 8.6–10.3)
Chloride: 103 mmol/L (ref 98–110)
Creat: 1.05 mg/dL (ref 0.60–1.35)
GFR, Est African American: 104 mL/min/{1.73_m2} (ref >=60)
GFR, Est Non African American: 90 mL/min/{1.73_m2} (ref >=60)
Globulin: 2.6 g/dL (calc) (ref 1.9–3.7)
Glucose, Bld: 88 mg/dL (ref 65–99)
Potassium: 4.7 mmol/L (ref 3.5–5.3)
Sodium: 139 mmol/L (ref 135–146)
Total Bilirubin: 0.4 mg/dL (ref 0.2–1.2)
Total Protein: 6.8 g/dL (ref 6.1–8.1)

## 2020-02-20 LAB — LIPID PANEL
Cholesterol: 185 mg/dL (ref <200)
HDL: 39 mg/dL — ABNORMAL LOW (ref >=40)
LDL Cholesterol (Calc): 130 mg/dL (calc) — ABNORMAL HIGH
Non-HDL Cholesterol (Calc): 146 mg/dL (calc) — ABNORMAL HIGH (ref <130)
Total CHOL/HDL Ratio: 4.7 (calc) (ref <5.0)
Triglycerides: 65 mg/dL (ref <150)

## 2020-02-20 LAB — CBC WITH DIFFERENTIAL/PLATELET
Absolute Monocytes: 524 cells/uL (ref 200–950)
Basophils Absolute: 54 cells/uL (ref 0–200)
Basophils Relative: 0.7 %
Eosinophils Absolute: 54 cells/uL (ref 15–500)
Eosinophils Relative: 0.7 %
HCT: 41.9 % (ref 38.5–50.0)
Hemoglobin: 14.1 g/dL (ref 13.2–17.1)
Lymphs Abs: 1964 cells/uL (ref 850–3900)
MCH: 30.6 pg (ref 27.0–33.0)
MCHC: 33.7 g/dL (ref 32.0–36.0)
MCV: 90.9 fL (ref 80.0–100.0)
MPV: 10.8 fL (ref 7.5–12.5)
Monocytes Relative: 6.8 %
Neutro Abs: 5105 cells/uL (ref 1500–7800)
Neutrophils Relative %: 66.3 %
Platelets: 306 10*3/uL (ref 140–400)
RBC: 4.61 10*6/uL (ref 4.20–5.80)
RDW: 12.3 % (ref 11.0–15.0)
Total Lymphocyte: 25.5 %
WBC: 7.7 10*3/uL (ref 3.8–10.8)

## 2020-02-20 LAB — HIV-1 RNA QUANT-NO REFLEX-BLD
HIV 1 RNA Quant: <20 copies/mL
HIV-1 RNA Quant, Log: <1.3 Log copies/mL

## 2020-02-20 LAB — RPR: RPR Ser Ql: NONREACTIVE

## 2020-03-01 ENCOUNTER — Encounter: Payer: Self-pay | Admitting: Infectious Disease

## 2020-03-01 ENCOUNTER — Ambulatory Visit (INDEPENDENT_AMBULATORY_CARE_PROVIDER_SITE_OTHER): Payer: Medicare Other | Admitting: Infectious Disease

## 2020-03-01 ENCOUNTER — Other Ambulatory Visit: Payer: Self-pay

## 2020-03-01 VITALS — Ht 70.0 in | Wt 174.0 lb

## 2020-03-01 DIAGNOSIS — R112 Nausea with vomiting, unspecified: Secondary | ICD-10-CM

## 2020-03-01 DIAGNOSIS — F1120 Opioid dependence, uncomplicated: Secondary | ICD-10-CM

## 2020-03-01 DIAGNOSIS — F901 Attention-deficit hyperactivity disorder, predominantly hyperactive type: Secondary | ICD-10-CM | POA: Diagnosis not present

## 2020-03-01 DIAGNOSIS — Z8619 Personal history of other infectious and parasitic diseases: Secondary | ICD-10-CM

## 2020-03-01 DIAGNOSIS — B20 Human immunodeficiency virus [HIV] disease: Secondary | ICD-10-CM

## 2020-03-01 DIAGNOSIS — F17210 Nicotine dependence, cigarettes, uncomplicated: Secondary | ICD-10-CM

## 2020-03-01 DIAGNOSIS — G43109 Migraine with aura, not intractable, without status migrainosus: Secondary | ICD-10-CM

## 2020-03-01 DIAGNOSIS — M489 Spondylopathy, unspecified: Secondary | ICD-10-CM

## 2020-03-01 DIAGNOSIS — R7989 Other specified abnormal findings of blood chemistry: Secondary | ICD-10-CM | POA: Insufficient documentation

## 2020-03-01 DIAGNOSIS — R630 Anorexia: Secondary | ICD-10-CM

## 2020-03-01 DIAGNOSIS — R238 Other skin changes: Secondary | ICD-10-CM

## 2020-03-01 HISTORY — DX: Other specified abnormal findings of blood chemistry: R79.89

## 2020-03-01 HISTORY — DX: Anorexia: R63.0

## 2020-03-01 MED ORDER — BICTEGRAVIR-EMTRICITAB-TENOFOV 50-200-25 MG PO TABS: 1.0000 | ORAL_TABLET | Freq: Every day | ORAL | 11 refills | Status: DC

## 2020-03-01 MED ORDER — DRONABINOL 5 MG PO CAPS: 5.0000 mg | ORAL_CAPSULE | Freq: Two times a day (BID) | ORAL | 2 refills | Status: DC

## 2020-03-01 MED ORDER — ONDANSETRON 8 MG PO TBDP: ORAL_TABLET | ORAL | 5 refills | Status: DC

## 2020-03-01 NOTE — Progress Notes (Signed)
Subjective:  Chief complaint:     Patient ID: Randy Castillo, male    DOB: 1981/07/25, 39 y.o.   MRN: [id number]  HPI  39 year-old Caucasian man living with HIV that is been well controlled on Biktarvy.    He had hepatitis C which has been cured.  He does have chronic opiate dependence and is on opiate replacement therapy.  He has a history of smoking marijuana and cigarettes.  Marijuana has stopped and he is trying to stop cigarettes he is cut down to about 5 cigarettes/day  He has had neurosurgery with improvement in much of his neuropathic pain  He is [facility].  He is telling me he still having trouble with anorexia and wanted if he could have some to help him with appetite and with the nausea he had been on dissolvable Zofran for nausea in the past he specifically made requests for synthetic marijuana    He denies fevers nausea or other systemic symptoms. Past Medical History:  Diagnosis Date  . Anxiety   . Asthma   . Bipolar affective disorder, manic, in partial or remission (HCC)   . Environmental allergies   . Gonorrhea in male   . Hepatitis C virus infection resolved after antiviral drug therapy 05/03/2016  . Herpes   . HIV disease (HCC)   . Hypertension   . Migraine   . Neuropathic pain   . Opioid dependence on agonist therapy (HCC)   . PTSD (post-traumatic stress disorder)   . Recurrent sinus infections 02/04/2019  . Seizures (HCC)     Past Surgical History:  Procedure Laterality Date  . CYST EXCISION    . ESOPHAGOGASTRODUODENOSCOPY  2008   Dr. Darrick Penna: Erosive reflux esophagitis, gastritis, small bowel biopsy negative for celiac, gastric biopsy negative for H. pylori    Family History  Family history unknown: Yes      Social History   Socioeconomic History  . Marital status: Significant Other    Spouse name: Not on file  . Number of children: 1  . Years of education: Not on file  . Highest  education level: Not on file  Occupational History  . Not on file  Tobacco Use  . Smoking status: Current Every Day Smoker    Packs/day: 1.00    Years: 16.00    Pack years: 16.00    Types: Cigarettes    Start date: 12/11/1994  . Smokeless tobacco: Never Used  . Tobacco comment: Is vaping to help reduce cigarette use  Substance and Sexual Activity  . Alcohol use: No    Alcohol/week: 0.0 standard drinks  . Drug use: Not Currently    Types: Marijuana    Comment: drugs in mid-20s. intranasal/iv  . Sexual activity: Not Currently    Partners: Female, Male    Birth control/protection: Condom    Comment: pt. declined condoms  Other Topics Concern  . Not on file  Social History Narrative  . Not on file   Social Determinants of Health   Financial Resource Strain:   . Difficulty of Paying Living Expenses:   Food Insecurity:   . Worried About Programme researcher, broadcasting/film/video in the Last Year:   . Barista in the Last Year:   Transportation Needs:   . Freight forwarder (Medical):   Marland Kitchen Lack of Transportation (Non-Medical):   Physical Activity:   . Days of Exercise per Week:   . Minutes of Exercise per  Session:   Stress:   . Feeling of Stress :   Social Connections:   . Frequency of Communication with Friends and Family:   . Frequency of Social Gatherings with Friends and Family:   . Attends Religious Services:   . Active Member of Clubs or Organizations:   . Attends Banker Meetings:   Marland Kitchen Marital Status:     Allergies  Allergen Reactions  . Zanaflex [Tizanidine Hcl]     Seizure  . Diclofenac Sodium Hives    Interaction with HIV medications   . Naloxone Other (See Comments)    headache  . Propoxyphene Nausea And Vomiting  . Darvocet [Propoxyphene N-Acetaminophen] Nausea And Vomiting     Current Outpatient Medications:  .  albuterol (PROVENTIL HFA;VENTOLIN HFA) 108 (90 Base) MCG/ACT inhaler, Inhale 2 puffs into the lungs every 6 (six) hours as needed for  wheezing., Disp: 1 Inhaler, Rfl: 3 .  alprazolam (XANAX) 2 MG tablet, Take 0.5-1 tablets (1-2 mg total) by mouth 3 (three) times daily as needed for sleep or anxiety., Disp: 10 tablet, Rfl: 0 .  amphetamine-dextroamphetamine (ADDERALL) 20 MG tablet, Take 1 tablet (20 mg total) by mouth 4 (four) times daily., Disp: 12 tablet, Rfl: 0 .  bictegravir-emtricitabine-tenofovir AF (BIKTARVY) 50-200-25 MG TABS tablet, Take 1 tablet by mouth daily. Try to take at the same time each day with or without food., Disp: 30 tablet, Rfl: 11 .  buprenorphine (SUBUTEX) 8 MG SUBL SL tablet, Place under the tongue., Disp: , Rfl:  .  COMBIVENT RESPIMAT 20-100 MCG/ACT AERS respimat, Inhale 1 puff into the lungs every 6 (six) hours as needed., Disp: 1 Inhaler, Rfl: 0 .  diclofenac Sodium (VOLTAREN) 1 % GEL, , Disp: , Rfl:  .  fluticasone (FLONASE) 50 MCG/ACT nasal spray, USE 2 SPRAYS IEN D, Disp: 16 g, Rfl: 0 .  gabapentin (NEURONTIN) 300 MG capsule, , Disp: , Rfl:  .  LATUDA 120 MG TABS, TK 1 T PO  QD, Disp: 3 tablet, Rfl: 0 .  meloxicam (MOBIC) 15 MG tablet, , Disp: , Rfl:  .  montelukast (SINGULAIR) 10 MG tablet, TK 1 T PO QHS, Disp: 3 tablet, Rfl: 9 .  naloxone (NARCAN) nasal spray 4 mg/0.1 mL, Place into the nose., Disp: , Rfl:  .  NICOTINE STEP 1 21 MG/24HR patch, Place 1 patch (21 mg total) onto the skin daily., Disp: 3 patch, Rfl: 0 .  ondansetron (ZOFRAN) 4 MG tablet, SMARTSIG:1 Tablet(s) By Mouth Every 8 Hours PRN, Disp: , Rfl:  .  ondansetron (ZOFRAN-ODT) 8 MG disintegrating tablet, DISSOLVE 1 TABLET BY MOUTH EVERY 8 HOURS AS NEEDED FOR NAUSEA AND VOMITING, Disp: 30 tablet, Rfl: 0 .  pantoprazole (PROTONIX) 40 MG tablet, TAKE 1 TABLET BY MOUTH EVERY DAY, Disp: , Rfl:  .  predniSONE (STERAPRED UNI-PAK 21 TAB) 10 MG (21) TBPK tablet, , Disp: , Rfl:  .  QUEtiapine (SEROQUEL) 200 MG tablet, Take 1 tablet (200 mg total) by mouth at bedtime., Disp: 30 tablet, Rfl: 0 .  RESTASIS 0.05 % ophthalmic emulsion, , Disp: ,  Rfl:  .  rizatriptan (MAXALT) 10 MG tablet, , Disp: , Rfl:  .  sodium fluoride (SF 5000 PLUS) 1.1 % CREA dental cream, BRUSH ON TEETH WITH A TOOTHBRUSH AFTER EVENING MOUTH CARE. SPIT OUT EXCESS DO NOT RINSE, Disp: , Rfl:  .  topiramate (TOPAMAX) 25 MG capsule, Take 2 capsules (50 mg total) by mouth 2 (two) times daily., Disp: 120 capsule, Rfl: 0 .  traZODone (DESYREL) 50 MG tablet, , Disp: , Rfl:  .  valACYclovir (VALTREX) 1000 MG tablet, Take 1 tablet (1,000 mg total) by mouth daily. *MAY TAKE ONE TABLET IN ADDITION AS NEEDED, Disp: 12 tablet, Rfl: 0  Review of Systems  Constitutional: Positive for appetite change. Negative for chills and fever.  HENT: Negative for congestion, sinus pressure, sinus pain and sore throat.   Eyes: Negative for photophobia.  Respiratory: Negative for cough, shortness of breath and wheezing.   Cardiovascular: Negative for chest pain, palpitations and leg swelling.  Gastrointestinal: Negative for abdominal pain, blood in stool, constipation, diarrhea, nausea and vomiting.  Genitourinary: Negative for dysuria, flank pain and hematuria.  Musculoskeletal: Negative for back pain and myalgias.  Skin: Negative for rash.  Neurological: Negative for dizziness, weakness and headaches.  Hematological: Does not bruise/bleed easily.  Psychiatric/Behavioral: Negative for behavioral problems, confusion, decreased concentration and suicidal ideas.       Objective:   Physical Exam Constitutional:      General: He is not in acute distress.    Appearance: Normal appearance. He is well-developed. He is not ill-appearing or diaphoretic.  HENT:     Head: Normocephalic and atraumatic.     Right Ear: Hearing and external ear normal.     Left Ear: Hearing and external ear normal.     Nose: No nasal deformity or rhinorrhea.  Eyes:     General: No scleral icterus.    Conjunctiva/sclera: Conjunctivae normal.     Right eye: Right conjunctiva is not injected.     Left eye: Left  conjunctiva is not injected.     Pupils: Pupils are equal, round, and reactive to light.  Neck:     Vascular: No JVD.  Cardiovascular:     Rate and Rhythm: Normal rate and regular rhythm.     Heart sounds: Normal heart sounds, S1 normal and S2 normal. No murmur. No friction rub.  Abdominal:     General: Bowel sounds are normal. There is no distension.     Palpations: Abdomen is soft.     Tenderness: There is no abdominal tenderness.  Musculoskeletal:        General: Normal range of motion.     Right shoulder: Normal.     Left shoulder: Normal.     Cervical back: Normal range of motion and neck supple.     Right hip: Normal.     Left hip: Normal.     Right knee: Normal.     Left knee: Normal.  Lymphadenopathy:     Head:     Right side of head: No submandibular, preauricular or posterior auricular adenopathy.     Left side of head: No submandibular, preauricular or posterior auricular adenopathy.     Cervical: No cervical adenopathy.     Right cervical: No superficial or deep cervical adenopathy.    Left cervical: No superficial or deep cervical adenopathy.  Skin:    General: Skin is warm and dry.     Coloration: Skin is not pale.     Findings: No abrasion, bruising, ecchymosis, erythema, lesion or rash.     Nails: There is no clubbing.  Neurological:     Mental Status: He is alert and oriented to person, place, and time.     Sensory: No sensory deficit.     Coordination: Coordination normal.     Gait: Gait normal.  Psychiatric:        Attention and Perception: He is attentive.  Mood and Affect: Mood is anxious.        Speech: Speech normal.        Behavior: Behavior normal. Behavior is cooperative.        Thought Content: Thought content normal.        Cognition and Memory: Cognition and memory normal.        Judgment: Judgment normal.           Assessment & Plan:  HIV disease continue Biktarvy,   Bipolar disorder follow-up with his primary care physician  and psychiatrist is being tapered off of one of his medications   Opioid dependence on opiate replacement  Vertebral spine disease status post neurosurgery  Anorexia: I will prescribe him Marinol.  Should help with appetite and potentially with his nausea we also prescribe Zofran  Smoking he is continue to work on smoking cessation with nicotine patches  He did not like trying vaping to stop cigarettes and felt he was getting too much nicotine that way  Low testosterone: On testosterone replacement through [facility]      .

## 2020-03-02 ENCOUNTER — Telehealth: Payer: Self-pay

## 2020-03-02 NOTE — Telephone Encounter (Signed)
Patient called regarding his Marinol script given on yesterday by Dr Daiva Eves , Pharmacy stated he will need prior authorization.    Form received. We will submit .   Laurell Josephs, RN

## 2020-03-02 NOTE — Telephone Encounter (Signed)
Initiated PA for patient over phone through [employer]. PA under clinical review. Decision should be made within 24-48 hours.  Must call back for PA outcome. P: [phone]. PA#: [id number] Lorenso Courier, CMA

## 2020-03-04 ENCOUNTER — Telehealth: Payer: Self-pay

## 2020-03-04 NOTE — Telephone Encounter (Signed)
Prior authorization denied. Patient must try and fail at least one of the following alternative medications: ativan, compazine, decadron, haldol, promethazine, reglan and zyprexa. Will forward to provider for any changes.   Berneice Zettlemoyer Loyola Mast, RN

## 2020-03-04 NOTE — Telephone Encounter (Signed)
Error

## 2020-03-04 NOTE — Telephone Encounter (Signed)
His new Dronabinol prescription. Randy Castillo submitted a prior authorization appeal since he has tried other medications prior to this one.

## 2020-03-04 NOTE — Telephone Encounter (Signed)
Randy Castillo submitting appeal claim based off of previous medication attempts. Provider notified that nothing is needed at this time. Will update as needed.   [name] Loyola Mast, RN

## 2020-03-04 NOTE — Telephone Encounter (Signed)
What is the appeal for?

## 2020-03-09 ENCOUNTER — Telehealth: Payer: Self-pay | Admitting: *Deleted

## 2020-03-09 NOTE — Telephone Encounter (Signed)
Per pharmacy, patient's dronabinol has been approved under Medicare Part B, but it needs to be resent electronically and associated with an ICD10 code.  Pharmacy unable to take a verbal, must be a hard script or escribed by physician. [name], RN

## 2020-03-10 ENCOUNTER — Other Ambulatory Visit: Payer: Self-pay | Admitting: Infectious Disease

## 2020-03-10 ENCOUNTER — Telehealth: Payer: Self-pay | Admitting: Pharmacy Technician

## 2020-03-10 DIAGNOSIS — B20 Human immunodeficiency virus [HIV] disease: Secondary | ICD-10-CM

## 2020-03-10 DIAGNOSIS — R630 Anorexia: Secondary | ICD-10-CM

## 2020-03-10 MED ORDER — DRONABINOL 5 MG PO CAPS: 5.0000 mg | ORAL_CAPSULE | Freq: Two times a day (BID) | ORAL | 2 refills | Status: DC

## 2020-03-10 NOTE — Telephone Encounter (Signed)
Prescription sent

## 2020-03-10 NOTE — Telephone Encounter (Signed)
[facility] Patient Advocate Encounter   Received notification from [facility] that prior authorization for Marinol is required.   PA submitted on 03/10/2020 for appeal Key [id number] Status is pending 72 hours review    [facility] will continue to follow. Patient is aware of the process and delay in being able to get the medication. [facility] was under [facility] impression [facility] could be filled with partB. If rejected again on partD, we will further explore getting his information for partB determination.   Beulah Gandy, CPhT Specialty Pharmacy Patient [facility] Phone: [phone] Fax: [phone] 03/10/2020 1:51 PM

## 2020-03-10 NOTE — Telephone Encounter (Signed)
Patient called office today for update on prescription for Dronabinol. Informed patient that we are waiting on MD to e- scribe prescription since we are unable to call in prescription. Patient verbalized understanding.  Randy Castillo, [place]

## 2020-03-31 NOTE — Telephone Encounter (Signed)
[facility] Patient Advocate Encounter  Received notification from [facility] that the request for prior authorization for Dronabinol has been denied due to not clinical sufficient support to fill under partD.     Medication was filled at pharmacy on 03/13/2020 using partB and diagnosis code and can be filled again on 04/05/2020.  Beulah Gandy, CPhT Specialty Pharmacy Patient [facility] Phone: [phone] Fax: [phone] 03/31/2020 3:05 PM

## 2020-04-14 DIAGNOSIS — M545 Low back pain, unspecified: Secondary | ICD-10-CM | POA: Insufficient documentation

## 2020-04-14 DIAGNOSIS — G8929 Other chronic pain: Secondary | ICD-10-CM | POA: Insufficient documentation

## 2020-05-27 ENCOUNTER — Telehealth: Payer: Self-pay | Admitting: *Deleted

## 2020-05-27 NOTE — Telephone Encounter (Signed)
PA continued for marinol, effective 05/27/20 - 09/12/20. [facility] pa department [phone]. [name], RN

## 2020-06-06 ENCOUNTER — Other Ambulatory Visit: Payer: Self-pay | Admitting: Infectious Disease

## 2020-06-06 DIAGNOSIS — R630 Anorexia: Secondary | ICD-10-CM

## 2020-06-06 DIAGNOSIS — B20 Human immunodeficiency virus [HIV] disease: Secondary | ICD-10-CM

## 2020-06-07 ENCOUNTER — Other Ambulatory Visit: Payer: Self-pay | Admitting: Infectious Disease

## 2020-06-07 DIAGNOSIS — R630 Anorexia: Secondary | ICD-10-CM

## 2020-06-07 DIAGNOSIS — B20 Human immunodeficiency virus [HIV] disease: Secondary | ICD-10-CM

## 2020-06-15 DIAGNOSIS — M5136 Other intervertebral disc degeneration, lumbar region: Secondary | ICD-10-CM | POA: Insufficient documentation

## 2020-07-22 ENCOUNTER — Other Ambulatory Visit: Payer: Self-pay | Admitting: Infectious Disease

## 2020-07-22 DIAGNOSIS — R112 Nausea with vomiting, unspecified: Secondary | ICD-10-CM

## 2020-07-22 DIAGNOSIS — B20 Human immunodeficiency virus [HIV] disease: Secondary | ICD-10-CM

## 2020-08-18 ENCOUNTER — Telehealth: Payer: Self-pay

## 2020-08-18 NOTE — Telephone Encounter (Signed)
Electronic PA initiated for Draonabinol. Sent to plan on 08/18/20 via coverMymeds.  Request Reference Number: [id number]. DRONABINOL CAP 5MG  is approved through 11/17/2020. Your patient may now fill this prescription and it will be covered.  Pharmacy notified.  14/07/2020

## 2020-08-25 ENCOUNTER — Encounter: Payer: Self-pay | Admitting: Infectious Disease

## 2020-09-03 ENCOUNTER — Telehealth: Payer: Self-pay

## 2020-09-03 NOTE — Telephone Encounter (Signed)
Patient called office today stating he tested positive for Gonorrhea and became sepsis. Was prescribed 7 days of Levofloxacin 5 mg, but only received 5 days. Patient does not have a fever, but is still experiencing pain and discharge.  Advised patient to contact Urgent Care provider he saw Sunday to discuss concerns. Advised he be evaluated as soon as he can if symptoms get worse. Patient will contact Urgent Care today.  Was seen at [facility] in [place].  Lorenso Courier, [place]

## 2020-09-04 NOTE — Telephone Encounter (Signed)
Levaquin is not appropriate for GC he needs 500 mg ceftriaxone IM

## 2020-09-06 ENCOUNTER — Other Ambulatory Visit: Payer: Medicare Other

## 2020-09-23 ENCOUNTER — Other Ambulatory Visit: Payer: Medicare Other

## 2020-09-27 ENCOUNTER — Other Ambulatory Visit: Payer: Medicare Other

## 2020-10-04 ENCOUNTER — Other Ambulatory Visit: Payer: Self-pay

## 2020-10-04 ENCOUNTER — Other Ambulatory Visit: Payer: Medicare Other

## 2020-10-04 DIAGNOSIS — B20 Human immunodeficiency virus [HIV] disease: Secondary | ICD-10-CM

## 2020-10-04 DIAGNOSIS — R630 Anorexia: Secondary | ICD-10-CM

## 2020-10-04 MED ORDER — DRONABINOL 5 MG PO CAPS: ORAL_CAPSULE | ORAL | 2 refills | Status: DC

## 2020-10-05 ENCOUNTER — Other Ambulatory Visit: Payer: Medicare Other

## 2020-10-06 ENCOUNTER — Telehealth: Payer: Medicare Other | Admitting: Infectious Disease

## 2020-10-18 ENCOUNTER — Telehealth: Payer: Self-pay

## 2020-10-18 ENCOUNTER — Ambulatory Visit: Payer: Medicare Other | Admitting: Infectious Disease

## 2020-10-18 NOTE — Telephone Encounter (Signed)
Signed marinol prescription in triage pending patient pick up; cancelled follow up appt with [facility] 10/18/2020  Rosanna Randy, RN'

## 2020-10-18 NOTE — Telephone Encounter (Signed)
Attempted to reach patient regarding prescription pick up. Will send patient a MyChart message. Valarie Cones

## 2020-10-19 ENCOUNTER — Encounter: Payer: Self-pay | Admitting: Infectious Disease

## 2020-10-19 ENCOUNTER — Other Ambulatory Visit: Payer: Self-pay

## 2020-10-19 ENCOUNTER — Other Ambulatory Visit (HOSPITAL_COMMUNITY)
Admission: RE | Admit: 2020-10-19 | Discharge: 2020-10-19 | Disposition: A | Discharge status: Home / Self Care | Payer: Medicare Other | Source: Ambulatory Visit | Attending: Infectious Disease | Admitting: Infectious Disease

## 2020-10-19 ENCOUNTER — Ambulatory Visit (INDEPENDENT_AMBULATORY_CARE_PROVIDER_SITE_OTHER): Payer: Medicare Other | Admitting: Infectious Disease

## 2020-10-19 VITALS — BP 135/75 | HR 128 | Wt 157.0 lb

## 2020-10-19 DIAGNOSIS — R112 Nausea with vomiting, unspecified: Secondary | ICD-10-CM

## 2020-10-19 DIAGNOSIS — F3110 Bipolar disorder, current episode manic without psychotic features, unspecified: Secondary | ICD-10-CM

## 2020-10-19 DIAGNOSIS — Z23 Encounter for immunization: Secondary | ICD-10-CM | POA: Diagnosis not present

## 2020-10-19 DIAGNOSIS — F1120 Opioid dependence, uncomplicated: Secondary | ICD-10-CM

## 2020-10-19 DIAGNOSIS — B20 Human immunodeficiency virus [HIV] disease: Secondary | ICD-10-CM

## 2020-10-19 DIAGNOSIS — Z8619 Personal history of other infectious and parasitic diseases: Secondary | ICD-10-CM | POA: Diagnosis not present

## 2020-10-19 DIAGNOSIS — F411 Generalized anxiety disorder: Secondary | ICD-10-CM

## 2020-10-19 MED ORDER — VALACYCLOVIR HCL 1 G PO TABS: 1000.0000 mg | ORAL_TABLET | Freq: Every day | ORAL | 0 refills | Status: DC

## 2020-10-19 MED ORDER — ONDANSETRON 8 MG PO TBDP: 8.0000 mg | ORAL_TABLET | Freq: Four times a day (QID) | ORAL | 5 refills | Status: DC | PRN

## 2020-10-19 MED ORDER — BICTEGRAVIR-EMTRICITAB-TENOFOV 50-200-25 MG PO TABS: 1.0000 | ORAL_TABLET | Freq: Every day | ORAL | 11 refills | Status: DC

## 2020-10-19 NOTE — Progress Notes (Signed)
Subjective:  Chief complaint: recent prostatitis and UTI,urinary retention     Patient ID: Randy Castillo, male    DOB: 20-Mar-1981, 39 y.o.   MRN: [id number]  HPI  39year-old Caucasian man living with HIV that is been well controlled on Biktarvy.    He had hepatitis C which has been cured.  He does have chronic opiate dependence and is on opiate replacement therap with Kip Corrington at [facility].    He has had neurosurgery with improvement in much of his neuropathic pain but now apparently had problem w what sounds like urinary retention requiring placement of foley catheter, complicated by UTI, prostatitis on 45 days of antibiotics and followed by [facility].  [facility]    Past Medical History:  Diagnosis Date  . Anorexia 03/01/2020  . Anxiety   . Asthma   . Bipolar affective disorder, manic, in partial or remission   . Environmental allergies   . Gonorrhea in male   . Hepatitis C virus infection resolved after antiviral drug therapy 05/03/2016  . Herpes   . HIV disease (HCC)   . Hypertension   . Low testosterone 03/01/2020  . Migraine   . Neuropathic pain   . Opioid dependence on agonist therapy (HCC)   . PTSD (post-traumatic stress disorder)   . Recurrent sinus infections 02/04/2019  . Seizures (HCC)     Past Surgical History:  Procedure Laterality Date  . CYST EXCISION    . ESOPHAGOGASTRODUODENOSCOPY  2008   Dr. Darrick Penna: Erosive reflux esophagitis, gastritis, small bowel biopsy negative for celiac, gastric biopsy negative for H. pylori    Family History  Family history unknown: Yes      Social History   Socioeconomic History  . Marital status: Significant Other    Spouse name: Not on file  . Number of children: 1  . Years of education: Not on file  . Highest education level: Not on file  Occupational History  . Not on file  Tobacco Use  . Smoking status: Current Every Day Smoker     Packs/day: 1.00    Years: 16.00    Pack years: 16.00    Types: Cigarettes    Start date: 12/11/1994  . Smokeless tobacco: Never Used  . Tobacco comment: Is vaping to help reduce cigarette use  Vaping Use  . Vaping Use: Never used  Substance and Sexual Activity  . Alcohol use: No    Alcohol/week: 0.0 standard drinks  . Drug use: Not Currently    Types: Marijuana    Comment: drugs in mid-20s. intranasal/iv  . Sexual activity: Not Currently    Partners: Female, Male    Birth control/protection: Condom    Comment: pt. declined condoms  Other Topics Concern  . Not on file  Social History Narrative  . Not on file   Social Determinants of Health   Financial Resource Strain:   . Difficulty of Paying Living Expenses: Not on file  Food Insecurity:   . Worried About Programme researcher, broadcasting/film/video in the Last Year: Not on file  . Ran Out of Food in the Last Year: Not on file  Transportation Needs:   . Lack of Transportation (Medical): Not on file  . Lack of Transportation (Non-Medical): Not on file  Physical Activity:   . Days of Exercise per Week: Not on file  . Minutes of Exercise per Session: Not on file  Stress:   . Feeling of  Stress : Not on file  Social Connections:   . Frequency of Communication with Friends and Family: Not on file  . Frequency of Social Gatherings with Friends and Family: Not on file  . Attends Religious Services: Not on file  . Active Member of Clubs or Organizations: Not on file  . Attends Banker Meetings: Not on file  . Marital Status: Not on file    Allergies  Allergen Reactions  . Zanaflex [Tizanidine Hcl]     Seizure  . Diclofenac Sodium Hives    Interaction with HIV medications   . Naloxone Other (See Comments)    headache  . Propoxyphene Nausea And Vomiting  . Darvocet [Propoxyphene N-Acetaminophen] Nausea And Vomiting     Current Outpatient Medications:  .  ALPRAZolam (XANAX) 1 MG tablet, Take 2 mg by mouth 3 (three) times daily.,  Disp: , Rfl:  .  amphetamine-dextroamphetamine (ADDERALL) 20 MG tablet, Take 1 tablet (20 mg total) by mouth 4 (four) times daily., Disp: 12 tablet, Rfl: 0 .  Ascorbic Acid (VITAMIN C PO), Take by mouth., Disp: , Rfl:  .  bictegravir-emtricitabine-tenofovir AF (BIKTARVY) 50-200-25 MG TABS tablet, Take 1 tablet by mouth daily. Try to take at the same time each day with or without food., Disp: 30 tablet, Rfl: 11 .  buprenorphine (SUBUTEX) 8 MG SUBL SL tablet, Place under the tongue., Disp: , Rfl:  .  COMBIVENT RESPIMAT 20-100 MCG/ACT AERS respimat, Inhale 1 puff into the lungs every 6 (six) hours as needed., Disp: 1 Inhaler, Rfl: 0 .  diclofenac Sodium (VOLTAREN) 1 % GEL, , Disp: , Rfl:  .  dronabinol (MARINOL) 5 MG capsule, TAKE 1 CAPSULE BY MOUTH TWICE A DAY (BEFORE LUNCH & SUPPER), Disp: 60 capsule, Rfl: 2 .  fluticasone (FLONASE) 50 MCG/ACT nasal spray, USE 2 SPRAYS IEN D, Disp: 16 g, Rfl: 0 .  LATUDA 120 MG TABS, TK 1 T PO  QD, Disp: 3 tablet, Rfl: 0 .  montelukast (SINGULAIR) 10 MG tablet, TK 1 T PO QHS, Disp: 3 tablet, Rfl: 9 .  NICOTINE STEP 1 21 MG/24HR patch, Place 1 patch (21 mg total) onto the skin daily., Disp: 3 patch, Rfl: 0 .  ondansetron (ZOFRAN-ODT) 8 MG disintegrating tablet, DISSOLVE 1 TABLET BY MOUTH EVERY 8 HOURS AS NEEDED FOR NAUSEA AND VOMITING, Disp: 30 tablet, Rfl: 5 .  pantoprazole (PROTONIX) 40 MG tablet, TAKE 1 TABLET BY MOUTH EVERY DAY, Disp: , Rfl:  .  RESTASIS 0.05 % ophthalmic emulsion, , Disp: , Rfl:  .  rizatriptan (MAXALT) 10 MG tablet, , Disp: , Rfl:  .  sodium fluoride (SF 5000 PLUS) 1.1 % CREA dental cream, BRUSH ON TEETH WITH A TOOTHBRUSH AFTER EVENING MOUTH CARE. SPIT OUT EXCESS DO NOT RINSE, Disp: , Rfl:  .  testosterone cypionate (DEPOTESTOSTERONE CYPIONATE) 200 MG/ML injection, INJECT 0.8CC S ONCE WEEKLY AS DIRECTED, Disp: , Rfl:  .  valACYclovir (VALTREX) 1000 MG tablet, Take 1 tablet (1,000 mg total) by mouth daily. *MAY TAKE ONE TABLET IN ADDITION AS  NEEDED, Disp: 12 tablet, Rfl: 0 .  zolpidem (AMBIEN) 10 MG tablet, Take by mouth., Disp: , Rfl:   Review of Systems  Constitutional: Negative for chills and fever.  HENT: Negative for congestion, sinus pressure, sinus pain and sore throat.   Eyes: Negative for photophobia.  Respiratory: Negative for cough, shortness of breath and wheezing.   Cardiovascular: Negative for chest pain, palpitations and leg swelling.  Gastrointestinal: Negative for abdominal pain, blood  in stool, constipation, diarrhea, nausea and vomiting.  Genitourinary: Negative for dysuria, flank pain and hematuria.  Musculoskeletal: Negative for back pain and myalgias.  Skin: Negative for rash.  Neurological: Negative for dizziness, weakness and headaches.  Hematological: Does not bruise/bleed easily.  Psychiatric/Behavioral: Negative for behavioral problems, confusion, decreased concentration and suicidal ideas. The patient is nervous/anxious.        Objective:   Physical Exam Constitutional:      General: He is not in acute distress.    Appearance: Normal appearance. He is well-developed. He is not ill-appearing or diaphoretic.  HENT:     Head: Normocephalic and atraumatic.     Right Ear: Hearing and external ear normal.     Left Ear: Hearing and external ear normal.     Nose: No nasal deformity or rhinorrhea.  Eyes:     General: No scleral icterus.    Conjunctiva/sclera: Conjunctivae normal.     Right eye: Right conjunctiva is not injected.     Left eye: Left conjunctiva is not injected.     Pupils: Pupils are equal, round, and reactive to light.  Neck:     Vascular: No JVD.  Cardiovascular:     Rate and Rhythm: Normal rate and regular rhythm.     Heart sounds: S1 normal and S2 normal.  Pulmonary:     Effort: Pulmonary effort is normal. No respiratory distress.  Abdominal:     General: Bowel sounds are normal. There is no distension.     Palpations: Abdomen is soft.  Musculoskeletal:        General:  Normal range of motion.     Right shoulder: Normal.     Left shoulder: Normal.     Cervical back: Normal range of motion and neck supple.     Right hip: Normal.     Left hip: Normal.     Right knee: Normal.     Left knee: Normal.  Lymphadenopathy:     Head:     Right side of head: No submandibular, preauricular or posterior auricular adenopathy.     Left side of head: No submandibular, preauricular or posterior auricular adenopathy.     Cervical: No cervical adenopathy.     Right cervical: No superficial or deep cervical adenopathy.    Left cervical: No superficial or deep cervical adenopathy.  Skin:    General: Skin is warm and dry.     Coloration: Skin is not pale.     Findings: No abrasion, bruising, ecchymosis, erythema, lesion or rash.     Nails: There is no clubbing.  Neurological:     General: No focal deficit present.     Mental Status: He is alert and oriented to person, place, and time.     Sensory: No sensory deficit.     Coordination: Coordination normal.     Gait: Gait normal.  Psychiatric:        Attention and Perception: He is attentive.        Mood and Affect: Mood is anxious.        Speech: Speech is rapid and pressured.        Behavior: Behavior normal. Behavior is cooperative.        Thought Content: Thought content normal.        Cognition and Memory: Cognition and memory normal.        Judgment: Judgment normal.           Assessment & Plan:  HIV disease continue Biktarvy,  Bipolar disorder follow-up with his primary care physician and psychiatrist is being tapered off of one of his medications   Opioid dependence on opiate replacement  Vertebral spine disease status post neurosurgery  Anorexia:Marinol. rx govem/   Should help with appetite and potentially with his nausea we also prescribe Zofran  UTI"s after foley catheter and prostatitis following with [facility]. I do not have direct access to their records or if he was hospitalized which he  seems to indicate he was not  Low testosterone: On testosterone replacement through [facility]      .

## 2020-10-20 LAB — T-HELPER CELL (CD4) - (RCID CLINIC ONLY)
CD4 % Helper T Cell: 38 % (ref 33–65)
CD4 T Cell Abs: 1122 /uL (ref 400–1790)

## 2020-10-20 LAB — URINE CYTOLOGY ANCILLARY ONLY
Chlamydia: NEGATIVE
Comment: NEGATIVE
Comment: NORMAL
Neisseria Gonorrhea: NEGATIVE

## 2020-10-25 LAB — CBC WITH DIFFERENTIAL/PLATELET
Absolute Monocytes: 537 cells/uL (ref 200–950)
Basophils Absolute: 36 cells/uL (ref 0–200)
Basophils Relative: 0.4 %
Eosinophils Absolute: 164 cells/uL (ref 15–500)
Eosinophils Relative: 1.8 %
HCT: 48.2 % (ref 38.5–50.0)
Hemoglobin: 16.5 g/dL (ref 13.2–17.1)
Lymphs Abs: 3240 cells/uL (ref 850–3900)
MCH: 30.8 pg (ref 27.0–33.0)
MCHC: 34.2 g/dL (ref 32.0–36.0)
MCV: 89.9 fL (ref 80.0–100.0)
MPV: 10.6 fL (ref 7.5–12.5)
Monocytes Relative: 5.9 %
Neutro Abs: 5123 cells/uL (ref 1500–7800)
Neutrophils Relative %: 56.3 %
Platelets: 279 10*3/uL (ref 140–400)
RBC: 5.36 10*6/uL (ref 4.20–5.80)
RDW: 14.4 % (ref 11.0–15.0)
Total Lymphocyte: 35.6 %
WBC: 9.1 10*3/uL (ref 3.8–10.8)

## 2020-10-25 LAB — COMPLETE METABOLIC PANEL WITH GFR
AG Ratio: 1.5 (calc) (ref 1.0–2.5)
ALT: 14 U/L (ref 9–46)
AST: 21 U/L (ref 10–40)
Albumin: 4.3 g/dL (ref 3.6–5.1)
Alkaline phosphatase (APISO): 54 U/L (ref 36–130)
BUN: 15 mg/dL (ref 7–25)
CO2: 28 mmol/L (ref 20–32)
Calcium: 9.7 mg/dL (ref 8.6–10.3)
Chloride: 105 mmol/L (ref 98–110)
Creat: 1.07 mg/dL (ref 0.60–1.35)
GFR, Est African American: 101 mL/min/{1.73_m2} (ref >=60)
GFR, Est Non African American: 87 mL/min/{1.73_m2} (ref >=60)
Globulin: 2.8 g/dL (calc) (ref 1.9–3.7)
Glucose, Bld: 94 mg/dL (ref 65–99)
Potassium: 4.1 mmol/L (ref 3.5–5.3)
Sodium: 139 mmol/L (ref 135–146)
Total Bilirubin: 0.5 mg/dL (ref 0.2–1.2)
Total Protein: 7.1 g/dL (ref 6.1–8.1)

## 2020-10-25 LAB — LIPID PANEL
Cholesterol: 188 mg/dL (ref <200)
HDL: 40 mg/dL (ref >=40)
LDL Cholesterol (Calc): 130 mg/dL (calc) — ABNORMAL HIGH
Non-HDL Cholesterol (Calc): 148 mg/dL (calc) — ABNORMAL HIGH (ref <130)
Total CHOL/HDL Ratio: 4.7 (calc) (ref <5.0)
Triglycerides: 81 mg/dL (ref <150)

## 2020-10-25 LAB — HIV-1 RNA QUANT-NO REFLEX-BLD
HIV 1 RNA Quant: <20 Copies/mL — ABNORMAL HIGH
HIV-1 RNA Quant, Log: <1.3 Log cps/mL — ABNORMAL HIGH

## 2020-10-25 LAB — FLUORESCENT TREPONEMAL AB(FTA)-IGG-BLD: Fluorescent Treponemal ABS: REACTIVE — AB

## 2020-10-25 LAB — RPR TITER: RPR Titer: 1:1 {titer} — ABNORMAL HIGH

## 2020-10-25 LAB — RPR: RPR Ser Ql: REACTIVE — AB

## 2020-11-03 ENCOUNTER — Telehealth: Payer: Self-pay

## 2020-11-03 NOTE — Telephone Encounter (Signed)
PA for dronabinol has been done over the phone through covermymeds for the patient. Medication has been approved through 02/16/21. PA reference# [id number]. PA approval has also been faxed to the patient's pharmacy ([facility])  Randy Castillo T Pricilla Loveless

## 2020-11-05 DIAGNOSIS — F5104 Psychophysiologic insomnia: Secondary | ICD-10-CM | POA: Insufficient documentation

## 2021-01-27 ENCOUNTER — Telehealth: Payer: Self-pay

## 2021-01-27 NOTE — Telephone Encounter (Signed)
Initiated PA via cover my meds for  Dronabinol as previous PA will expire on 3/9 [id number] - PA Case ID: [id number] Status pending.  Will continue to follow.  Valarie Cones

## 2021-01-28 NOTE — Telephone Encounter (Signed)
Medication approved trough 04/26/21. Pharmacy made aware.  Randy Castillo

## 2021-02-16 ENCOUNTER — Other Ambulatory Visit: Payer: Self-pay | Admitting: Infectious Disease

## 2021-02-16 DIAGNOSIS — R630 Anorexia: Secondary | ICD-10-CM

## 2021-02-16 DIAGNOSIS — B20 Human immunodeficiency virus [HIV] disease: Secondary | ICD-10-CM

## 2021-02-16 DIAGNOSIS — R112 Nausea with vomiting, unspecified: Secondary | ICD-10-CM

## 2021-03-02 ENCOUNTER — Ambulatory Visit: Payer: Medicare Other | Admitting: Infectious Disease

## 2021-03-07 ENCOUNTER — Other Ambulatory Visit: Payer: Self-pay

## 2021-03-07 ENCOUNTER — Other Ambulatory Visit (HOSPITAL_COMMUNITY)
Admission: RE | Admit: 2021-03-07 | Discharge: 2021-03-07 | Disposition: A | Discharge status: Home / Self Care | Payer: Medicare Other | Source: Ambulatory Visit | Attending: Infectious Disease | Admitting: Infectious Disease

## 2021-03-07 ENCOUNTER — Encounter: Payer: Self-pay | Admitting: Infectious Disease

## 2021-03-07 ENCOUNTER — Ambulatory Visit (INDEPENDENT_AMBULATORY_CARE_PROVIDER_SITE_OTHER): Payer: Medicare Other | Admitting: Infectious Disease

## 2021-03-07 VITALS — BP 140/83 | HR 91 | Temp 98.3°F | Wt 154.0 lb

## 2021-03-07 DIAGNOSIS — R7989 Other specified abnormal findings of blood chemistry: Secondary | ICD-10-CM

## 2021-03-07 DIAGNOSIS — B20 Human immunodeficiency virus [HIV] disease: Secondary | ICD-10-CM | POA: Insufficient documentation

## 2021-03-07 DIAGNOSIS — F901 Attention-deficit hyperactivity disorder, predominantly hyperactive type: Secondary | ICD-10-CM

## 2021-03-07 DIAGNOSIS — R630 Anorexia: Secondary | ICD-10-CM

## 2021-03-07 DIAGNOSIS — R112 Nausea with vomiting, unspecified: Secondary | ICD-10-CM

## 2021-03-07 DIAGNOSIS — M489 Spondylopathy, unspecified: Secondary | ICD-10-CM

## 2021-03-07 MED ORDER — ONDANSETRON 8 MG PO TBDP: 8.0000 mg | ORAL_TABLET | Freq: Four times a day (QID) | ORAL | 5 refills | Status: AC | PRN

## 2021-03-07 MED ORDER — DRONABINOL 5 MG PO CAPS: ORAL_CAPSULE | ORAL | 1 refills | Status: DC

## 2021-03-07 MED ORDER — BICTEGRAVIR-EMTRICITAB-TENOFOV 50-200-25 MG PO TABS: 1.0000 | ORAL_TABLET | Freq: Every day | ORAL | 11 refills | Status: DC

## 2021-03-07 NOTE — Progress Notes (Signed)
Subjective:  Chief complaint: Follow-up for HIV disease on medications still suffering from some nausea     Patient ID: Randy Castillo, male    DOB: [birth year], 40 y.o.   MRN: [id number]  HPI  40 year-old Caucasian man living with HIV that is been well controlled on Biktarvy.    He had hepatitis C which has been cured.  He does have chronic opiate dependence and is on opiate replacement therap with Kip Corrington at [facility].  [facility]  On Zofran and Marinol to help with nausea and appetite.  He continues to maintain high levels of adherence to his antiretroviral medication.  He had questions about Cabenuva but would not be interested if any slightest chance of not staying undetectable.  Past Medical History:  Diagnosis Date  . Anorexia 03/01/2020  . Anxiety   . Asthma   . Bipolar affective disorder, manic, in partial or remission   . Environmental allergies   . Gonorrhea in male   . Hepatitis C virus infection resolved after antiviral drug therapy 05/03/2016  . Herpes   . HIV disease (HCC)   . Hypertension   . Low testosterone 03/01/2020  . Migraine   . Neuropathic pain   . Opioid dependence on agonist therapy (HCC)   . PTSD (post-traumatic stress disorder)   . Recurrent sinus infections 02/04/2019  . Seizures (HCC)     Past Surgical History:  Procedure Laterality Date  . CYST EXCISION    . ESOPHAGOGASTRODUODENOSCOPY  2008   Dr. Darrick Penna: Erosive reflux esophagitis, gastritis, small bowel biopsy negative for celiac, gastric biopsy negative for H. pylori    Family History  Family history unknown: Yes      Social History   Socioeconomic History  . Marital status: Significant Other    Spouse name: Not on file  . Number of children: 1  . Years of education: Not on file  . Highest education level: Not on file  Occupational History  . Not on file  Tobacco Use  . Smoking status: Current Every Day  Smoker    Packs/day: 1.00    Years: 16.00    Pack years: 16.00    Types: Cigarettes    Start date: 12/11/1994  . Smokeless tobacco: Never Used  . Tobacco comment: Is vaping to help reduce cigarette use  Vaping Use  . Vaping Use: Never used  Substance and Sexual Activity  . Alcohol use: No    Alcohol/week: 0.0 standard drinks  . Drug use: Not Currently    Types: Marijuana    Comment: drugs in mid-20s. intranasal/iv  . Sexual activity: Not Currently    Partners: Female, Male    Birth control/protection: Condom    Comment: pt. declined condoms  Other Topics Concern  . Not on file  Social History Narrative  . Not on file   Social Determinants of Health   Financial Resource Strain: Not on file  Food Insecurity: Not on file  Transportation Needs: Not on file  Physical Activity: Not on file  Stress: Not on file  Social Connections: Not on file    Allergies  Allergen Reactions  . Zanaflex [Tizanidine Hcl]     Seizure  . Diclofenac Sodium Hives    Interaction with HIV medications   . Naloxone Other (See Comments)    headache  . Propoxyphene Nausea And Vomiting  . Darvocet [Propoxyphene N-Acetaminophen] Nausea And Vomiting     Current Outpatient Medications:  .  ALPRAZolam (XANAX) 1 MG tablet, Take 2 mg by mouth 3 (three) times daily., Disp: , Rfl:  .  amphetamine-dextroamphetamine (ADDERALL) 20 MG tablet, Take 1 tablet (20 mg total) by mouth 4 (four) times daily., Disp: 12 tablet, Rfl: 0 .  Ascorbic Acid (VITAMIN C PO), Take by mouth., Disp: , Rfl:  .  bictegravir-emtricitabine-tenofovir AF (BIKTARVY) 50-200-25 MG TABS tablet, Take 1 tablet by mouth daily. Try to take at the same time each day with or without food., Disp: 30 tablet, Rfl: 11 .  buprenorphine (SUBUTEX) 8 MG SUBL SL tablet, Place under the tongue., Disp: , Rfl:  .  COMBIVENT RESPIMAT 20-100 MCG/ACT AERS respimat, Inhale 1 puff into the lungs every 6 (six) hours as needed., Disp: 1 Inhaler, Rfl: 0 .   diclofenac Sodium (VOLTAREN) 1 % GEL, , Disp: , Rfl:  .  dronabinol (MARINOL) 5 MG capsule, TAKE 1 TABLET BY MOUTH TWICE A DAY BEFORE LUNCH AND SUPPER, Disp: 60 capsule, Rfl: 1 .  fluticasone (FLONASE) 50 MCG/ACT nasal spray, USE 2 SPRAYS IEN D, Disp: 16 g, Rfl: 0 .  LATUDA 120 MG TABS, TK 1 T PO  QD, Disp: 3 tablet, Rfl: 0 .  montelukast (SINGULAIR) 10 MG tablet, TK 1 T PO QHS, Disp: 3 tablet, Rfl: 9 .  NICOTINE STEP 1 21 MG/24HR patch, Place 1 patch (21 mg total) onto the skin daily., Disp: 3 patch, Rfl: 0 .  ondansetron (ZOFRAN-ODT) 8 MG disintegrating tablet, TAKE 1 TABLET (8 MG TOTAL) BY MOUTH 4 (FOUR) TIMES DAILY AS NEEDED FOR NAUSEA OR VOMITING., Disp: 30 tablet, Rfl: 5 .  pantoprazole (PROTONIX) 40 MG tablet, TAKE 1 TABLET BY MOUTH EVERY DAY, Disp: , Rfl:  .  RESTASIS 0.05 % ophthalmic emulsion, , Disp: , Rfl:  .  rizatriptan (MAXALT) 10 MG tablet, , Disp: , Rfl:  .  sodium fluoride (SF 5000 PLUS) 1.1 % CREA dental cream, BRUSH ON TEETH WITH A TOOTHBRUSH AFTER EVENING MOUTH CARE. SPIT OUT EXCESS DO NOT RINSE, Disp: , Rfl:  .  testosterone cypionate (DEPOTESTOSTERONE CYPIONATE) 200 MG/ML injection, INJECT 0.8CC S ONCE WEEKLY AS DIRECTED, Disp: , Rfl:  .  valACYclovir (VALTREX) 1000 MG tablet, Take 1 tablet (1,000 mg total) by mouth daily. *MAY TAKE ONE TABLET IN ADDITION AS NEEDED, Disp: 12 tablet, Rfl: 0 .  zolpidem (AMBIEN) 10 MG tablet, Take by mouth., Disp: , Rfl:   Review of Systems  Constitutional: Negative for chills and fever.  HENT: Negative for congestion, sinus pressure, sinus pain and sore throat.   Eyes: Negative for photophobia.  Respiratory: Negative for cough, shortness of breath and wheezing.   Cardiovascular: Negative for chest pain, palpitations and leg swelling.  Gastrointestinal: Positive for nausea. Negative for abdominal pain, blood in stool, constipation, diarrhea and vomiting.  Genitourinary: Negative for dysuria, flank pain and hematuria.  Musculoskeletal:  Negative for back pain and myalgias.  Skin: Negative for rash.  Neurological: Negative for dizziness, weakness and headaches.  Hematological: Does not bruise/bleed easily.  Psychiatric/Behavioral: Negative for behavioral problems, confusion, decreased concentration and suicidal ideas.       Objective:   Physical Exam Constitutional:      General: He is not in acute distress.    Appearance: Normal appearance. He is well-developed. He is not ill-appearing or diaphoretic.  HENT:     Head: Normocephalic and atraumatic.     Right Ear: Hearing and external ear normal.     Left Ear: Hearing and external ear normal.  Nose: No nasal deformity or rhinorrhea.  Eyes:     General: No scleral icterus.    Conjunctiva/sclera: Conjunctivae normal.     Right eye: Right conjunctiva is not injected.     Left eye: Left conjunctiva is not injected.     Pupils: Pupils are equal, round, and reactive to light.  Neck:     Vascular: No JVD.  Cardiovascular:     Rate and Rhythm: Normal rate and regular rhythm.     Heart sounds: S1 normal and S2 normal.  Pulmonary:     Effort: Pulmonary effort is normal. No respiratory distress.  Abdominal:     General: Bowel sounds are normal. There is no distension.     Palpations: Abdomen is soft.  Musculoskeletal:        General: Normal range of motion.     Right shoulder: Normal.     Left shoulder: Normal.     Cervical back: Normal range of motion and neck supple.     Right hip: Normal.     Left hip: Normal.     Right knee: Normal.     Left knee: Normal.  Lymphadenopathy:     Head:     Right side of head: No submandibular, preauricular or posterior auricular adenopathy.     Left side of head: No submandibular, preauricular or posterior auricular adenopathy.     Cervical: No cervical adenopathy.     Right cervical: No superficial or deep cervical adenopathy.    Left cervical: No superficial or deep cervical adenopathy.  Skin:    General: Skin is warm and  dry.     Coloration: Skin is not pale.     Findings: No abrasion, bruising, ecchymosis, erythema, lesion or rash.     Nails: There is no clubbing.  Neurological:     General: No focal deficit present.     Mental Status: He is alert and oriented to person, place, and time.     Sensory: No sensory deficit.     Coordination: Coordination normal.     Gait: Gait normal.  Psychiatric:        Attention and Perception: He is attentive.        Mood and Affect: Mood normal.        Behavior: Behavior normal. Behavior is cooperative.        Thought Content: Thought content normal.        Cognition and Memory: Cognition and memory normal.        Judgment: Judgment normal.           Assessment & Plan:  HIV disease: Check labs today and continue BIKTARVY I do not think that Cabenuva would be a good option for him  Bipolar disorder follow-up with his primary care physician and psychiatrist is being tapered off of one of his medications   Opioid dependence on opiate replacement  Vertebral spine disease status post neurosurgery  Anorexia:Marinol.   Nausea: Continue Zofran as well.  GI screening with screening for second STIs the genital and extra genital sites.     Low testosterone: On testosterone replacement through [facility]      .

## 2021-03-08 LAB — CYTOLOGY, (ORAL, ANAL, URETHRAL) ANCILLARY ONLY
Chlamydia: NEGATIVE
Chlamydia: NEGATIVE
Comment: NEGATIVE
Comment: NEGATIVE
Comment: NORMAL
Comment: NORMAL
Neisseria Gonorrhea: NEGATIVE
Neisseria Gonorrhea: NEGATIVE

## 2021-03-08 LAB — URINE CYTOLOGY ANCILLARY ONLY
Chlamydia: NEGATIVE
Comment: NEGATIVE
Comment: NORMAL
Neisseria Gonorrhea: NEGATIVE

## 2021-03-08 LAB — T-HELPER CELL (CD4) - (RCID CLINIC ONLY)
CD4 % Helper T Cell: 36 % (ref 33–65)
CD4 T Cell Abs: 809 /uL (ref 400–1790)

## 2021-03-09 LAB — COMPLETE METABOLIC PANEL WITH GFR
AG Ratio: 1.5 (calc) (ref 1.0–2.5)
ALT: 14 U/L (ref 9–46)
AST: 22 U/L (ref 10–40)
Albumin: 4.4 g/dL (ref 3.6–5.1)
Alkaline phosphatase (APISO): 60 U/L (ref 36–130)
BUN: 13 mg/dL (ref 7–25)
CO2: 28 mmol/L (ref 20–32)
Calcium: 9.5 mg/dL (ref 8.6–10.3)
Chloride: 102 mmol/L (ref 98–110)
Creat: 0.85 mg/dL (ref 0.60–1.35)
GFR, Est African American: 127 mL/min/{1.73_m2} (ref >=60)
GFR, Est Non African American: 110 mL/min/{1.73_m2} (ref >=60)
Globulin: 3 g/dL (calc) (ref 1.9–3.7)
Glucose, Bld: 79 mg/dL (ref 65–99)
Potassium: 4 mmol/L (ref 3.5–5.3)
Sodium: 139 mmol/L (ref 135–146)
Total Bilirubin: 0.4 mg/dL (ref 0.2–1.2)
Total Protein: 7.4 g/dL (ref 6.1–8.1)

## 2021-03-09 LAB — CBC WITH DIFFERENTIAL/PLATELET
Absolute Monocytes: 724 cells/uL (ref 200–950)
Basophils Absolute: 41 cells/uL (ref 0–200)
Basophils Relative: 0.4 %
Eosinophils Absolute: 92 cells/uL (ref 15–500)
Eosinophils Relative: 0.9 %
HCT: 46.1 % (ref 38.5–50.0)
Hemoglobin: 15.4 g/dL (ref 13.2–17.1)
Lymphs Abs: 2183 cells/uL (ref 850–3900)
MCH: 30.9 pg (ref 27.0–33.0)
MCHC: 33.4 g/dL (ref 32.0–36.0)
MCV: 92.4 fL (ref 80.0–100.0)
MPV: 10.1 fL (ref 7.5–12.5)
Monocytes Relative: 7.1 %
Neutro Abs: 7160 cells/uL (ref 1500–7800)
Neutrophils Relative %: 70.2 %
Platelets: 273 10*3/uL (ref 140–400)
RBC: 4.99 10*6/uL (ref 4.20–5.80)
RDW: 13.1 % (ref 11.0–15.0)
Total Lymphocyte: 21.4 %
WBC: 10.2 10*3/uL (ref 3.8–10.8)

## 2021-03-09 LAB — HIV-1 RNA QUANT-NO REFLEX-BLD
HIV 1 RNA Quant: NOT DETECTED Copies/mL
HIV-1 RNA Quant, Log: NOT DETECTED Log cps/mL

## 2021-03-09 LAB — FLUORESCENT TREPONEMAL AB(FTA)-IGG-BLD: Fluorescent Treponemal ABS: REACTIVE — AB

## 2021-03-09 LAB — HEPATITIS B SURFACE ANTIBODY, QUANTITATIVE: Hep B S AB Quant (Post): <5 m[IU]/mL — ABNORMAL LOW (ref >=10)

## 2021-03-09 LAB — RPR TITER: RPR Titer: 1:1 {titer} — ABNORMAL HIGH

## 2021-03-09 LAB — RPR: RPR Ser Ql: REACTIVE — AB

## 2021-03-28 ENCOUNTER — Telehealth: Payer: Self-pay

## 2021-03-28 NOTE — Telephone Encounter (Signed)
PA# [id number] approved for dronabinol 03/28/21-06/27/21. Approval faxed to [facility]'s [facility] ([facility]) [name] Pricilla Loveless

## 2021-04-13 ENCOUNTER — Encounter: Payer: Medicare Other | Admitting: *Deleted

## 2021-04-18 ENCOUNTER — Encounter (INDEPENDENT_AMBULATORY_CARE_PROVIDER_SITE_OTHER): Payer: Self-pay | Admitting: *Deleted

## 2021-04-18 ENCOUNTER — Other Ambulatory Visit: Payer: Self-pay

## 2021-04-18 VITALS — BP 129/78 | HR 100 | Temp 98.4°F | Ht 70.0 in | Wt 150.2 lb

## 2021-04-18 DIAGNOSIS — Z006 Encounter for examination for normal comparison and control in clinical research program: Secondary | ICD-10-CM

## 2021-04-18 NOTE — Research (Signed)
Randy Castillo came in today to screen for the [facility] Study. We reviewed the consent together and he had time to ask questions and validate his understanding of the study before obtaining informed consent.  He denies any new health problems or recently new medications. He has a hx. Of migraines, bipolar, anxiety, add, adhd, allergies, treated Hep C and cervical disc surgery. He was diagnosed in 2015 and started medication a few months later and has remained suppressed. He was vaccinated for hep B in 2016/2017 but has not developed immunity.    We have planned entry for 5/31.

## 2021-04-19 LAB — COMPREHENSIVE METABOLIC PANEL
AG Ratio: 1.7 (calc) (ref 1.0–2.5)
ALT: 12 U/L (ref 9–46)
AST: 22 U/L (ref 10–40)
Albumin: 4.3 g/dL (ref 3.6–5.1)
Alkaline phosphatase (APISO): 53 U/L (ref 36–130)
BUN: 13 mg/dL (ref 7–25)
CO2: 32 mmol/L (ref 20–32)
Calcium: 9.3 mg/dL (ref 8.6–10.3)
Chloride: 101 mmol/L (ref 98–110)
Creat: 1.04 mg/dL (ref 0.60–1.35)
Globulin: 2.6 g/dL (calc) (ref 1.9–3.7)
Glucose, Bld: 73 mg/dL (ref 65–99)
Potassium: 4 mmol/L (ref 3.5–5.3)
Sodium: 138 mmol/L (ref 135–146)
Total Bilirubin: 0.4 mg/dL (ref 0.2–1.2)
Total Protein: 6.9 g/dL (ref 6.1–8.1)

## 2021-04-19 LAB — HEPATIC FUNCTION PANEL
AG Ratio: 1.7 (calc) (ref 1.0–2.5)
ALT: 12 U/L (ref 9–46)
AST: 22 U/L (ref 10–40)
Albumin: 4.3 g/dL (ref 3.6–5.1)
Alkaline phosphatase (APISO): 53 U/L (ref 36–130)
Bilirubin, Direct: 0.1 mg/dL (ref 0.0–0.2)
Globulin: 2.6 g/dL (calc) (ref 1.9–3.7)
Indirect Bilirubin: 0.3 mg/dL (calc) (ref 0.2–1.2)
Total Bilirubin: 0.4 mg/dL (ref 0.2–1.2)
Total Protein: 6.9 g/dL (ref 6.1–8.1)

## 2021-04-19 LAB — HEPATITIS B SURFACE ANTIBODY,QUALITATIVE: Hep B S Ab: NONREACTIVE

## 2021-04-19 LAB — CBC AND DIFFERENTIAL
HCT: 49 (ref 41–53)
Hemoglobin: 16.7 (ref 13.5–17.5)
Neutrophils Absolute: 3.4
Platelets: 256 (ref 150–399)
WBC: 7.2

## 2021-04-19 LAB — CD4/CD8 (T-HELPER/T-SUPPRESSOR CELL)
Absolute CD4: 1090
CD4%: 37.6
CD8 % Suppressor T Cell: 36.6
CD8 T Cell Abs: 1061

## 2021-04-19 LAB — HEMOGLOBIN A1C: Hemoglobin A1C: 5.4

## 2021-04-19 LAB — HEPATITIS B CORE ANTIBODY, TOTAL: Hep B Core Total Ab: NONREACTIVE

## 2021-04-19 LAB — HIV-1/2 AB - DIFFERENTIATION
HIV-1 antibody: POSITIVE — AB
HIV-2 Ab: NEGATIVE

## 2021-04-19 LAB — CK: Total CK: 372 U/L — ABNORMAL HIGH (ref 44–196)

## 2021-04-19 LAB — PROTIME-INR
INR: 1.1
Prothrombin Time: 10.9 s (ref 9.0–11.5)

## 2021-04-19 LAB — CBC: RBC: 5.44 — AB (ref 3.87–5.11)

## 2021-04-19 LAB — PHOSPHORUS: Phosphorus: 3.7 mg/dL (ref 2.5–4.5)

## 2021-04-19 LAB — HIV ANTIBODY (ROUTINE TESTING W REFLEX): HIV 1&2 Ab, 4th Generation: REACTIVE — AB

## 2021-04-19 LAB — HEPATITIS B SURFACE ANTIGEN: Hepatitis B Surface Ag: NONREACTIVE

## 2021-05-11 ENCOUNTER — Encounter: Payer: Medicare Other | Admitting: *Deleted

## 2021-05-12 ENCOUNTER — Other Ambulatory Visit (HOSPITAL_COMMUNITY): Payer: Self-pay

## 2021-05-12 ENCOUNTER — Other Ambulatory Visit: Payer: Self-pay

## 2021-05-12 ENCOUNTER — Encounter (INDEPENDENT_AMBULATORY_CARE_PROVIDER_SITE_OTHER): Payer: Self-pay | Admitting: *Deleted

## 2021-05-12 VITALS — BP 137/87 | HR 119 | Temp 98.6°F | Wt 143.5 lb

## 2021-05-12 DIAGNOSIS — Z006 Encounter for examination for normal comparison and control in clinical research program: Secondary | ICD-10-CM

## 2021-05-12 MED ORDER — STUDY - A5379 - HEPATITIS B VAC RECOMBINANT (ENGERIX-B) 20 MCG/ML IM SUSP (PI-VAN DAM)
INTRAMUSCULAR | 0 refills | Status: DC
  Filled 2021-05-12: qty 1, 1d supply, fill #0

## 2021-05-12 NOTE — Research (Signed)
Randy Castillo enrolled on the A5379 study today after eligibility was reviewed.He denied any new medications, or complaints since the screening visit. He has not had any vaccines lately. He was randomized to receive 3 doses of Engerix and the first dose was given today without any problem. He was instructed in keeping the diary up for the next 7 days and checking his temps every day. He was given a thermometer and a ruler to measure any ISR. He is coming back tomorrow for the optional blood draw on study and then will return in 4 weeks for the next vaccine.

## 2021-05-13 ENCOUNTER — Encounter: Payer: Medicare Other | Admitting: *Deleted

## 2021-05-13 LAB — COMPREHENSIVE METABOLIC PANEL
AG Ratio: 1.7 (calc) (ref 1.0–2.5)
ALT: 13 U/L (ref 9–46)
AST: 17 U/L (ref 10–40)
Albumin: 4.3 g/dL (ref 3.6–5.1)
Alkaline phosphatase (APISO): 53 U/L (ref 36–130)
BUN: 16 mg/dL (ref 7–25)
CO2: 26 mmol/L (ref 20–32)
Calcium: 9.4 mg/dL (ref 8.6–10.3)
Chloride: 104 mmol/L (ref 98–110)
Creat: 0.98 mg/dL (ref 0.60–1.35)
Globulin: 2.5 g/dL (calc) (ref 1.9–3.7)
Glucose, Bld: 104 mg/dL — ABNORMAL HIGH (ref 65–99)
Potassium: 4.5 mmol/L (ref 3.5–5.3)
Sodium: 139 mmol/L (ref 135–146)
Total Bilirubin: 0.3 mg/dL (ref 0.2–1.2)
Total Protein: 6.8 g/dL (ref 6.1–8.1)

## 2021-05-13 LAB — PHOSPHORUS: Phosphorus: 2.6 mg/dL (ref 2.5–4.5)

## 2021-05-13 LAB — CK: Total CK: 196 U/L (ref 44–196)

## 2021-05-13 LAB — BILIRUBIN, DIRECT: Bilirubin, Direct: 0.1 mg/dL (ref 0.0–0.2)

## 2021-05-23 LAB — HIV-1 RNA QUANT-NO REFLEX-BLD
Copies/mL: <40
Log copies/ml (ULTRA): <1.6

## 2021-06-07 ENCOUNTER — Encounter: Payer: Medicare Other | Admitting: *Deleted

## 2021-06-07 ENCOUNTER — Other Ambulatory Visit (HOSPITAL_COMMUNITY): Payer: Self-pay

## 2021-06-07 MED ORDER — STUDY - A5379 - HEPATITIS B VAC RECOMBINANT (ENGERIX-B) 20 MCG/ML IM SUSP (PI-VAN DAM)
INTRAMUSCULAR | 0 refills | Status: DC
  Filled 2021-06-07: qty 1, 1d supply, fill #0

## 2021-06-22 ENCOUNTER — Other Ambulatory Visit (HOSPITAL_COMMUNITY): Payer: Self-pay

## 2021-10-12 ENCOUNTER — Other Ambulatory Visit: Payer: Medicare Other

## 2021-10-14 ENCOUNTER — Other Ambulatory Visit: Payer: Self-pay | Admitting: Infectious Disease

## 2021-10-14 ENCOUNTER — Telehealth: Payer: Self-pay

## 2021-10-14 ENCOUNTER — Other Ambulatory Visit: Payer: Medicare Other

## 2021-10-14 ENCOUNTER — Other Ambulatory Visit: Payer: Self-pay

## 2021-10-14 DIAGNOSIS — R112 Nausea with vomiting, unspecified: Secondary | ICD-10-CM

## 2021-10-14 DIAGNOSIS — B20 Human immunodeficiency virus [HIV] disease: Secondary | ICD-10-CM

## 2021-10-14 DIAGNOSIS — F1120 Opioid dependence, uncomplicated: Secondary | ICD-10-CM

## 2021-10-14 DIAGNOSIS — F901 Attention-deficit hyperactivity disorder, predominantly hyperactive type: Secondary | ICD-10-CM

## 2021-10-14 DIAGNOSIS — F17210 Nicotine dependence, cigarettes, uncomplicated: Secondary | ICD-10-CM

## 2021-10-14 DIAGNOSIS — M489 Spondylopathy, unspecified: Secondary | ICD-10-CM

## 2021-10-14 NOTE — Telephone Encounter (Signed)
Patient was at [facility] for lab appointment and requested to speak with a nurse for help with his son. Explained that his son's mom has been spreading misinformation about his status and health to his son and their community. Threats have been made against him and he wanted an RN to explain his status to his son and explain the difference between HIV and AIDS. RN explained that our office does not work with pediatric patients and due to the trauma he's described, his son would need to be seen by a therapist for assessment and to have this conversation. Randy Castillo stated they've already followed up with Randy Castillo in [facility] and they're working on long term therapy. RN reiterated that would be the best place to start as we're not equipped to manage that care.   RN asked Randy Castillo if he had any questions or concerns and he declined.   Randy Hartsell Loyola Mast, RN

## 2021-10-19 LAB — CBC WITH DIFFERENTIAL/PLATELET
Absolute Monocytes: 403 cells/uL (ref 200–950)
Basophils Absolute: 18 cells/uL (ref 0–200)
Basophils Relative: 0.3 %
Eosinophils Absolute: 104 cells/uL (ref 15–500)
Eosinophils Relative: 1.7 %
HCT: 47.2 % (ref 38.5–50.0)
Hemoglobin: 16.3 g/dL (ref 13.2–17.1)
Lymphs Abs: 2800 cells/uL (ref 850–3900)
MCH: 31.3 pg (ref 27.0–33.0)
MCHC: 34.5 g/dL (ref 32.0–36.0)
MCV: 90.8 fL (ref 80.0–100.0)
MPV: 10 fL (ref 7.5–12.5)
Monocytes Relative: 6.6 %
Neutro Abs: 2776 cells/uL (ref 1500–7800)
Neutrophils Relative %: 45.5 %
Platelets: 259 10*3/uL (ref 140–400)
RBC: 5.2 10*6/uL (ref 4.20–5.80)
RDW: 13 % (ref 11.0–15.0)
Total Lymphocyte: 45.9 %
WBC: 6.1 10*3/uL (ref 3.8–10.8)

## 2021-10-19 LAB — T-HELPER CELLS (CD4) COUNT (NOT AT ARMC)
Absolute CD4: 1073 cells/uL (ref 490–1740)
CD4 T Helper %: 37 % (ref 30–61)
Total lymphocyte count: 2902 cells/uL (ref 850–3900)

## 2021-10-19 LAB — RPR TITER: RPR Titer: 1:1 {titer} — ABNORMAL HIGH

## 2021-10-19 LAB — COMPLETE METABOLIC PANEL WITH GFR
AG Ratio: 1.8 (calc) (ref 1.0–2.5)
ALT: 26 U/L (ref 9–46)
AST: 54 U/L — ABNORMAL HIGH (ref 10–40)
Albumin: 4.5 g/dL (ref 3.6–5.1)
Alkaline phosphatase (APISO): 53 U/L (ref 36–130)
BUN: 17 mg/dL (ref 7–25)
CO2: 32 mmol/L (ref 20–32)
Calcium: 9.8 mg/dL (ref 8.6–10.3)
Chloride: 102 mmol/L (ref 98–110)
Creat: 1.14 mg/dL (ref 0.60–1.29)
Globulin: 2.5 g/dL (calc) (ref 1.9–3.7)
Glucose, Bld: 77 mg/dL (ref 65–99)
Potassium: 4.7 mmol/L (ref 3.5–5.3)
Sodium: 139 mmol/L (ref 135–146)
Total Bilirubin: 0.4 mg/dL (ref 0.2–1.2)
Total Protein: 7 g/dL (ref 6.1–8.1)
eGFR: 83 mL/min/{1.73_m2} (ref >=60)

## 2021-10-19 LAB — LIPID PANEL
Cholesterol: 186 mg/dL (ref <200)
HDL: 46 mg/dL (ref >=40)
LDL Cholesterol (Calc): 125 mg/dL (calc) — ABNORMAL HIGH
Non-HDL Cholesterol (Calc): 140 mg/dL (calc) — ABNORMAL HIGH (ref <130)
Total CHOL/HDL Ratio: 4 (calc) (ref <5.0)
Triglycerides: 56 mg/dL (ref <150)

## 2021-10-19 LAB — HIV-1 RNA QUANT-NO REFLEX-BLD
HIV 1 RNA Quant: NOT DETECTED Copies/mL
HIV-1 RNA Quant, Log: NOT DETECTED Log cps/mL

## 2021-10-19 LAB — RPR: RPR Ser Ql: REACTIVE — AB

## 2021-10-19 LAB — FLUORESCENT TREPONEMAL AB(FTA)-IGG-BLD: Fluorescent Treponemal ABS: REACTIVE — AB

## 2021-10-26 ENCOUNTER — Telehealth: Payer: Self-pay

## 2021-10-26 ENCOUNTER — Encounter: Payer: Medicare Other | Admitting: Infectious Disease

## 2021-10-26 NOTE — Telephone Encounter (Signed)
Called patient to see if he would be able to make it to today's appointment, no answer.    [name], RN

## 2021-11-11 ENCOUNTER — Other Ambulatory Visit: Payer: Self-pay

## 2021-11-11 ENCOUNTER — Other Ambulatory Visit: Payer: Self-pay | Admitting: Infectious Disease

## 2021-11-11 DIAGNOSIS — R630 Anorexia: Secondary | ICD-10-CM

## 2021-11-11 DIAGNOSIS — B20 Human immunodeficiency virus [HIV] disease: Secondary | ICD-10-CM

## 2021-11-11 NOTE — Telephone Encounter (Signed)
Please advise on refill.

## 2021-11-14 ENCOUNTER — Encounter: Payer: Self-pay | Admitting: Infectious Disease

## 2021-11-14 ENCOUNTER — Other Ambulatory Visit: Payer: Self-pay

## 2021-11-14 ENCOUNTER — Ambulatory Visit (INDEPENDENT_AMBULATORY_CARE_PROVIDER_SITE_OTHER): Payer: Medicare Other | Admitting: Infectious Disease

## 2021-11-14 DIAGNOSIS — R972 Elevated prostate specific antigen [PSA]: Secondary | ICD-10-CM

## 2021-11-14 DIAGNOSIS — B20 Human immunodeficiency virus [HIV] disease: Secondary | ICD-10-CM

## 2021-11-14 DIAGNOSIS — R110 Nausea: Secondary | ICD-10-CM

## 2021-11-14 DIAGNOSIS — F3177 Bipolar disorder, in partial remission, most recent episode mixed: Secondary | ICD-10-CM | POA: Diagnosis not present

## 2021-11-14 DIAGNOSIS — R630 Anorexia: Secondary | ICD-10-CM

## 2021-11-14 DIAGNOSIS — R7989 Other specified abnormal findings of blood chemistry: Secondary | ICD-10-CM

## 2021-11-14 HISTORY — DX: Elevated prostate specific antigen (PSA): R97.20

## 2021-11-14 MED ORDER — DRONABINOL 5 MG PO CAPS
5.0000 mg | ORAL_CAPSULE | Freq: Two times a day (BID) | ORAL | 3 refills | Status: DC
Start: 2021-11-14 — End: 2022-04-05

## 2021-11-14 MED ORDER — BICTEGRAVIR-EMTRICITAB-TENOFOV 50-200-25 MG PO TABS: 1.0000 | ORAL_TABLET | Freq: Every day | ORAL | 11 refills | Status: DC

## 2021-11-14 NOTE — Progress Notes (Signed)
Virtual Visit via Telephone Note  I connected with Randy Castillo on 11/14/21 at  2:15 PM EST by telephone and verified that I am speaking with the correct person using two identifiers.  Location: Patient: Home Provider: [facility]   I discussed the limitations, risks, security and privacy concerns of performing an evaluation and management service by telephone and the availability of in person appointments. I also discussed with the patient that there may be a patient responsible charge related to this service. The patient expressed understanding and agreed to proceed.   History of Present Illness:  Randy Castillo is a 40 year old Caucasian man living with HIV that is well controlled who does have bipolar disorder and history of low testosterone on testosterone replacement, opiate dependence on chronic opiate management also with chronic nausea taking Marinol and Zofran.  His HIV remains perfectly controlled on Biktarvy.  He is having worsening nausea which he attributes to worsening anxiety in the context of him having his children staying with him all of the time in him having to be the sole provider.  He also had several other stressors recently.  He asked about his PSA because it was apparently elevated when checked by the physician prescribing his testosterone and he was referred to urology.    Past Medical History:  Diagnosis Date   Anorexia 03/01/2020   Anxiety    Asthma    Bipolar affective disorder, manic, in partial or remission    Environmental allergies    Gonorrhea in male    Hepatitis C virus infection resolved after antiviral drug therapy 05/03/2016   Herpes    HIV disease (HCC)    Hypertension    Low testosterone 03/01/2020   Migraine    Neuropathic pain    Opioid dependence on agonist therapy (HCC)    PTSD (post-traumatic stress disorder)    Recurrent sinus infections 02/04/2019   Seizures (HCC)     Past Surgical History:  Procedure Laterality Date   CYST EXCISION      ESOPHAGOGASTRODUODENOSCOPY  2008   Dr. Darrick Penna: Erosive reflux esophagitis, gastritis, small bowel biopsy negative for celiac, gastric biopsy negative for H. pylori    Family History  Family history unknown: Yes      Social History   Socioeconomic History   Marital status: Significant Other    Spouse name: Not on file   Number of children: 1   Years of education: Not on file   Highest education level: Not on file  Occupational History   Not on file  Tobacco Use   Smoking status: Every Day    Packs/day: 1.00    Years: 16.00    Pack years: 16.00    Types: Cigarettes    Start date: 12/11/1994   Smokeless tobacco: Never   Tobacco comments:    Is vaping to help reduce cigarette use  Vaping Use   Vaping Use: Never used  Substance and Sexual Activity   Alcohol use: No    Alcohol/week: 0.0 standard drinks   Drug use: Not Currently    Types: Marijuana    Comment: drugs in mid-20s. intranasal/iv   Sexual activity: Not Currently    Partners: Female, Male    Birth control/protection: Condom    Comment: pt. declined condoms  Other Topics Concern   Not on file  Social History Narrative   Not on file   Social Determinants of Health   Financial Resource Strain: Not on file  Food Insecurity: Not on file  Transportation Needs: Not  on file  Physical Activity: Not on file  Stress: Not on file  Social Connections: Not on file    Allergies  Allergen Reactions   Zanaflex [Tizanidine Hcl]     Seizure   Diclofenac Sodium Hives    Interaction with HIV medications    Naloxone Other (See Comments)    headache   Propoxyphene Nausea And Vomiting   Darvocet [Propoxyphene N-Acetaminophen] Nausea And Vomiting     Current Outpatient Medications:    ALPRAZolam (XANAX) 1 MG tablet, Take 2 mg by mouth 3 (three) times daily., Disp: , Rfl:    amphetamine-dextroamphetamine (ADDERALL) 20 MG tablet, Take 1 tablet (20 mg total) by mouth 4 (four) times daily., Disp: 12 tablet, Rfl: 0    Ascorbic Acid (VITAMIN C PO), Take by mouth., Disp: , Rfl:    buprenorphine (SUBUTEX) 8 MG SUBL SL tablet, Place under the tongue., Disp: , Rfl:    COMBIVENT RESPIMAT 20-100 MCG/ACT AERS respimat, Inhale 1 puff into the lungs every 6 (six) hours as needed., Disp: 1 Inhaler, Rfl: 0   diclofenac Sodium (VOLTAREN) 1 % GEL, , Disp: , Rfl:    fluticasone (FLONASE) 50 MCG/ACT nasal spray, USE 2 SPRAYS IEN D, Disp: 16 g, Rfl: 0   LATUDA 120 MG TABS, TK 1 T PO  QD, Disp: 3 tablet, Rfl: 0   montelukast (SINGULAIR) 10 MG tablet, TK 1 T PO QHS, Disp: 3 tablet, Rfl: 9   NICOTINE STEP 1 21 MG/24HR patch, Place 1 patch (21 mg total) onto the skin daily., Disp: 3 patch, Rfl: 0   omeprazole (PRILOSEC) 20 MG capsule, Take 20 mg by mouth daily., Disp: , Rfl:    ondansetron (ZOFRAN-ODT) 8 MG disintegrating tablet, Take 1 tablet (8 mg total) by mouth 4 (four) times daily as needed for nausea or vomiting., Disp: 30 tablet, Rfl: 5   pantoprazole (PROTONIX) 40 MG tablet, , Disp: , Rfl:    RESTASIS 0.05 % ophthalmic emulsion, , Disp: , Rfl:    rizatriptan (MAXALT) 10 MG tablet, , Disp: , Rfl:    sodium fluoride (PREVIDENT 5000 PLUS) 1.1 % CREA dental cream, BRUSH ON TEETH WITH A TOOTHBRUSH AFTER EVENING MOUTH CARE. SPIT OUT EXCESS DO NOT RINSE, Disp: , Rfl:    Study - L3734 - hepatitis B recombinant vaccine (ENGERIX-B) IM injection 20 mcg/mL (PI-[place]), Inject in the deltoid muscle.  [id number] E ACTG A5379, Disp: 1 mL, Rfl: 0   testosterone cypionate (DEPOTESTOSTERONE CYPIONATE) 200 MG/ML injection, INJECT 0.8CC S ONCE WEEKLY AS DIRECTED, Disp: , Rfl:    valACYclovir (VALTREX) 1000 MG tablet, Take 1 tablet (1,000 mg total) by mouth daily. *MAY TAKE ONE TABLET IN ADDITION AS NEEDED, Disp: 12 tablet, Rfl: 0   bictegravir-emtricitabine-tenofovir AF (BIKTARVY) 50-200-25 MG TABS tablet, Take 1 tablet by mouth daily. Try to take at the same time each day with or without food., Disp: 30 tablet, Rfl: 11   dronabinol  (MARINOL) 5 MG capsule, Take 1 capsule (5 mg total) by mouth 2 (two) times daily before lunch and supper., Disp: 60 capsule, Rfl: 3   zolpidem (AMBIEN) 10 MG tablet, Take by mouth. (Patient not taking: Reported on 03/07/2021), Disp: , Rfl:   Observations/Objective:  Ronith appear to be in good spirits over the phone and in no acute distress  Assessment and Plan: HIV disease: Reviewed his viral load which was undetectable when checked in November and his CD4 count which  was 809  Lab Results  Component Value Date  CD4TABS 809 03/07/2021   CD4TABS 1,122 10/19/2020   CD4TABS 710 02/17/2020    Elevated PSA: To see urology.  Certainly of concern if this gets worse particular if he is continuing on testosterone.  Bipolar disorder and anxiety: managed by PCP  Low testosterone he is on testosterone replacement through provider  Opiate addiction: Being managed by provider for this.  Nausea: I have renewed his Marinol and Zofran  Follow Up Instructions:    I discussed the assessment and treatment plan with the patient. The patient was provided an opportunity to ask questions and all were answered. The patient agreed with the plan and demonstrated an understanding of the instructions.   The patient was advised to call back or seek an in-person evaluation if the symptoms worsen or if the condition fails to improve as anticipated.  I provided 22 minutes of non-face-to-face time during this encounter.   Acey Lav, MD

## 2021-11-21 ENCOUNTER — Ambulatory Visit: Payer: Medicare Other | Admitting: Infectious Disease

## 2021-11-23 ENCOUNTER — Telehealth: Payer: Self-pay

## 2021-11-23 NOTE — Telephone Encounter (Signed)
PA for dronabinol sent to plan 11/23/21, awaiting response.   [name], RN

## 2021-11-23 NOTE — Telephone Encounter (Signed)
PA for dronabinol approved through 02/21/22. Approval faxed to [facility] at [phone].  [name], RN

## 2022-01-16 ENCOUNTER — Telehealth: Payer: Self-pay

## 2022-01-16 NOTE — Telephone Encounter (Signed)
Patient called to change phone number and have pharmacy changed to [facility], [place] [address]. Patient said he will be out of town for a while , best contact number [phone]

## 2022-02-21 DIAGNOSIS — R6882 Decreased libido: Secondary | ICD-10-CM | POA: Insufficient documentation

## 2022-03-20 ENCOUNTER — Other Ambulatory Visit: Payer: Self-pay

## 2022-03-20 DIAGNOSIS — B20 Human immunodeficiency virus [HIV] disease: Secondary | ICD-10-CM

## 2022-03-20 DIAGNOSIS — R630 Anorexia: Secondary | ICD-10-CM

## 2022-03-20 MED ORDER — BICTEGRAVIR-EMTRICITAB-TENOFOV 50-200-25 MG PO TABS: 1.0000 | ORAL_TABLET | Freq: Every day | ORAL | 5 refills | Status: DC

## 2022-04-04 ENCOUNTER — Telehealth: Payer: Self-pay

## 2022-04-04 NOTE — Telephone Encounter (Signed)
Patient called requesting refill on dronabinol to be sent to [facility] in [place] on [address]. Patient also needing a letter stating that dronabinol will cause positive THC on drug screening.  ? ? ? ?Randy Castillo, CMA ? ?

## 2022-04-05 ENCOUNTER — Other Ambulatory Visit: Payer: Self-pay | Admitting: Infectious Disease

## 2022-04-05 ENCOUNTER — Telehealth: Payer: Self-pay | Admitting: Infectious Disease

## 2022-04-05 ENCOUNTER — Encounter: Payer: Self-pay | Admitting: Infectious Disease

## 2022-04-05 DIAGNOSIS — B20 Human immunodeficiency virus [HIV] disease: Secondary | ICD-10-CM

## 2022-04-05 DIAGNOSIS — R630 Anorexia: Secondary | ICD-10-CM

## 2022-04-05 MED ORDER — DRONABINOL 5 MG PO CAPS: 5.0000 mg | ORAL_CAPSULE | Freq: Two times a day (BID) | ORAL | 3 refills | Status: DC

## 2022-04-05 NOTE — Telephone Encounter (Signed)
Letter written

## 2022-04-05 NOTE — Progress Notes (Signed)
? ? ?  To whom it may concern, ? ?Randy Castillo DOB 03-26-1981 is a patient of mine to whom I prescribe marinol to control his nausea. This drug WILL cause his urine test to screen positive for marijuana ? ?Sincerely, ? ?Lisette Grinder. Daiva Eves, MD ? ? ?

## 2022-04-05 NOTE — Telephone Encounter (Signed)
Left voicemail asking patient to return my call.   Katalina Magri P Younes Degeorge, CMA  

## 2022-04-17 ENCOUNTER — Telehealth: Payer: Self-pay

## 2022-04-17 NOTE — Telephone Encounter (Signed)
PA for dronabinol approved through 07/05/22. Approval faxed to pharmacy. ? ?[id number], [place] - [address]  ?[address], [place] [address]  ?Phone:  [phone]  Fax:  [phone]  ? ?[name], RN ? ?

## 2022-05-12 DIAGNOSIS — K2100 Gastro-esophageal reflux disease with esophagitis, without bleeding: Secondary | ICD-10-CM | POA: Insufficient documentation

## 2022-07-03 ENCOUNTER — Telehealth: Payer: Self-pay

## 2022-07-03 DIAGNOSIS — R630 Anorexia: Secondary | ICD-10-CM

## 2022-07-03 DIAGNOSIS — B20 Human immunodeficiency virus [HIV] disease: Secondary | ICD-10-CM

## 2022-07-03 MED ORDER — DRONABINOL 5 MG PO CAPS: 5.0000 mg | ORAL_CAPSULE | Freq: Two times a day (BID) | ORAL | 3 refills | Status: DC

## 2022-07-03 NOTE — Telephone Encounter (Signed)
Verbal Rx called into [facility] on [address] In [place] per Dr. Daiva Eves.   Prescription repeated back by pharmacist and verified.   [name], RN

## 2022-07-03 NOTE — Telephone Encounter (Signed)
Patient called requesting refill of marinol. Will route to provider.   [name], RN

## 2022-07-03 NOTE — Addendum Note (Signed)
Addended by: Linna Hoff D on: 07/03/2022 11:27 AM   Modules accepted: Orders

## 2022-08-01 ENCOUNTER — Telehealth: Payer: Self-pay

## 2022-08-01 NOTE — Telephone Encounter (Signed)
Patient called requesting refills of ondansetron be sent to [facility] on [address] In [place]. Will route to provider.   [name], RN

## 2022-09-05 ENCOUNTER — Other Ambulatory Visit: Payer: Self-pay

## 2022-09-05 DIAGNOSIS — B20 Human immunodeficiency virus [HIV] disease: Secondary | ICD-10-CM

## 2022-09-05 MED ORDER — BICTEGRAVIR-EMTRICITAB-TENOFOV 50-200-25 MG PO TABS: 1.0000 | ORAL_TABLET | Freq: Every day | ORAL | 5 refills | Status: DC

## 2022-09-27 ENCOUNTER — Other Ambulatory Visit: Payer: Self-pay

## 2022-09-27 ENCOUNTER — Ambulatory Visit (INDEPENDENT_AMBULATORY_CARE_PROVIDER_SITE_OTHER): Payer: Medicare Other

## 2022-09-27 ENCOUNTER — Ambulatory Visit: Payer: Medicare Other

## 2022-09-27 DIAGNOSIS — B20 Human immunodeficiency virus [HIV] disease: Secondary | ICD-10-CM

## 2022-09-27 DIAGNOSIS — F908 Attention-deficit hyperactivity disorder, other type: Secondary | ICD-10-CM

## 2022-09-27 NOTE — Progress Notes (Signed)
Subjective:    Patient ID: Randy Castillo, male    DOB: 1981/07/06, 41 y.o.   MRN: [id number]  HPI  Past Medical History:  Diagnosis Date   Anorexia 03/01/2020   Anxiety    Asthma    Bipolar affective disorder, manic, in partial or remission    Elevated PSA 11/14/2021   Environmental allergies    Gonorrhea in male    Hepatitis C virus infection resolved after antiviral drug therapy 05/03/2016   Herpes    HIV disease (HCC)    Hypertension    Low testosterone 03/01/2020   Migraine    Neuropathic pain    Opioid dependence on agonist therapy (HCC)    PTSD (post-traumatic stress disorder)    Recurrent sinus infections 02/04/2019   Seizures (HCC)     Past Surgical History:  Procedure Laterality Date   CYST EXCISION     ESOPHAGOGASTRODUODENOSCOPY  2008   Dr. Darrick Penna: Erosive reflux esophagitis, gastritis, small bowel biopsy negative for celiac, gastric biopsy negative for H. pylori    Family History  Family history unknown: Yes      Social History   Socioeconomic History   Marital status: Significant Other    Spouse name: Not on file   Number of children: 1   Years of education: Not on file   Highest education level: Not on file  Occupational History   Not on file  Tobacco Use   Smoking status: Every Day    Packs/day: 1.00    Years: 16.00    Total pack years: 16.00    Types: Cigarettes    Start date: 12/11/1994   Smokeless tobacco: Never   Tobacco comments:    Is vaping to help reduce cigarette use  Vaping Use   Vaping Use: Never used  Substance and Sexual Activity   Alcohol use: No    Alcohol/week: 0.0 standard drinks of alcohol   Drug use: Not Currently    Types: Marijuana    Comment: drugs in mid-20s. intranasal/iv   Sexual activity: Not Currently    Partners: Female, Male    Birth control/protection: Condom    Comment: pt. declined condoms  Other Topics Concern   Not on file  Social History Narrative   Not on file   Social Determinants of Health    Financial Resource Strain: Not on file  Food Insecurity: Not on file  Transportation Needs: Not on file  Physical Activity: Not on file  Stress: Not on file  Social Connections: Not on file    Allergies  Allergen Reactions   Zanaflex [Tizanidine Hcl]     Seizure   Diclofenac Sodium Hives    Interaction with HIV medications    Naloxone Other (See Comments)    headache   Propoxyphene Nausea And Vomiting   Darvocet [Propoxyphene N-Acetaminophen] Nausea And Vomiting     Current Outpatient Medications:    ALPRAZolam (XANAX) 1 MG tablet, Take 2 mg by mouth 3 (three) times daily., Disp: , Rfl:    amphetamine-dextroamphetamine (ADDERALL) 20 MG tablet, Take 1 tablet (20 mg total) by mouth 4 (four) times daily., Disp: 12 tablet, Rfl: 0   Ascorbic Acid (VITAMIN C PO), Take by mouth., Disp: , Rfl:    bictegravir-emtricitabine-tenofovir AF (BIKTARVY) 50-200-25 MG TABS tablet, Take 1 tablet by mouth daily. Try to take at the same time each day with or without food., Disp: 30 tablet, Rfl: 5   buprenorphine (SUBUTEX) 8 MG SUBL SL tablet, Place under the tongue., Disp: ,  Rfl:    COMBIVENT RESPIMAT 20-100 MCG/ACT AERS respimat, Inhale 1 puff into the lungs every 6 (six) hours as needed., Disp: 1 Inhaler, Rfl: 0   diclofenac Sodium (VOLTAREN) 1 % GEL, , Disp: , Rfl:    dronabinol (MARINOL) 5 MG capsule, Take 1 capsule (5 mg total) by mouth 2 (two) times daily before lunch and supper., Disp: 60 capsule, Rfl: 3   fluticasone (FLONASE) 50 MCG/ACT nasal spray, USE 2 SPRAYS IEN D, Disp: 16 g, Rfl: 0   LATUDA 120 MG TABS, TK 1 T PO  QD, Disp: 3 tablet, Rfl: 0   montelukast (SINGULAIR) 10 MG tablet, TK 1 T PO QHS, Disp: 3 tablet, Rfl: 9   NICOTINE STEP 1 21 MG/24HR patch, Place 1 patch (21 mg total) onto the skin daily., Disp: 3 patch, Rfl: 0   omeprazole (PRILOSEC) 20 MG capsule, Take 20 mg by mouth daily., Disp: , Rfl:    ondansetron (ZOFRAN-ODT) 8 MG disintegrating tablet, Take 1 tablet (8 mg total)  by mouth 4 (four) times daily as needed for nausea or vomiting., Disp: 30 tablet, Rfl: 5   pantoprazole (PROTONIX) 40 MG tablet, , Disp: , Rfl:    RESTASIS 0.05 % ophthalmic emulsion, , Disp: , Rfl:    rizatriptan (MAXALT) 10 MG tablet, , Disp: , Rfl:    sodium fluoride (PREVIDENT 5000 PLUS) 1.1 % CREA dental cream, BRUSH ON TEETH WITH A TOOTHBRUSH AFTER EVENING MOUTH CARE. SPIT OUT EXCESS DO NOT RINSE, Disp: , Rfl:    Study - Z6109 - hepatitis B recombinant vaccine (ENGERIX-B) IM injection 20 mcg/mL (PI-[place]), Inject in the deltoid muscle.  [id number] H [id number] E ACTG A5379, Disp: 1 mL, Rfl: 0   testosterone cypionate (DEPOTESTOSTERONE CYPIONATE) 200 MG/ML injection, INJECT 0.8CC S ONCE WEEKLY AS DIRECTED, Disp: , Rfl:    valACYclovir (VALTREX) 1000 MG tablet, Take 1 tablet (1,000 mg total) by mouth daily. *MAY TAKE ONE TABLET IN ADDITION AS NEEDED, Disp: 12 tablet, Rfl: 0   zolpidem (AMBIEN) 10 MG tablet, Take by mouth. (Patient not taking: Reported on 03/07/2021), Disp: , Rfl:     Review of Systems     Objective:   Physical Exam        Assessment & Plan:

## 2022-11-07 ENCOUNTER — Other Ambulatory Visit: Payer: Medicare Other

## 2022-11-07 ENCOUNTER — Other Ambulatory Visit: Payer: Self-pay

## 2022-11-07 ENCOUNTER — Other Ambulatory Visit (HOSPITAL_COMMUNITY)
Admission: RE | Admit: 2022-11-07 | Discharge: 2022-11-07 | Disposition: A | Discharge status: Home / Self Care | Payer: Medicare Other | Source: Ambulatory Visit | Attending: Infectious Disease | Admitting: Infectious Disease

## 2022-11-07 ENCOUNTER — Other Ambulatory Visit (HOSPITAL_COMMUNITY): Payer: Self-pay

## 2022-11-07 DIAGNOSIS — Z113 Encounter for screening for infections with a predominantly sexual mode of transmission: Secondary | ICD-10-CM

## 2022-11-07 DIAGNOSIS — B20 Human immunodeficiency virus [HIV] disease: Secondary | ICD-10-CM

## 2022-11-07 DIAGNOSIS — Z79899 Other long term (current) drug therapy: Secondary | ICD-10-CM

## 2022-11-08 LAB — URINE CYTOLOGY ANCILLARY ONLY
Chlamydia: NEGATIVE
Comment: NEGATIVE
Comment: NORMAL
Neisseria Gonorrhea: NEGATIVE

## 2022-11-08 LAB — T-HELPER CELL (CD4) - (RCID CLINIC ONLY)
CD4 % Helper T Cell: 38 % (ref 33–65)
CD4 T Cell Abs: 752 /uL (ref 400–1790)

## 2022-11-09 LAB — COMPLETE METABOLIC PANEL WITH GFR
AG Ratio: 1.7 (calc) (ref 1.0–2.5)
ALT: 48 U/L — ABNORMAL HIGH (ref 9–46)
AST: 32 U/L (ref 10–40)
Albumin: 4.3 g/dL (ref 3.6–5.1)
Alkaline phosphatase (APISO): 62 U/L (ref 36–130)
BUN: 14 mg/dL (ref 7–25)
CO2: 29 mmol/L (ref 20–32)
Calcium: 9.4 mg/dL (ref 8.6–10.3)
Chloride: 104 mmol/L (ref 98–110)
Creat: 0.84 mg/dL (ref 0.60–1.29)
Globulin: 2.6 g/dL (calc) (ref 1.9–3.7)
Glucose, Bld: 114 mg/dL — ABNORMAL HIGH (ref 65–99)
Potassium: 4.3 mmol/L (ref 3.5–5.3)
Sodium: 141 mmol/L (ref 135–146)
Total Bilirubin: 0.2 mg/dL (ref 0.2–1.2)
Total Protein: 6.9 g/dL (ref 6.1–8.1)
eGFR: 112 mL/min/{1.73_m2} (ref >=60)

## 2022-11-09 LAB — CBC WITH DIFFERENTIAL/PLATELET
Absolute Monocytes: 668 cells/uL (ref 200–950)
Basophils Absolute: 36 cells/uL (ref 0–200)
Basophils Relative: 0.4 %
Eosinophils Absolute: 178 cells/uL (ref 15–500)
Eosinophils Relative: 2 %
HCT: 46.8 % (ref 38.5–50.0)
Hemoglobin: 16.1 g/dL (ref 13.2–17.1)
Lymphs Abs: 2403 cells/uL (ref 850–3900)
MCH: 31.6 pg (ref 27.0–33.0)
MCHC: 34.4 g/dL (ref 32.0–36.0)
MCV: 91.8 fL (ref 80.0–100.0)
MPV: 10.4 fL (ref 7.5–12.5)
Monocytes Relative: 7.5 %
Neutro Abs: 5616 cells/uL (ref 1500–7800)
Neutrophils Relative %: 63.1 %
Platelets: 230 10*3/uL (ref 140–400)
RBC: 5.1 10*6/uL (ref 4.20–5.80)
RDW: 12.9 % (ref 11.0–15.0)
Total Lymphocyte: 27 %
WBC: 8.9 10*3/uL (ref 3.8–10.8)

## 2022-11-09 LAB — LIPID PANEL
Cholesterol: 201 mg/dL — ABNORMAL HIGH (ref <200)
HDL: 64 mg/dL (ref >=40)
LDL Cholesterol (Calc): 115 mg/dL (calc) — ABNORMAL HIGH
Non-HDL Cholesterol (Calc): 137 mg/dL (calc) — ABNORMAL HIGH (ref <130)
Total CHOL/HDL Ratio: 3.1 (calc) (ref <5.0)
Triglycerides: 108 mg/dL (ref <150)

## 2022-11-09 LAB — RPR: RPR Ser Ql: NONREACTIVE

## 2022-11-09 LAB — HIV-1 RNA QUANT-NO REFLEX-BLD
HIV 1 RNA Quant: NOT DETECTED Copies/mL
HIV-1 RNA Quant, Log: NOT DETECTED Log cps/mL

## 2022-11-13 ENCOUNTER — Encounter: Payer: Self-pay | Admitting: Infectious Disease

## 2022-11-13 DIAGNOSIS — E785 Hyperlipidemia, unspecified: Secondary | ICD-10-CM

## 2022-11-13 HISTORY — DX: Hyperlipidemia, unspecified: E78.5

## 2022-11-13 NOTE — Progress Notes (Unsigned)
Subjective:  Chief complaint follow-up for HIV disease  Patient ID:   Randy Castillo, male    DOB: Oct 20, 1981, 41 y.o.   MRN: [id number]  HPI  Randy Castillo is a 41 year old Caucasian man living at [facility] view is well-controlled on [place].  He has a history of bipolar disorder also low testosterone.    His history of opiate dependence is on chronic opiate management also along with chronic nausea taking Marinol and Zofran.  He says that 2 new medications that were started for his bipolar disorder have dramatically improved his mood and he is seeing much success with this.  He is no longer on testosterone as this is apparently caused prostatitis previously.  Discussed the results of the reprieve study and he was agreeable to initiating a statin   Past Medical History:  Diagnosis Date   Anorexia 03/01/2020   Anxiety    Asthma    Bipolar affective disorder, manic, in partial or remission    Elevated PSA 11/14/2021   Environmental allergies    Gonorrhea in male    Hepatitis C virus infection resolved after antiviral drug therapy 05/03/2016   Herpes    HIV disease ([place])    Hyperlipidemia 11/13/2022   Hypertension    Low testosterone 03/01/2020   Migraine    Neuropathic pain    Opioid dependence on agonist therapy (HCC)    PTSD (post-traumatic stress disorder)    Recurrent sinus infections 02/04/2019   Seizures ([place])     Past Surgical History:  Procedure Laterality Date   CYST EXCISION     ESOPHAGOGASTRODUODENOSCOPY  2008   Dr. Oneida Alar: Erosive reflux esophagitis, gastritis, small bowel biopsy negative for celiac, gastric biopsy negative for H. pylori    Family History  Family history unknown: Yes      Social History   Socioeconomic History   Marital status: Unknown    Spouse name: Not on file   Number of children: 1   Years of education: Not on file   Highest education level: Not on file  Occupational History   Not on file  Tobacco Use   Smoking status: Every  Day    Packs/day: 1.00    Years: 16.00    Total pack years: 16.00    Types: Cigarettes    Start date: 12/11/1994   Smokeless tobacco: Never   Tobacco comments:    Is vaping to help reduce cigarette use  Vaping Use   Vaping Use: Never used  Substance and Sexual Activity   Alcohol use: No    Alcohol/week: 0.0 standard drinks of alcohol   Drug use: Not Currently    Types: Marijuana    Comment: drugs in mid-20s. intranasal/iv   Sexual activity: Not Currently    Partners: Female, Male    Birth control/protection: Condom    Comment: pt. declined condoms  Other Topics Concern   Not on file  Social History Narrative   Not on file   Social Determinants of Health   Financial Resource Strain: Not on file  Food Insecurity: Not on file  Transportation Needs: Not on file  Physical Activity: Not on file  Stress: Not on file  Social Connections: Not on file    Allergies  Allergen Reactions   Zanaflex [Tizanidine Hcl]     Seizure   Diclofenac Sodium Hives    Interaction with HIV medications    Naloxone Other (See Comments)    headache   Propoxyphene Nausea And Vomiting   Darvocet [Propoxyphene N-Acetaminophen]  Nausea And Vomiting     Current Outpatient Medications:    ALPRAZolam (XANAX) 1 MG tablet, Take 2 mg by mouth 3 (three) times daily., Disp: , Rfl:    amphetamine-dextroamphetamine (ADDERALL) 20 MG tablet, Take 1 tablet (20 mg total) by mouth 4 (four) times daily., Disp: 12 tablet, Rfl: 0   Ascorbic Acid (VITAMIN C PO), Take by mouth., Disp: , Rfl:    bictegravir-emtricitabine-tenofovir AF (BIKTARVY) 50-200-25 MG TABS tablet, Take 1 tablet by mouth daily. Try to take at the same time each day with or without food., Disp: 30 tablet, Rfl: 5   buprenorphine (SUBUTEX) 8 MG SUBL SL tablet, Place under the tongue., Disp: , Rfl:    COMBIVENT RESPIMAT 20-100 MCG/ACT AERS respimat, Inhale 1 puff into the lungs every 6 (six) hours as needed., Disp: 1 Inhaler, Rfl: 0   diclofenac  Sodium (VOLTAREN) 1 % GEL, , Disp: , Rfl:    dronabinol (MARINOL) 5 MG capsule, Take 1 capsule (5 mg total) by mouth 2 (two) times daily before lunch and supper., Disp: 60 capsule, Rfl: 3   fluticasone (FLONASE) 50 MCG/ACT nasal spray, USE 2 SPRAYS IEN D, Disp: 16 g, Rfl: 0   LATUDA 120 MG TABS, TK 1 T PO  QD, Disp: 3 tablet, Rfl: 0   montelukast (SINGULAIR) 10 MG tablet, TK 1 T PO QHS, Disp: 3 tablet, Rfl: 9   NICOTINE STEP 1 21 MG/24HR patch, Place 1 patch (21 mg total) onto the skin daily., Disp: 3 patch, Rfl: 0   omeprazole (PRILOSEC) 20 MG capsule, Take 20 mg by mouth daily., Disp: , Rfl:    ondansetron (ZOFRAN-ODT) 8 MG disintegrating tablet, Take 1 tablet (8 mg total) by mouth 4 (four) times daily as needed for nausea or vomiting., Disp: 30 tablet, Rfl: 5   pantoprazole (PROTONIX) 40 MG tablet, , Disp: , Rfl:    RESTASIS 0.05 % ophthalmic emulsion, , Disp: , Rfl:    rizatriptan (MAXALT) 10 MG tablet, , Disp: , Rfl:    sodium fluoride (PREVIDENT 5000 PLUS) 1.1 % CREA dental cream, BRUSH ON TEETH WITH A TOOTHBRUSH AFTER EVENING MOUTH CARE. SPIT OUT EXCESS DO NOT RINSE, Disp: , Rfl:    Study - Z0258 - hepatitis B recombinant vaccine (ENGERIX-B) IM injection 20 mcg/mL (PI-[place]), Inject in the deltoid muscle.  [id number] E ACTG A5379, Disp: 1 mL, Rfl: 0   testosterone cypionate (DEPOTESTOSTERONE CYPIONATE) 200 MG/ML injection, INJECT 0.8CC S ONCE WEEKLY AS DIRECTED, Disp: , Rfl:    valACYclovir (VALTREX) 1000 MG tablet, Take 1 tablet (1,000 mg total) by mouth daily. *MAY TAKE ONE TABLET IN ADDITION AS NEEDED, Disp: 12 tablet, Rfl: 0   zolpidem (AMBIEN) 10 MG tablet, Take by mouth. (Patient not taking: Reported on 03/07/2021), Disp: , Rfl:    Review of Systems  Constitutional:  Negative for activity change, appetite change, chills, diaphoresis, fatigue, fever and unexpected weight change.  HENT:  Negative for congestion, rhinorrhea, sinus pressure, sneezing, sore throat and trouble  swallowing.   Eyes:  Negative for photophobia and visual disturbance.  Respiratory:  Negative for cough, chest tightness, shortness of breath, wheezing and stridor.   Cardiovascular:  Negative for chest pain, palpitations and leg swelling.  Gastrointestinal:  Positive for nausea. Negative for abdominal distention, abdominal pain, anal bleeding, blood in stool, constipation, diarrhea and vomiting.  Genitourinary:  Negative for difficulty urinating, dysuria, flank pain and hematuria.  Musculoskeletal:  Negative for arthralgias, back pain, gait problem, joint swelling and myalgias.  Skin:  Negative for color change, pallor, rash and wound.  Neurological:  Negative for dizziness, tremors, weakness and light-headedness.  Hematological:  Negative for adenopathy. Does not bruise/bleed easily.  Psychiatric/Behavioral:  Negative for agitation, behavioral problems, confusion, decreased concentration, dysphoric mood and sleep disturbance.        Objective:   Physical Exam Constitutional:      Appearance: He is well-developed.  HENT:     Head: Normocephalic and atraumatic.  Eyes:     Conjunctiva/sclera: Conjunctivae normal.  Cardiovascular:     Rate and Rhythm: Normal rate and regular rhythm.  Pulmonary:     Effort: Pulmonary effort is normal. No respiratory distress.     Breath sounds: No wheezing.  Abdominal:     General: There is no distension.     Palpations: Abdomen is soft.  Musculoskeletal:        General: No tenderness. Normal range of motion.     Cervical back: Normal range of motion and neck supple.  Skin:    General: Skin is warm and dry.     Coloration: Skin is not pale.     Findings: No erythema or rash.  Neurological:     General: No focal deficit present.     Mental Status: He is alert and oriented to person, place, and time.  Psychiatric:        Mood and Affect: Mood normal.        Behavior: Behavior normal.        Thought Content: Thought content normal.         Judgment: Judgment normal.           Assessment & Plan:   HIV disease:  I have reviewed Jasraj H Onofrio's labs including viral load which was  Lab Results  Component Value Date   HIV1RNAQUANT Not Detected 11/07/2022   and cd4 which was  Lab Results  Component Value Date   CD4TABS 752 11/07/2022     I am continuing patient's prescription for Biktarvy  Plus: Nonreactive RPR now  Dependence on chronic opiates through pain management.  Nausea vomiting: Renewed his Marinol: I did review the [place] base he is on a number of sedating medications though most of these are prescribed by [facility].  Hyperlipidemia initiate atorvastatin.  Bipolar disorder doing well on current medications.  Vaccine counseling recommended flu and COVID-19 vaccine

## 2022-11-15 ENCOUNTER — Other Ambulatory Visit: Payer: Self-pay

## 2022-11-15 ENCOUNTER — Encounter: Payer: Self-pay | Admitting: Infectious Disease

## 2022-11-15 ENCOUNTER — Ambulatory Visit (INDEPENDENT_AMBULATORY_CARE_PROVIDER_SITE_OTHER): Payer: Medicare Other | Admitting: Infectious Disease

## 2022-11-15 VITALS — BP 134/92 | HR 90 | Temp 98.5°F | Ht 71.0 in | Wt 149.0 lb

## 2022-11-15 DIAGNOSIS — F3110 Bipolar disorder, current episode manic without psychotic features, unspecified: Secondary | ICD-10-CM

## 2022-11-15 DIAGNOSIS — R630 Anorexia: Secondary | ICD-10-CM

## 2022-11-15 DIAGNOSIS — B20 Human immunodeficiency virus [HIV] disease: Secondary | ICD-10-CM | POA: Diagnosis not present

## 2022-11-15 DIAGNOSIS — E785 Hyperlipidemia, unspecified: Secondary | ICD-10-CM

## 2022-11-15 DIAGNOSIS — N419 Inflammatory disease of prostate, unspecified: Secondary | ICD-10-CM

## 2022-11-15 DIAGNOSIS — R7989 Other specified abnormal findings of blood chemistry: Secondary | ICD-10-CM | POA: Diagnosis not present

## 2022-11-15 DIAGNOSIS — R972 Elevated prostate specific antigen [PSA]: Secondary | ICD-10-CM | POA: Diagnosis not present

## 2022-11-15 MED ORDER — BICTEGRAVIR-EMTRICITAB-TENOFOV 50-200-25 MG PO TABS: 1.0000 | ORAL_TABLET | Freq: Every day | ORAL | 11 refills | Status: DC

## 2022-11-15 MED ORDER — DRONABINOL 5 MG PO CAPS: 5.0000 mg | ORAL_CAPSULE | Freq: Two times a day (BID) | ORAL | 3 refills | Status: DC

## 2022-11-15 MED ORDER — ATORVASTATIN CALCIUM 20 MG PO TABS: 20.0000 mg | ORAL_TABLET | Freq: Every day | ORAL | 11 refills | Status: DC

## 2022-11-22 ENCOUNTER — Telehealth: Payer: Self-pay

## 2022-11-22 NOTE — Telephone Encounter (Signed)
Initiated PA for dronabinol with cover my meds. Waiting for outcome. Philippa Chester, CMA

## 2022-11-22 NOTE — Telephone Encounter (Signed)
PA was approved thru 02-21-23

## 2023-02-23 DIAGNOSIS — K2100 Gastro-esophageal reflux disease with esophagitis, without bleeding: Secondary | ICD-10-CM | POA: Diagnosis not present

## 2023-02-23 DIAGNOSIS — F1120 Opioid dependence, uncomplicated: Secondary | ICD-10-CM | POA: Diagnosis not present

## 2023-02-23 DIAGNOSIS — F1190 Opioid use, unspecified, uncomplicated: Secondary | ICD-10-CM | POA: Diagnosis not present

## 2023-03-05 ENCOUNTER — Other Ambulatory Visit (HOSPITAL_COMMUNITY): Payer: Self-pay

## 2023-03-05 ENCOUNTER — Encounter: Payer: Self-pay | Admitting: Neurology

## 2023-03-05 ENCOUNTER — Ambulatory Visit (INDEPENDENT_AMBULATORY_CARE_PROVIDER_SITE_OTHER): Payer: 59 | Admitting: Neurology

## 2023-03-05 ENCOUNTER — Telehealth: Payer: Self-pay | Admitting: *Deleted

## 2023-03-05 VITALS — BP 146/91 | HR 100 | Ht 70.0 in | Wt 169.0 lb

## 2023-03-05 DIAGNOSIS — G43719 Chronic migraine without aura, intractable, without status migrainosus: Secondary | ICD-10-CM | POA: Diagnosis not present

## 2023-03-05 NOTE — Telephone Encounter (Signed)
Chronic Migraine CPT 64615  Botox J0585 Units:155 units   G43.719 Chronic Migraine without aura,  intractable, without status migrainous   New Botox start for patient

## 2023-03-05 NOTE — Telephone Encounter (Signed)
Benefit Verification [id number] Submitted! Botox One

## 2023-03-05 NOTE — Progress Notes (Signed)
Subjective:    Patient ID: Vontae MAAZ MAGRUDER is a 42 y.o. male.  HPI    Star Age, MD, PhD [facility] [address], [address] [place], [place] [address]  Dear Dr. Dianna Rossetti,   I saw your patient, Garan Dressler, upon your kind request in my neurologic clinic today for initial consultation of his migraine headaches, evaluation for possible Botox injections.  The patient is unaccompanied today.  As you know, Mr. Cirrito is a 42 year old male with an underlying complex medical history of hypertension, hyperlipidemia, low testosterone, anxiety, mood disorder, chronic pain, on chronic narcotic pain medication, PTSD (per chart review), allergies, HIV disease, history of hepatitis C, seizures (per chart review), and migraine headaches, who reports a longstanding history of migraine headaches of about 10 years.  Triggers include not eating on a regular basis or not drinking enough water he admits.  Symptoms are associated with nausea and sometimes vomiting, photophobia, headaches are often in the neck area and bitemporal areas and may last up to 4 days.  He reports that most of the month he has a migraine headache or headache.  He takes over-the-counter ibuprofen, 800 mg as needed and Maxalt has been helpful per PCP prescription and he is also on Aimovig injections per PCP.  He has not yet started Ajovy injections as prescribed by you, he reports that he has not been contacted that the prior authorization has been approved.  He is encouraged to check with his pharmacy.  He is unable to provide a list of medications he has tried and failed for migraine prevention or acute migraine management.  He reports that his PCP has prescribed the Aimovig and Maxalt.  It helps to go to bed and sleep in a dark room, sometimes taking Xanax helps as well.  He drinks caffeine in the form of coffee, 2 cups/day, 1 glass of tea, usually no daily soda, he admits to drinking only 2 to 3 glasses  of water per day.  He does not drink any alcohol.  He is working on smoking cessation and currently smokes about 4 to 5 cigarettes/day. I reviewed your office note from 02/07/2023.  He has been on Aimovig injections as well as Maxalt as needed.  Of note, he is on multiple high risk medications as well as psychotropic medications, he was interested in pursuing Botox injections for his migraines.  He was referred for evaluation of Botox injections for chronic migraines.  He was advised to stop Aimovig and start Ajovy at the time.   He recent eye examination about 3 weeks ago, he was given new contact lenses.  He works part-time in Biomedical scientist.  He had previously also seen Dr. Phillips Odor in neurology for low back pain and fibromyalgia.  He had seen Dr. Chales Salmon in February 2020 for migraine headaches and episode of shaking.    His Past Medical History Is Significant For: Past Medical History:  Diagnosis Date   Anorexia 03/01/2020   Anxiety    Asthma    Bipolar affective disorder, manic, in partial or remission    Elevated PSA 11/14/2021   Environmental allergies    Gonorrhea in male    Hepatitis C virus infection resolved after antiviral drug therapy 05/03/2016   Herpes    HIV disease ([place])    Hyperlipidemia 11/13/2022   Hypertension    Low testosterone 03/01/2020   Migraine    Neuropathic pain    Opioid dependence on agonist therapy ([place])  PTSD (post-traumatic stress disorder)    Recurrent sinus infections 02/04/2019   Seizures ([facility])     His Past Surgical History Is Significant For: Past Surgical History:  Procedure Laterality Date   CYST EXCISION     ESOPHAGOGASTRODUODENOSCOPY  2008   Dr. Oneida Alar: Erosive reflux esophagitis, gastritis, small bowel biopsy negative for celiac, gastric biopsy negative for H. pylori    His Family History Is Significant For: Family History  Problem Relation Age of Onset   Migraines Mother     His Social History Is Significant  For: Social History   Socioeconomic History   Marital status: Unknown    Spouse name: Not on file   Number of children: 1   Years of education: Not on file   Highest education level: Not on file  Occupational History   Not on file  Tobacco Use   Smoking status: Every Day    Packs/day: 0.25    Years: 16.00    Additional pack years: 0.00    Total pack years: 4.00    Types: Cigarettes    Start date: 12/11/1994   Smokeless tobacco: Never   Tobacco comments:    Is vaping to help reduce cigarette use    Pt states takes THC   Vaping Use   Vaping Use: Never used  Substance and Sexual Activity   Alcohol use: No    Alcohol/week: 0.0 standard drinks of alcohol   Drug use: Not Currently    Types: Marijuana    Comment: drugs in mid-20s. intranasal/iv   Sexual activity: Not Currently    Partners: Female, Male    Birth control/protection: Condom    Comment: pt. declined condoms  Other Topics Concern   Not on file  Social History Narrative   Not on file   Social Determinants of Health   Financial Resource Strain: Not on file  Food Insecurity: Not on file  Transportation Needs: Not on file  Physical Activity: Not on file  Stress: Not on file  Social Connections: Not on file    His Allergies Are:  Allergies  Allergen Reactions   Zanaflex [Tizanidine Hcl]     Seizure   Diclofenac Sodium Hives    Interaction with HIV medications    Naloxone Other (See Comments)    headache   Propoxyphene Nausea And Vomiting   Darvocet [Propoxyphene N-Acetaminophen] Nausea And Vomiting  :   His Current Medications Are:  Outpatient Encounter Medications as of 03/05/2023  Medication Sig   AIMOVIG 70 MG/ML SOAJ Inject into the skin.   ALPRAZolam (XANAX) 1 MG tablet Take 2 mg by mouth 3 (three) times daily.   amphetamine-dextroamphetamine (ADDERALL) 20 MG tablet Take 1 tablet (20 mg total) by mouth 4 (four) times daily.   atorvastatin (LIPITOR) 20 MG tablet Take 1 tablet (20 mg total) by  mouth daily.   bictegravir-emtricitabine-tenofovir AF (BIKTARVY) 50-200-25 MG TABS tablet Take 1 tablet by mouth daily. Try to take at the same time each day with or without food.   COMBIVENT RESPIMAT 20-100 MCG/ACT AERS respimat Inhale 1 puff into the lungs every 6 (six) hours as needed.   diclofenac Sodium (VOLTAREN) 1 % GEL    dronabinol (MARINOL) 5 MG capsule Take 1 capsule (5 mg total) by mouth 2 (two) times daily before lunch and supper.   fluticasone (FLONASE) 50 MCG/ACT nasal spray USE 2 SPRAYS IEN D   lumateperone tosylate (CAPLYTA) 42 MG capsule Caplyta   montelukast (SINGULAIR) 10 MG tablet TK 1 T PO  QHS   NICOTINE STEP 1 21 MG/24HR patch Place 1 patch (21 mg total) onto the skin daily.   OLANZapine-FLUoxetine HCl (SYMBYAX PO) Take by mouth.   omeprazole (PRILOSEC) 20 MG capsule Take 20 mg by mouth daily.   ondansetron (ZOFRAN-ODT) 8 MG disintegrating tablet Take 1 tablet (8 mg total) by mouth 4 (four) times daily as needed for nausea or vomiting.   pantoprazole (PROTONIX) 40 MG tablet    RESTASIS 0.05 % ophthalmic emulsion    rizatriptan (MAXALT) 10 MG tablet    sodium fluoride (PREVIDENT 5000 PLUS) 1.1 % CREA dental cream    Study [id number] - hepatitis B recombinant vaccine (ENGERIX-B) IM injection 20 mcg/mL ([place]) Inject in the deltoid muscle.  [id number] H [id number] E [id number] [id number]   testosterone cypionate (DEPOTESTOSTERONE CYPIONATE) 200 MG/ML injection    valACYclovir (VALTREX) 1000 MG tablet Take 1 tablet (1,000 mg total) by mouth daily. *MAY TAKE ONE TABLET IN ADDITION AS NEEDED   zolpidem (AMBIEN) 10 MG tablet Take by mouth.   No facility-administered encounter medications on file as of 03/05/2023.  :   Review of Systems:  Out of a complete 14 point review of systems, all are reviewed and negative with the exception of these symptoms as listed below:  Review of Systems  Neurological:        Pt here for Migraines Pt states wants to discuss Botox for migraines  Pt states 7  migraines in last month Pt states 1 migraine last for several days Pt states with migraines he has light sensitivity and nausea     Objective:  Neurological Exam  Physical Exam Physical Examination:   Vitals:   03/05/23 0830  BP: (!) 146/91  Pulse: 100    General Examination: The patient is a very pleasant 42 y.o. male in no acute distress. He appears well-developed and well-nourished and well groomed.   HEENT: Normocephalic, atraumatic, pupils are equal, round and reactive to light, extraocular tracking is good without limitation to gaze excursion or nystagmus noted. Hearing is grossly intact. Face is symmetric with normal facial animation. Speech is clear with no dysarthria noted. There is no hypophonia. There is no lip, neck/head, jaw or voice tremor. Neck is supple with full range of passive and active motion. There are no carotid bruits on auscultation. Oropharynx exam reveals: moderate mouth dryness, adequate dental hygiene. Tongue protrudes centrally and palate elevates symmetrically.   Chest: Clear to auscultation without wheezing, rhonchi or crackles noted.  Heart: S1+S2+0, regular and normal without murmurs, rubs or gallops noted.   Abdomen: Soft, non-tender and non-distended.  Extremities: There is no pitting edema in the distal lower extremities bilaterally.   Skin: Warm and dry without trophic changes noted.   Musculoskeletal: exam reveals no obvious joint deformities.   Neurologically:  Mental status: The patient is awake, alert and oriented in all 4 spheres. His immediate and remote memory, attention, language skills and fund of knowledge are appropriate. There is no evidence of aphasia, agnosia, apraxia or anomia. Speech is clear with normal prosody and enunciation. Thought process is linear. Mood is normal and affect is normal.  Cranial nerves II - XII are as described above under HEENT exam.  Motor exam: Normal bulk, strength and tone is noted. There is no obvious  action or resting tremor.  Fine motor skills and coordination: Normal finger taps, hand movements and rapid alternating patting in both upper extremities, normal foot agility.   Reflexes are 1-2+ throughout, toes are downgoing bilaterally.  Cerebellar testing: No dysmetria or intention tremor. There is no truncal or gait ataxia.  Normal finger-to-nose. Sensory exam: intact to light touch in the upper and lower extremities.  Gait, station and balance: He stands easily. No veering to one side is noted. No leaning to one side is noted. Posture is age-appropriate and stance is narrow based. Gait shows normal stride length and normal pace. No problems turning are noted.   Assessment and Plan:  In summary, Eri H Wemhoff is a very pleasant 42 y.o.-year old male with an underlying complex medical history of hypertension, hyperlipidemia, low testosterone, anxiety, mood disorder, chronic pain, on chronic narcotic pain medication, PTSD (per chart review), allergies, HIV disease, history of hepatitis C, seizures (per chart review), and migraine headaches, who presents for evaluation of his migraine headaches, for Botox injections.  He has a longstanding history of migraine headaches.  He has been on Aimovig injections but has not started the Ajovy you are prescribed recently.  He is advised to follow-up with his pharmacy on this, he may have a chance to improve his migraines with Ajovy injections.  We talked about headache triggers and alleviating factors, he is encouraged to stay better hydrated with water, 6 to 8 cups of water per day are recommended, 8 ounce size each, he is advised to limit his caffeine intake to 1-2 servings per day.  He is encouraged to continue with smoking cessation efforts.  We will proceed with Botox authorization through our office.  He is advised to keep his appointment with your office in the interim as there is no guarantee that he will be approved for Botox injections, we will need  more information as to medications tried and failed for prevention for migraines as well as acute treatment of migraines.  He is encouraged to provide this information, he can email as through [employer].  He is also encouraged to email [place] through [place] once he has spoken to his pharmacy about the Ajovy injections or your office about the prior authorization.  Again, for now, he is advised to follow-up with you as scheduled for medical management of his migraine headaches. We will keep him posted from our end of things as far as the Botox authorization.   Thank you very much for allowing me to participate in the care of this nice patient. If I can be of any further assistance to you please do not hesitate to call me at [phone].  Sincerely,   Star Age, MD, PhD

## 2023-03-05 NOTE — Patient Instructions (Signed)
We can start the Botox injection authorization for migraine headaches but will need likely more information as to what medications you have tried and failed for migraine prevention and migraine acute treatment.  Please give [place] the list of medications that you have tried and failed.  For now, please keep your appointment with Dr. Dianna Rossetti as scheduled as there is no guarantee that you will be approved for Botox injections.  Please check with your pharmacy and Dr. Dianna Rossetti' office regarding the [place] approval, it is possible that with Ajovy you have better success for migraine prevention compared to [place].

## 2023-03-05 NOTE — Telephone Encounter (Signed)
    This has been approved for [employer]- still waiting for the Botox One report to see if a PA is needed for his medicaid.

## 2023-03-19 NOTE — Telephone Encounter (Signed)
Pt asking if Botox was approved through insurance yet, requesting a call back.

## 2023-03-21 NOTE — Telephone Encounter (Signed)
Scheduled for 04/11/23 at 11:15 am with Dr. Frances Furbish.

## 2023-03-28 ENCOUNTER — Other Ambulatory Visit: Payer: Self-pay | Admitting: Infectious Disease

## 2023-04-03 ENCOUNTER — Other Ambulatory Visit: Payer: Self-pay | Admitting: Infectious Disease

## 2023-04-03 DIAGNOSIS — B20 Human immunodeficiency virus [HIV] disease: Secondary | ICD-10-CM

## 2023-04-11 ENCOUNTER — Ambulatory Visit (INDEPENDENT_AMBULATORY_CARE_PROVIDER_SITE_OTHER): Payer: 59 | Admitting: Neurology

## 2023-04-11 VITALS — BP 130/91 | HR 79 | Ht 71.0 in

## 2023-04-11 DIAGNOSIS — G43719 Chronic migraine without aura, intractable, without status migrainosus: Secondary | ICD-10-CM | POA: Diagnosis not present

## 2023-04-11 MED ORDER — ONABOTULINUMTOXINA 200 UNITS IJ SOLR
155.0000 [IU] | Freq: Once | INTRAMUSCULAR | Status: AC
  Administered 2023-04-11: 155 [IU] via INTRAMUSCULAR

## 2023-04-11 NOTE — Progress Notes (Signed)
Subjective:    Patient ID: Randy Castillo is a 42 y.o. male.  HPI     Randy Castillo is a 42 year old male with an underlying complex medical history of hypertension, hyperlipidemia, low testosterone, anxiety, mood disorder, chronic pain, on chronic narcotic pain medication, PTSD (per chart review), allergies, HIV disease, history of hepatitis C, seizures (per chart review), and migraine headaches, who presents for initial botulinum toxin injection for intractable chronic migraines.  The patient is unaccompanied today.  I first met him at the request of his neurologist on 03/05/2023, at which time the patient reported chronic migraines.  His neurologist does not perform Botox injections.  The patient was advised to continue with his medical management and follow-up through his existing neurologist and we proceeded with Botox authorization for intractable migraines.   Randy Castillo has had migraine headaches for years and has tried and failed multiple abortive and preventive medications.   Written informed consent for recurrent, 3 monthly intramuscular injections with botulinum toxin for this indication has been obtained and will be scanned into the patient's electronic chart.  I talked to the patient in detail about expectations, limitations, benefits as well as potential adverse effects of botulinum toxin injections. The patient understands that the side effects include (but are not limited to): Mouth dryness, dryness of eyes, speech and swallowing difficulties, respiratory depression or problems breathing, weakness of muscles including more distant muscles than the ones injected, flu-like symptoms, myalgias, injection site reactions such as redness, itching, swelling, pain, and infection. Randy Castillo has no history of neuromuscular disease such as Lambert-Eaton syndrome or myasthenia gravis.  200 units of botulinum toxin type A in the form of Botox were reconstituted using preservative-free normal saline to a  concentration of 10 units per 0.1 mL and drawn up in two 1 mL tuberculin syringes. Lot number and expiration dates as below.  The patient was situated in a chair, sitting comfortably. After preparing the areas with 70% isopropyl alcohol and using a 26 gauge 1 1/2 inch hollow lumen recording EMG needle for the neck injections as well as a 30 gauge 1 inch needle for the facial injections, a total dose of 155 units of botulinum toxin type A in the form of Botox  was injected into the muscles and the following distribution and quantities:  #1: 10 units on the right and 10 units in the left frontalis muscles, broken down in 2 sites on each side. #2: 5 units in the right and 5 units in the left corrugator muscles. #3: 15 units in the right and 15 units in the left occipitalis muscles, broken down in 3 sites on each side. #4: 20 units in the right and 20 units in the left temporalis muscles, broken down in 4 sites on each side.  #5: 15 units on the right and 15 units in the left upper trapezius muscles, broken down in 3 sites on each side. #6: 10 units in the right and 10 units in the left splenius capitis muscles, broken down in 2 sites on each side.  #7: 2.5 units in the right and 2.5 units in the left procerus muscles.  EMG guidance was utilized for the neck injections with mild EMG activity noted, especially in the left upper trapezius areas.  A dose of 45 units out of a total dose of 200 units was discarded as unavoidable waste.   The patient tolerated the procedure well without immediate complications. Randy Castillo was advised to make a followup appointment for repeat injections in  3 months from now and encouraged to call us with any interim questions, concerns, problems, or updates.  Randy Castillo was also advised to keep his follow-up appointment with his neurologist for ongoing medical management of his migraines as Randy Castillo has prescription medications through that office.  The hope is that when the Botox kicks in and is  continually helpful on a longer-term basis, that Randy Castillo can be tapered off of some of his medications over time.  Randy Castillo was in agreement and did not have any questions prior to leaving clinic today.   Botox- 200 units x 1 vial Lot: [id number] Expiration: 05/2025 NDC: 4540-9811-91   Bacteriostatic 0.9% Sodium Chloride-  2.2 mL  Lot: [id number] Expiration: 111/2025 NDC: 21308-657-84  Previously:   03/05/23: (Randy Castillo) reports a longstanding history of migraine headaches of about 10 years.  Triggers include not eating on a regular basis or not drinking enough water Randy Castillo admits.  Symptoms are associated with nausea and sometimes vomiting, photophobia, headaches are often in the neck area and bitemporal areas and may last up to 4 days.  Randy Castillo reports that most of the month Randy Castillo has a migraine headache or headache.  Randy Castillo takes over-the-counter ibuprofen, 800 mg as needed and Maxalt has been helpful per PCP prescription and Randy Castillo is also on Aimovig injections per PCP.  Randy Castillo has not yet started Ajovy injections as prescribed by you, Randy Castillo reports that Randy Castillo has not been contacted that the prior authorization has been approved.  Randy Castillo is encouraged to check with his pharmacy.  Randy Castillo is unable to provide a list of medications Randy Castillo has tried and failed for migraine prevention or acute migraine management.  Randy Castillo reports that his PCP has prescribed the Aimovig and Maxalt.  It helps to go to bed and sleep in a dark room, sometimes taking Xanax helps as well.  Randy Castillo drinks caffeine in the form of coffee, 2 cups/day, 1 glass of tea, usually no daily soda, Randy Castillo admits to drinking only 2 to 3 glasses of water per day.  Randy Castillo does not drink any alcohol.  Randy Castillo is working on smoking cessation and currently smokes about 4 to 5 cigarettes/day. I reviewed your office note from 02/07/2023.  Randy Castillo has been on Aimovig injections as well as Maxalt as needed.  Of note, Randy Castillo is on multiple high risk medications as well as psychotropic medications, Randy Castillo was interested in pursuing Botox injections  for his migraines.  Randy Castillo was referred for evaluation of Botox injections for chronic migraines.  Randy Castillo was advised to stop Aimovig and start Ajovy at the time.    Randy Castillo recent eye examination about 3 weeks ago, Randy Castillo was given new contact lenses.  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   Randy Castillo had previously also seen Dr. Beryle Beams in neurology for low back pain and fibromyalgia.  Randy Castillo had seen Dr. Stacy Gardner in February 2020 for migraine headaches and episode of shaking.    Review of Systems  Neurological:        Pt here for Botox  Botox- 200 units x 1 vial Lot: [id number] Expiration: 05/2025 NDC: 8413-2440-10  Bacteriostatic 0.9% Sodium Chloride-  2.2 mL  Lot: [id number] Expiration: 111/2025 NDC: 44034-742-59  Dx: G43.719 B/B Witnessed by Delmer Islam

## 2023-04-11 NOTE — Patient Instructions (Signed)
Please remember, botulinum toxin takes about 3-7 days to kick in. As discussed, this is not a pain shot. The purpose of the injections is to gradually improve your symptoms. In some patients it takes up to 2-3 weeks to make a difference and it wears off with time. Sometimes it may wear off before it is time for the next injection. We still should wait till the next 3 monthly injection, because injecting too frequently may cause you to develop immunity to the botulinum toxin. We are looking for a reduction in the severity and/or frequency of your symptoms. As a reminder, side effects to look out for are (but not limited to): mouth dryness, dryness of the eyes, heaviness of your head or muscle weakness, including droopy face or droopy eyelid(s), rarely: speech or swallowing difficulties and very rarely: breathing difficulties. Some people have transient neck pain or soreness which typically responds to over-the-counter anti-inflammatory medication and local heat application with a heat pad. If you think you have a severe reaction to the botulinum toxin, such as weakness, trouble speaking, trouble breathing, or trouble swallowing, you have to call 911 or have someone take you to the nearest emergency room. However, most people have either no or minimal side effects from the injections. It is normal to have a little bit of redness and swelling around the injection sites which usually improves after a few hours. Rarely, there may be a bruise that improves on its own. Most side effects reported are very mild and resolve within 10-14 days. Please feel free to call us if you have any additional questions or concerns: [phone] or email us through My Chart. We may have to adjust the dose over time, depending on your results from this injection and your overall response over time to this medication.   

## 2023-04-16 DIAGNOSIS — R7989 Other specified abnormal findings of blood chemistry: Secondary | ICD-10-CM | POA: Diagnosis not present

## 2023-05-02 ENCOUNTER — Encounter: Payer: Self-pay | Admitting: Infectious Disease

## 2023-05-02 ENCOUNTER — Other Ambulatory Visit: Payer: Self-pay

## 2023-05-02 ENCOUNTER — Other Ambulatory Visit (HOSPITAL_COMMUNITY)
Admission: RE | Admit: 2023-05-02 | Discharge: 2023-05-02 | Disposition: A | Discharge status: Home / Self Care | Payer: 59 | Source: Ambulatory Visit | Attending: Infectious Disease | Admitting: Infectious Disease

## 2023-05-02 ENCOUNTER — Ambulatory Visit (INDEPENDENT_AMBULATORY_CARE_PROVIDER_SITE_OTHER): Payer: 59 | Admitting: Infectious Disease

## 2023-05-02 VITALS — BP 141/93 | HR 91 | Temp 99.3°F | Wt 170.0 lb

## 2023-05-02 DIAGNOSIS — B20 Human immunodeficiency virus [HIV] disease: Secondary | ICD-10-CM

## 2023-05-02 DIAGNOSIS — D72829 Elevated white blood cell count, unspecified: Secondary | ICD-10-CM | POA: Diagnosis not present

## 2023-05-02 DIAGNOSIS — S40269A Insect bite (nonvenomous) of unspecified shoulder, initial encounter: Secondary | ICD-10-CM

## 2023-05-02 DIAGNOSIS — W57XXXA Bitten or stung by nonvenomous insect and other nonvenomous arthropods, initial encounter: Secondary | ICD-10-CM | POA: Diagnosis not present

## 2023-05-02 DIAGNOSIS — R630 Anorexia: Secondary | ICD-10-CM

## 2023-05-02 DIAGNOSIS — F909 Attention-deficit hyperactivity disorder, unspecified type: Secondary | ICD-10-CM | POA: Diagnosis not present

## 2023-05-02 MED ORDER — BICTEGRAVIR-EMTRICITAB-TENOFOV 50-200-25 MG PO TABS
1.0000 | ORAL_TABLET | Freq: Every day | ORAL | 11 refills | Status: DC
Start: 2023-05-02 — End: 2023-05-15

## 2023-05-02 MED ORDER — ATORVASTATIN CALCIUM 20 MG PO TABS: 20.0000 mg | ORAL_TABLET | Freq: Every day | ORAL | 11 refills | Status: DC

## 2023-05-02 MED ORDER — DRONABINOL 5 MG PO CAPS
5.0000 mg | ORAL_CAPSULE | Freq: Two times a day (BID) | ORAL | 3 refills | Status: DC
Start: 2023-05-02 — End: 2023-05-15

## 2023-05-02 NOTE — Progress Notes (Signed)
Subjective:  Chief complaints: Concerns about leukocytosis that was discovered on labs with [facility] sky I will mail HRT clinic.  Also recent tick bite  Patient ID:   Randy Castillo, male    DOB: 06-Jan-1981, 42 y.o.   MRN: [id number]  HPI  Randy Castillo is a 42 year old Caucasian man living at [facility] view is well-controlled on [place].  He has a history of bipolar disorder also low testosterone.    His history of opiate dependence is on chronic opiate management also along with chronic nausea taking Marinol and Zofran.   The last time we started atorvastatin based on the reprieve study.  He he had some headaches that he attributed to atorvastatin but they disappeared.  In the interim he apparently in April developed a tick bite shortly thereafter words he had flulike symptoms body aches chills.  He was seen by primary care and prescribed doxycycline for 10 days.  He is not sure if he was sick from the tick bite or if from a viral infection that his son apparently contracted.  He was seen at [facility] for routine labs and white blood cell count was found to be high at 13,600 on 17 May.  He was going prescribed another round of antibiotics but he had not begun it because he did not feel poorly and question whether he needed it.  He is going back on testosterone through [facility].  He requested Marinol for his nausea.  I note that he had not filled it since December he thought this was due to a prior authorization issue.    Past Medical History:  Diagnosis Date   Anorexia 03/01/2020   Anxiety    Asthma    Bipolar affective disorder, manic, in partial or remission    Elevated PSA 11/14/2021   Environmental allergies    Gonorrhea in male    Hepatitis C virus infection resolved after antiviral drug therapy 05/03/2016   Herpes    HIV disease (HCC)    Hyperlipidemia 11/13/2022   Hypertension    Low testosterone 03/01/2020   Migraine    Neuropathic pain    Opioid dependence on agonist  therapy (HCC)    PTSD (post-traumatic stress disorder)    Recurrent sinus infections 02/04/2019   Seizures (HCC)     Past Surgical History:  Procedure Laterality Date   CYST EXCISION     ESOPHAGOGASTRODUODENOSCOPY  2008   Dr. Darrick Penna: Erosive reflux esophagitis, gastritis, small bowel biopsy negative for celiac, gastric biopsy negative for H. pylori    Family History  Problem Relation Age of Onset   Migraines Mother       Social History   Socioeconomic History   Marital status: Unknown    Spouse name: Not on file   Number of children: 1   Years of education: Not on file   Highest education level: Not on file  Occupational History   Not on file  Tobacco Use   Smoking status: Every Day    Packs/day: 0.25    Years: 16.00    Additional pack years: 0.00    Total pack years: 4.00    Types: Cigarettes    Start date: 12/11/1994   Smokeless tobacco: Never   Tobacco comments:    Is vaping to help reduce cigarette use    Pt states takes THC   Vaping Use   Vaping Use: Never used  Substance and Sexual Activity   Alcohol use: No    Alcohol/week: 0.0 standard drinks  of alcohol   Drug use: Not Currently    Types: Marijuana    Comment: drugs in mid-20s. intranasal/iv   Sexual activity: Not Currently    Partners: Female, Male    Birth control/protection: Condom    Comment: pt. declined condoms  Other Topics Concern   Not on file  Social History Narrative   Not on file   Social Determinants of Health   Financial Resource Strain: Not on file  Food Insecurity: Not on file  Transportation Needs: Not on file  Physical Activity: Not on file  Stress: Not on file  Social Connections: Not on file    Allergies  Allergen Reactions   Zanaflex [Tizanidine Hcl]     Seizure   Diclofenac Sodium Hives    Interaction with HIV medications    Naloxone Other (See Comments)    headache   Propoxyphene Nausea And Vomiting   Darvocet [Propoxyphene N-Acetaminophen] Nausea And Vomiting      Current Outpatient Medications:    ALPRAZolam (XANAX) 1 MG tablet, Take 2 mg by mouth 3 (three) times daily., Disp: , Rfl:    amphetamine-dextroamphetamine (ADDERALL) 20 MG tablet, Take 1 tablet (20 mg total) by mouth 4 (four) times daily., Disp: 12 tablet, Rfl: 0   atorvastatin (LIPITOR) 20 MG tablet, Take 1 tablet (20 mg total) by mouth daily., Disp: 30 tablet, Rfl: 11   bictegravir-emtricitabine-tenofovir AF (BIKTARVY) 50-200-25 MG TABS tablet, Take 1 tablet by mouth daily. Try to take at the same time each day with or without food., Disp: 30 tablet, Rfl: 11   COMBIVENT RESPIMAT 20-100 MCG/ACT AERS respimat, Inhale 1 puff into the lungs every 6 (six) hours as needed., Disp: 1 Inhaler, Rfl: 0   diclofenac Sodium (VOLTAREN) 1 % GEL, , Disp: , Rfl:    dronabinol (MARINOL) 5 MG capsule, Take 1 capsule (5 mg total) by mouth 2 (two) times daily before lunch and supper., Disp: 60 capsule, Rfl: 3   finasteride (PROPECIA) 1 MG tablet, Take 1 mg by mouth daily., Disp: , Rfl:    fluticasone (FLONASE) 50 MCG/ACT nasal spray, USE 2 SPRAYS IEN D, Disp: 16 g, Rfl: 0   lumateperone tosylate (CAPLYTA) 42 MG capsule, Caplyta, Disp: , Rfl:    montelukast (SINGULAIR) 10 MG tablet, TK 1 T PO QHS, Disp: 3 tablet, Rfl: 9   NICOTINE STEP 1 21 MG/24HR patch, Place 1 patch (21 mg total) onto the skin daily., Disp: 3 patch, Rfl: 0   OLANZapine-FLUoxetine HCl (SYMBYAX PO), Take by mouth., Disp: , Rfl:    omeprazole (PRILOSEC) 20 MG capsule, Take 20 mg by mouth daily., Disp: , Rfl:    ondansetron (ZOFRAN-ODT) 8 MG disintegrating tablet, Take 1 tablet (8 mg total) by mouth 4 (four) times daily as needed for nausea or vomiting., Disp: 30 tablet, Rfl: 5   pantoprazole (PROTONIX) 40 MG tablet, , Disp: , Rfl:    RESTASIS 0.05 % ophthalmic emulsion, , Disp: , Rfl:    rizatriptan (MAXALT) 10 MG tablet, , Disp: , Rfl:    Study - Z6109 - hepatitis B recombinant vaccine (ENGERIX-B) IM injection 20 mcg/mL (PI-[place]), Inject  in the deltoid muscle.  [id number] [id number], Disp: 1 mL, Rfl: 0   testosterone cypionate (DEPOTESTOSTERONE CYPIONATE) 200 MG/ML injection, , Disp: , Rfl:    AIMOVIG 70 MG/ML SOAJ, Inject into the skin., Disp: , Rfl:    sodium fluoride (PREVIDENT 5000 PLUS) 1.1 % CREA dental cream, , Disp: , Rfl:    valACYclovir (  VALTREX) 1000 MG tablet, Take 1 tablet (1,000 mg total) by mouth daily. *MAY TAKE ONE TABLET IN ADDITION AS NEEDED, Disp: 12 tablet, Rfl: 0   zolpidem (AMBIEN) 10 MG tablet, Take by mouth., Disp: , Rfl:    Review of Systems  Constitutional:  Negative for activity change, appetite change, chills, diaphoresis, fatigue, fever and unexpected weight change.  HENT:  Negative for congestion, rhinorrhea, sinus pressure, sneezing, sore throat and trouble swallowing.   Eyes:  Negative for photophobia and visual disturbance.  Respiratory:  Negative for cough, chest tightness, shortness of breath, wheezing and stridor.   Cardiovascular:  Negative for chest pain, palpitations and leg swelling.  Gastrointestinal:  Positive for nausea. Negative for abdominal distention, abdominal pain, anal bleeding, blood in stool, constipation, diarrhea and vomiting.  Genitourinary:  Negative for difficulty urinating, dysuria, flank pain and hematuria.  Musculoskeletal:  Negative for arthralgias, back pain, gait problem, joint swelling and myalgias.  Skin:  Positive for wound. Negative for color change, pallor and rash.  Neurological:  Negative for dizziness, tremors, weakness and light-headedness.  Hematological:  Negative for adenopathy. Does not bruise/bleed easily.  Psychiatric/Behavioral:  Negative for agitation, behavioral problems, confusion, decreased concentration, dysphoric mood and sleep disturbance.        Objective:   Physical Exam Constitutional:      Appearance: He is well-developed.  HENT:     Head: Normocephalic and atraumatic.  Eyes:     Conjunctiva/sclera: Conjunctivae normal.   Cardiovascular:     Rate and Rhythm: Normal rate and regular rhythm.  Pulmonary:     Effort: Pulmonary effort is normal. No respiratory distress.     Breath sounds: No wheezing.  Abdominal:     General: There is no distension.     Palpations: Abdomen is soft.  Musculoskeletal:        General: No tenderness. Normal range of motion.     Cervical back: Normal range of motion and neck supple.  Skin:    General: Skin is warm and dry.     Coloration: Skin is not pale.     Findings: No erythema or rash.  Neurological:     General: No focal deficit present.     Mental Status: He is alert and oriented to person, place, and time.  Psychiatric:        Mood and Affect: Mood normal.        Behavior: Behavior normal.        Thought Content: Thought content normal.        Judgment: Judgment normal.      Site of tick bite (he also had another one lower in back)       Assessment & Plan:     HIV disease:  I will add order HIV viral load CD4 count CBC with differential CMP, RPR GC and chlamydia and I will continue  Nayson H Robben's Biktarvy,  prescription  Tick bite with recent flulike illness: Certainly could have been early ketosis or [facility] spotted fever.  He is physically feeling much better.  I do not see indication for further antibiotics I check serologies for Ehrlichia and [facility] spotted fever since is now nearly a month since he suffered this bite and had symptoms consistent with infection.  Leukocytosis: Not clear what is causing this we will repeat a CBC in our clinic.    Plus: Nonreactive RPR now  Dependence on chronic opiates through pain management.  Nausea vomiting: I renewed his Marinol but suggested may  be prudent to have the pain management doctor or primary care doctor manage this.  Currently he has at least 3 different prescribers prescribing controlled substances for him these include pain management psychiatry myself.  I have reviewed his  [place] database with regards to controlled substances again prior to renewing his Marinol  Hyperlipidemia continue atorvastatin check lipid panel today.  Vaccine counseling recommended updated COVID-19 vaccine which she did not want.

## 2023-05-03 LAB — URINE CYTOLOGY ANCILLARY ONLY
Chlamydia: NEGATIVE
Comment: NEGATIVE
Comment: NORMAL
Neisseria Gonorrhea: NEGATIVE

## 2023-05-03 LAB — T-HELPER CELLS (CD4) COUNT (NOT AT ARMC): Absolute CD4: 910 cells/uL (ref 490–1740)

## 2023-05-03 LAB — LIPID PANEL: Total CHOL/HDL Ratio: 2.7 (calc) (ref <5.0)

## 2023-05-08 ENCOUNTER — Telehealth: Payer: Self-pay

## 2023-05-08 NOTE — Telephone Encounter (Signed)
Spoke with Randy Castillo, relayed that his results are not indicative of a new syphilis infection and he does not need treatment. He is also asking about his WBC, relayed that this was within normal limits. Patient verbalized understanding and has no further questions.   [name], RN

## 2023-05-08 NOTE — Telephone Encounter (Signed)
Received call from patient wanting to discuss recent lab results. Relayed that urine was negative for gonorrhea and chlamydia and that viral load is undetectable.   He is concerned about reactive RPR, reports no sexual activity since his last non-reactive RPR in November 2023. Will route to provider.   [name], RN

## 2023-05-09 LAB — CBC WITH DIFFERENTIAL/PLATELET
Absolute Monocytes: 482 cells/uL (ref 200–950)
Basophils Absolute: 28 cells/uL (ref 0–200)
Basophils Relative: 0.5 %
Eosinophils Absolute: 101 cells/uL (ref 15–500)
Eosinophils Relative: 1.8 %
HCT: 46.8 % (ref 38.5–50.0)
Hemoglobin: 16 g/dL (ref 13.2–17.1)
Lymphs Abs: 2475 cells/uL (ref 850–3900)
MCH: 31.3 pg (ref 27.0–33.0)
MCHC: 34.2 g/dL (ref 32.0–36.0)
MCV: 91.6 fL (ref 80.0–100.0)
MPV: 10.9 fL (ref 7.5–12.5)
Monocytes Relative: 8.6 %
Neutro Abs: 2514 cells/uL (ref 1500–7800)
Neutrophils Relative %: 44.9 %
Platelets: 225 10*3/uL (ref 140–400)
RBC: 5.11 10*6/uL (ref 4.20–5.80)
RDW: 12.6 % (ref 11.0–15.0)
Total Lymphocyte: 44.2 %
WBC: 5.6 10*3/uL (ref 3.8–10.8)

## 2023-05-09 LAB — LIPID PANEL
Cholesterol: 156 mg/dL (ref <200)
HDL: 57 mg/dL (ref >=40)
LDL Cholesterol (Calc): 81 mg/dL (calc)
Non-HDL Cholesterol (Calc): 99 mg/dL (calc) (ref <130)
Triglycerides: 98 mg/dL (ref <150)

## 2023-05-09 LAB — T-HELPER CELLS (CD4) COUNT (NOT AT ARMC)
CD4 T Helper %: 38 % (ref 30–61)
Total lymphocyte count: 2374 cells/uL (ref 850–3900)

## 2023-05-09 LAB — COMPLETE METABOLIC PANEL WITH GFR
AG Ratio: 1.9 (calc) (ref 1.0–2.5)
ALT: 34 U/L (ref 9–46)
AST: 28 U/L (ref 10–40)
Albumin: 4.4 g/dL (ref 3.6–5.1)
Alkaline phosphatase (APISO): 72 U/L (ref 36–130)
BUN: 10 mg/dL (ref 7–25)
CO2: 28 mmol/L (ref 20–32)
Calcium: 9.5 mg/dL (ref 8.6–10.3)
Chloride: 104 mmol/L (ref 98–110)
Creat: 0.9 mg/dL (ref 0.60–1.29)
Globulin: 2.3 g/dL (calc) (ref 1.9–3.7)
Glucose, Bld: 99 mg/dL (ref 65–99)
Potassium: 4.2 mmol/L (ref 3.5–5.3)
Sodium: 139 mmol/L (ref 135–146)
Total Bilirubin: 0.3 mg/dL (ref 0.2–1.2)
Total Protein: 6.7 g/dL (ref 6.1–8.1)
eGFR: 110 mL/min/{1.73_m2} (ref >=60)

## 2023-05-09 LAB — HIV-1 RNA QUANT-NO REFLEX-BLD
HIV 1 RNA Quant: <20 Copies/mL — ABNORMAL HIGH
HIV-1 RNA Quant, Log: <1.3 Log cps/mL — ABNORMAL HIGH

## 2023-05-09 LAB — T PALLIDUM AB: T Pallidum Abs: POSITIVE — AB

## 2023-05-09 LAB — EHRLICHIA ANTIBODY PANEL
E. CHAFFEENSIS AB IGG: <1:64 {titer}
E. CHAFFEENSIS AB IGM: <1:20 {titer}

## 2023-05-09 LAB — ROCKY MTN SPOTTED FVR ABS PNL(IGG+IGM)
RMSF IgG: NOT DETECTED
RMSF IgM: NOT DETECTED

## 2023-05-09 LAB — RPR TITER: RPR Titer: 1:1 {titer} — ABNORMAL HIGH

## 2023-05-09 LAB — RPR: RPR Ser Ql: REACTIVE — AB

## 2023-05-15 ENCOUNTER — Other Ambulatory Visit: Payer: Self-pay | Admitting: Infectious Disease

## 2023-05-15 ENCOUNTER — Telehealth: Payer: Self-pay

## 2023-05-15 DIAGNOSIS — B20 Human immunodeficiency virus [HIV] disease: Secondary | ICD-10-CM

## 2023-05-15 DIAGNOSIS — R630 Anorexia: Secondary | ICD-10-CM

## 2023-05-15 MED ORDER — DRONABINOL 5 MG PO CAPS
5.0000 mg | ORAL_CAPSULE | Freq: Two times a day (BID) | ORAL | 3 refills | Status: DC
Start: 2023-05-15 — End: 2023-05-15

## 2023-05-15 MED ORDER — BICTEGRAVIR-EMTRICITAB-TENOFOV 50-200-25 MG PO TABS
1.0000 | ORAL_TABLET | Freq: Every day | ORAL | 11 refills | Status: DC
Start: 2023-05-15 — End: 2024-04-21

## 2023-05-15 MED ORDER — DRONABINOL 5 MG PO CAPS
5.0000 mg | ORAL_CAPSULE | Freq: Two times a day (BID) | ORAL | 3 refills | Status: AC
Start: 2023-05-15 — End: ?

## 2023-05-15 MED ORDER — ATORVASTATIN CALCIUM 20 MG PO TABS: 20.0000 mg | ORAL_TABLET | Freq: Every day | ORAL | 11 refills | Status: DC

## 2023-05-15 NOTE — Telephone Encounter (Signed)
Spoke with [facility], dronabinol cannot be called in, must be electronically sent by provider. Refills of Biktarvy and atorvastatin sent.   [name], RN

## 2023-05-15 NOTE — Telephone Encounter (Signed)
Received voicemail from [facility] requesting refills for patient's atorvastatin, Biktarvy, and dronabinol. Refills were recently sent to [facility].   P: [phone]  [name], RN

## 2023-05-15 NOTE — Addendum Note (Signed)
Addended by: Linna Hoff D on: 05/15/2023 11:22 AM   Modules accepted: Orders

## 2023-05-16 DIAGNOSIS — M5030 Other cervical disc degeneration, unspecified cervical region: Secondary | ICD-10-CM | POA: Diagnosis not present

## 2023-05-16 DIAGNOSIS — M47812 Spondylosis without myelopathy or radiculopathy, cervical region: Secondary | ICD-10-CM | POA: Diagnosis not present

## 2023-05-16 DIAGNOSIS — M7918 Myalgia, other site: Secondary | ICD-10-CM | POA: Diagnosis not present

## 2023-05-17 ENCOUNTER — Telehealth: Payer: Self-pay

## 2023-05-17 NOTE — Telephone Encounter (Signed)
Clarification faxed to [facility].   [name], RN

## 2023-05-17 NOTE — Telephone Encounter (Signed)
Patient is being prescribed atorvastatin 20 mg by Dr. Daiva Eves. Received notification from [facility] that he is also being prescribed pitavastatin 20 mg by Dr. Royston Sinner Corrington.   Spoke with Mardell, he confirms he is only taking the atorvastatin. Asked him to please notify Dr. Salvadore Farber that he is already on a statin.   [name], RN

## 2023-06-05 DIAGNOSIS — D2372 Other benign neoplasm of skin of left lower limb, including hip: Secondary | ICD-10-CM | POA: Diagnosis not present

## 2023-06-05 DIAGNOSIS — A630 Anogenital (venereal) warts: Secondary | ICD-10-CM | POA: Diagnosis not present

## 2023-07-05 ENCOUNTER — Ambulatory Visit: Payer: 59 | Admitting: Neurology

## 2023-07-18 ENCOUNTER — Ambulatory Visit (INDEPENDENT_AMBULATORY_CARE_PROVIDER_SITE_OTHER): Payer: 59 | Admitting: Neurology

## 2023-07-18 VITALS — BP 124/87 | HR 94

## 2023-07-18 DIAGNOSIS — G43719 Chronic migraine without aura, intractable, without status migrainosus: Secondary | ICD-10-CM | POA: Diagnosis not present

## 2023-07-18 MED ORDER — ONABOTULINUMTOXINA 200 UNITS IJ SOLR
155.0000 [IU] | Freq: Once | INTRAMUSCULAR | Status: AC
Start: 2023-07-18 — End: 2023-07-18
  Administered 2023-07-18: 155 [IU] via INTRAMUSCULAR

## 2023-07-18 NOTE — Patient Instructions (Signed)
Please remember, botulinum toxin takes about 3-7 days to kick in. As discussed, this is not a pain shot. The purpose of the injections is to gradually improve your symptoms. In some patients it takes up to 2-3 weeks to make a difference and it wears off with time. Sometimes it may wear off before it is time for the next injection. We still should wait till the next 3 monthly injection, because injecting too frequently may cause you to develop immunity to the botulinum toxin. We are looking for a reduction in the severity and/or frequency of your symptoms. As a reminder, side effects to look out for are (but not limited to): mouth dryness, dryness of the eyes, heaviness of your head or muscle weakness, including droopy face or droopy eyelid(s), rarely: speech or swallowing difficulties and very rarely: breathing difficulties. Some people have transient neck pain or soreness which typically responds to over-the-counter anti-inflammatory medication and local heat application with a heat pad. If you think you have a severe reaction to the botulinum toxin, such as weakness, trouble speaking, trouble breathing, or trouble swallowing, you have to call 911 or have someone take you to the nearest emergency room. However, most people have either no or minimal side effects from the injections. It is normal to have a little bit of redness and swelling around the injection sites which usually improves after a few hours. Rarely, there may be a bruise that improves on its own. Most side effects reported are very mild and resolve within 10-14 days. Please feel free to call us if you have any additional questions or concerns: [phone] or email us through My Chart. We may have to adjust the dose over time, depending on your results from this injection and your overall response over time to this medication.   

## 2023-07-18 NOTE — Progress Notes (Signed)
[name] is a 42 year old male with an underlying complex medical history of hypertension, hyperlipidemia, low testosterone, anxiety, mood disorder, chronic pain, on chronic narcotic pain medication, PTSD (per chart review), allergies, HIV disease, history of hepatitis C, seizures (per chart review), and migraine headaches, who presents for his second botulinum toxin injection for intractable chronic migraines.  The patient is accompanied by his 2 sons today. He had his first Botulinum toxin injection on 04/11/2023, at which time he received 155 units of Botox.   Written informed consent for recurrent, 3 monthly intramuscular injections with botulinum toxin for this indication has been obtained and will be scanned into the patient's electronic chart.   I talked to the patient in detail about expectations, limitations, benefits as well as potential adverse effects of botulinum toxin injections. The patient understands that the side effects include (but are not limited to): Mouth dryness, dryness of eyes, speech and swallowing difficulties, respiratory depression or problems breathing, weakness of muscles including more distant muscles than the ones injected, flu-like symptoms, myalgias, injection site reactions such as redness, itching, swelling, pain, and infection. He has no history of neuromuscular disease such as Lambert-Eaton syndrome or myasthenia gravis.   200 units of botulinum toxin type A in the form of Botox were reconstituted using preservative-free normal saline to a concentration of 10 units per 0.1 mL and drawn up in two 1 mL tuberculin syringes. Lot number and expiration dates as below.  O/E: BP 124/87 (BP Location: Left Arm, Patient Position: Sitting)   Pulse 94     He reports a lot better, feels like his migraines improved after the first set of injections about 65%, no side effects, neck pain is better, like he is functioning a lot better and is able to be a better father even because  he is not hurting as much.  The patient was situated in a chair, sitting comfortably. After preparing the areas with 70% isopropyl alcohol and using a 26 gauge 1 1/2 inch hollow lumen recording EMG needle for the neck injections as well as a 30 gauge 1 inch needle for the facial injections, a total dose of 155 units of botulinum toxin type A in the form of Botox was injected into the muscles and the following distribution and quantities:   #1: 10 units on the right and 10 units in the left frontalis muscles, broken down in 2 sites on each side. #2: 5 units in the right and 5 units in the left corrugator muscles. #3: 15 units in the right and 15 units in the left occipitalis muscles, broken down in 3 sites on each side. #4: 20 units in the right and 20 units in the left temporalis muscles, broken down in 4 sites on each side.  #5: 15 units on the right and 15 units in the left upper trapezius muscles, broken down in 3 sites on each side. #6: 10 units in the right and 10 units in the left splenius capitis muscles, broken down in 2 sites on each side.  #7: 2.5 units in the right and 2.5 units in the left procerus muscles.   EMG guidance was utilized for the neck injections with mild to moderate EMG activity noted in the left splenius capitis areas, and mild EMG activity noted in the right upper trapezius areas.  A dose of 45 units out of a total dose of 200 units was discarded as unavoidable waste.    The patient tolerated the procedure well without immediate complications.  He was advised to make a followup appointment for repeat injections in 3 months from now and encouraged to call us with any interim questions, concerns, problems, or updates.  He was also advised to keep his follow-up appointment with his neurologist for ongoing medical management of his migraines as he has prescription medications through that office.  The hope is that when the Botox kicks in and is continually helpful on a  longer-term basis, that he can be tapered off of some of his medications over time.  He was in agreement and did not have any questions prior to leaving clinic today.     Botox- 200 units x 1 vial, b/b Lot: [id number]          EXP 10/2025        NDC 0865-7846-96  Bacteriostatic 0.9% Sodium Chloride -  2 mL  Lot: [id number]    EXP 03/11/2024    NDC  1324-4010-27   Previously:   I first met him at the request of his neurologist on 03/05/2023, at which time the patient reported chronic migraines.  His neurologist does not perform Botox injections.  The patient was advised to continue with his medical management and follow-up through his existing neurologist and we proceeded with Botox authorization for intractable migraines.   He has had migraine headaches for years and has tried and failed multiple abortive and preventive medications.      03/05/23: (He) reports a longstanding history of migraine headaches of about 10 years.  Triggers include not eating on a regular basis or not drinking enough water he admits.  Symptoms are associated with nausea and sometimes vomiting, photophobia, headaches are often in the neck area and bitemporal areas and may last up to 4 days.  He reports that most of the month he has a migraine headache or headache.  He takes over-the-counter ibuprofen, 800 mg as needed and Maxalt has been helpful per PCP prescription and he is also on Aimovig injections per PCP.  He has not yet started Ajovy injections as prescribed by you, he reports that he has not been contacted that the prior authorization has been approved.  He is encouraged to check with his pharmacy.  He is unable to provide a list of medications he has tried and failed for migraine prevention or acute migraine management.  He reports that his PCP has prescribed the Aimovig and Maxalt.  It helps to go to bed and sleep in a dark room, sometimes taking Xanax helps as well.  He drinks caffeine in the form of coffee, 2 cups/day, 1  glass of tea, usually no daily soda, he admits to drinking only 2 to 3 glasses of water per day.  He does not drink any alcohol.  He is working on smoking cessation and currently smokes about 4 to 5 cigarettes/day. I reviewed your office note from 02/07/2023.  He has been on Aimovig injections as well as Maxalt as needed.  Of note, he is on multiple high risk medications as well as psychotropic medications, he was interested in pursuing Botox injections for his migraines.  He was referred for evaluation of Botox injections for chronic migraines.  He was advised to stop Aimovig and start Ajovy at the time.    He recent eye examination about 3 weeks ago, he was given new contact lenses.  He works part-time in Aeronautical engineer.   He had previously also seen Dr. Beryle Beams in neurology for low back pain and fibromyalgia.  He had seen  Dr. Stacy Gardner in February 2020 for migraine headaches and episode of shaking.

## 2023-09-18 NOTE — Telephone Encounter (Signed)
Completed PA form under pt's [employer] and faxed to [phone] along with OV notes.

## 2023-09-26 NOTE — Telephone Encounter (Signed)
I haven't heard back from auth and was not able to get a rep on the phone to help me. I submitted another auth via [employer], status is pending. Ref # [id number].

## 2023-10-01 NOTE — Telephone Encounter (Signed)
Received fax of approval from [place].  Berkley Harvey: [phone] (09/26/23-03/25/24)

## 2023-10-22 ENCOUNTER — Encounter: Payer: Self-pay | Admitting: Neurology

## 2023-10-22 ENCOUNTER — Ambulatory Visit (INDEPENDENT_AMBULATORY_CARE_PROVIDER_SITE_OTHER): Payer: 59 | Admitting: Neurology

## 2023-10-22 VITALS — BP 124/86 | HR 78 | Ht 71.0 in | Wt 172.8 lb

## 2023-10-22 DIAGNOSIS — G43719 Chronic migraine without aura, intractable, without status migrainosus: Secondary | ICD-10-CM

## 2023-10-22 MED ORDER — ONABOTULINUMTOXINA 200 UNITS IJ SOLR
155.0000 [IU] | Freq: Once | INTRAMUSCULAR | Status: AC
Start: 2023-10-22 — End: 2023-10-22
  Administered 2023-10-22: 155 [IU] via INTRAMUSCULAR

## 2023-10-22 NOTE — Progress Notes (Signed)
Subjective:    Patient ID: Randy Castillo is a 42 y.o. male.  HPI  Randy Castillo is a 42 year old male with an underlying complex medical history of hypertension, hyperlipidemia, low testosterone, anxiety, mood disorder, chronic pain, on chronic narcotic pain medication, PTSD (per chart review), allergies, HIV disease, history of hepatitis C, seizures (per chart review), and migraine headaches, who presents for his 3rd botulinum toxin injection for intractable chronic migraines.  The patient is accompanied by his son today.    He had his second injection on 07/18/23, at which time he reported doing well. Botox had been very helpful, no side effect were reported    Written informed consent for recurrent, 3 monthly intramuscular injections with botulinum toxin for this indication has been obtained and will be scanned into the patient's electronic chart.   I talked to the patient in detail about expectations, limitations, benefits as well as potential adverse effects of botulinum toxin injections. The patient understands that the side effects include (but are not limited to): Mouth dryness, dryness of eyes, speech and swallowing difficulties, respiratory depression or problems breathing, weakness of muscles including more distant muscles than the ones injected, flu-like symptoms, myalgias, injection site reactions such as redness, itching, swelling, pain, and infection. He has no history of neuromuscular disease such as Lambert-Eaton syndrome or myasthenia gravis.   200 units of botulinum toxin type A in the form of Botox were reconstituted using preservative-free normal saline to a concentration of 10 units per 0.1 mL and drawn up in two 1 mL tuberculin syringes. Lot number and expiration dates as below.   O/E: BP 124/86   Pulse 78   Ht 5\' 11"  (1.803 m)   Wt 172 lb 12.8 oz (78.4 kg)   BMI 24.10 kg/m       Today, 10/22/2023: He reports doing well, no side effects, has had some flareup in his  headaches in the past couple of weeks but otherwise has done really well.  He is very pleased with how Botox has worked for him thus far.   The patient was situated in a chair, sitting comfortably. After preparing the areas with 70% isopropyl alcohol and using a 26 gauge 1 1/2 inch hollow lumen recording EMG needle for the neck injections as well as a 30 gauge 1 inch needle for the facial injections, a total dose of 155 units of botulinum toxin type A in the form of Botox was injected into the muscles and the following distribution and quantities:   #1: 10 units on the right and 10 units in the left frontalis muscles, broken down in 2 sites on each side. #2: 5 units in the right and 5 units in the left corrugator muscles. #3: 15 units in the right and 15 units in the left occipitalis muscles, broken down in 3 sites on each side. #4: 20 units in the right and 20 units in the left temporalis muscles, broken down in 4 sites on each side.  #5: 15 units on the right and 15 units in the left upper trapezius muscles, broken down in 3 sites on each side. #6: 10 units in the right and 10 units in the left splenius capitis muscles, broken down in 2 sites on each side.  #7: 2.5 units in the right and 2.5 units in the left procerus muscles.   EMG guidance was utilized for the neck injections with mild to moderate EMG activity noted in the left splenius capitis areas, and mild EMG activity  noted in the right upper trapezius areas.  A dose of 45 units out of a total dose of 200 units was discarded as unavoidable waste.     The patient tolerated the procedure well without immediate complications. He was advised to make a followup appointment for repeat injections in 3 months from now and encouraged to call us with any interim questions, concerns, problems, or updates.  He was also advised to keep his follow-up appointment with his neurologist for ongoing medical management of his migraines as he has prescription  medications through that office.  The hope is that when the Botox kicks in and is continually helpful on a longer-term basis, that he can be tapered off of some of his medications over time.  He was in agreement and did not have any questions prior to leaving clinic today.     Botox- 200 units x 1 vial, b/b Lot: [id number]        EXP 02/2026        NDC 0272-5366-44   Bacteriostatic 0.9% Sodium Chloride -  2 mL  Lot: [id number]   EXP 03/11/2024    NDC  0347-4259-56  Previously (copied from previous notes for reference):    He had his first Botulinum toxin injection on 04/11/2023, at which time he received 155 units of Botox.    I first met him at the request of his neurologist on 03/05/2023, at which time the patient reported chronic migraines.  His neurologist does not perform Botox injections.  The patient was advised to continue with his medical management and follow-up through his existing neurologist and we proceeded with Botox authorization for intractable migraines.   He has had migraine headaches for years and has tried and failed multiple abortive and preventive medications.      03/05/23: (He) reports a longstanding history of migraine headaches of about 10 years.  Triggers include not eating on a regular basis or not drinking enough water he admits.  Symptoms are associated with nausea and sometimes vomiting, photophobia, headaches are often in the neck area and bitemporal areas and may last up to 4 days.  He reports that most of the month he has a migraine headache or headache.  He takes over-the-counter ibuprofen, 800 mg as needed and Maxalt has been helpful per PCP prescription and he is also on Aimovig injections per PCP.  He has not yet started Ajovy injections as prescribed by you, he reports that he has not been contacted that the prior authorization has been approved.  He is encouraged to check with his pharmacy.  He is unable to provide a list of medications he has tried and failed for  migraine prevention or acute migraine management.  He reports that his PCP has prescribed the Aimovig and Maxalt.  It helps to go to bed and sleep in a dark room, sometimes taking Xanax helps as well.  He drinks caffeine in the form of coffee, 2 cups/day, 1 glass of tea, usually no daily soda, he admits to drinking only 2 to 3 glasses of water per day.  He does not drink any alcohol.  He is working on smoking cessation and currently smokes about 4 to 5 cigarettes/day. I reviewed your office note from 02/07/2023.  He has been on Aimovig injections as well as Maxalt as needed.  Of note, he is on multiple high risk medications as well as psychotropic medications, he was interested in pursuing Botox injections for his migraines.  He was referred for evaluation  of Botox injections for chronic migraines.  He was advised to stop Aimovig and start Ajovy at the time.    He recent eye examination about 3 weeks ago, he was given new contact lenses.  He works part-time in Aeronautical engineer.   He had previously also seen Dr. Beryle Beams in neurology for low back pain and fibromyalgia.  He had seen Dr. Stacy Gardner in February 2020 for migraine headaches and episode of shaking.

## 2024-01-17 ENCOUNTER — Ambulatory Visit: Payer: 59 | Admitting: Neurology

## 2024-01-18 ENCOUNTER — Other Ambulatory Visit: Payer: Self-pay | Admitting: Infectious Disease

## 2024-01-18 DIAGNOSIS — B20 Human immunodeficiency virus [HIV] disease: Secondary | ICD-10-CM

## 2024-01-18 DIAGNOSIS — E785 Hyperlipidemia, unspecified: Secondary | ICD-10-CM

## 2024-01-21 DIAGNOSIS — B20 Human immunodeficiency virus [HIV] disease: Secondary | ICD-10-CM

## 2024-01-21 DIAGNOSIS — E785 Hyperlipidemia, unspecified: Secondary | ICD-10-CM

## 2024-01-22 ENCOUNTER — Ambulatory Visit: Payer: 59 | Admitting: Neurology

## 2024-01-29 ENCOUNTER — Ambulatory Visit (INDEPENDENT_AMBULATORY_CARE_PROVIDER_SITE_OTHER): Payer: 59 | Admitting: Neurology

## 2024-01-29 VITALS — BP 123/76 | HR 79 | Ht 70.0 in | Wt 172.0 lb

## 2024-01-29 DIAGNOSIS — G43719 Chronic migraine without aura, intractable, without status migrainosus: Secondary | ICD-10-CM | POA: Diagnosis not present

## 2024-01-29 MED ORDER — ONABOTULINUMTOXINA 200 UNITS IJ SOLR
155.0000 [IU] | Freq: Once | INTRAMUSCULAR | Status: AC
Start: 2024-01-29 — End: 2024-01-29
  Administered 2024-01-29: 155 [IU] via INTRAMUSCULAR

## 2024-01-29 NOTE — Progress Notes (Signed)
[name] is a 43 year old male with an underlying complex medical history of hypertension, hyperlipidemia, low testosterone, anxiety, mood disorder, chronic pain, on chronic narcotic pain medication, PTSD (per chart review), allergies, HIV disease (followed by ID), history of hepatitis C, seizures (per chart review), and migraine headaches, who presents for his 4th botulinum toxin injection for intractable chronic migraines.  The patient is accompanied by his son today.   He had his 3rd injection on 10/22/2023, at which time he reported doing well with the injections he had no side effects and good effect from the injection.   Today, 01/29/24: He reports doing well with the injections, no side effects noted.  Botox continues to be effective, has had some flareup in his headaches in the past couple of weeks he feels.    Written informed consent for recurrent, 3 monthly intramuscular injections with botulinum toxin for this indication has been obtained and will be scanned into the patient's electronic chart.   I talked to the patient in detail about expectations, limitations, benefits as well as potential adverse effects of botulinum toxin injections. The patient understands that the side effects include (but are not limited to): Mouth dryness, dryness of eyes, speech and swallowing difficulties, respiratory depression or problems breathing, weakness of muscles including more distant muscles than the ones injected, flu-like symptoms, myalgias, injection site reactions such as redness, itching, swelling, pain, and infection. He has no history of neuromuscular disease such as Lambert-Eaton syndrome or myasthenia gravis.   200 units of botulinum toxin type A in the form of Botox were reconstituted using preservative-free normal saline to a concentration of 10 units per 0.1 mL and drawn up in two 1 mL tuberculin syringes. Lot number and expiration dates as below.   O/E: BP 123/76 (BP Location: Right Arm,  Patient Position: Sitting, Cuff Size: Normal)   Pulse 79   Ht 5\' 10"  (1.778 m)   Wt 172 lb (78 kg)   BMI 24.68 kg/m     The patient was situated in a chair, sitting comfortably. After preparing the areas with 70% isopropyl alcohol and using a 26 gauge 1 1/2 inch hollow lumen recording EMG needle for the neck injections as well as a 30 gauge 1 inch needle for the facial injections, a total dose of 155 units of botulinum toxin type A in the form of Botox was injected into the muscles and the following distribution and quantities:   #1: 10 units on the right and 10 units in the left frontalis muscles, broken down in 2 sites on each side. #2: 5 units in the right and 5 units in the left corrugator muscles. #3: 15 units in the right and 15 units in the left occipitalis muscles, broken down in 3 sites on each side. #4: 20 units in the right and 20 units in the left temporalis muscles, broken down in 4 sites on each side.  #5: 15 units on the right and 15 units in the left upper trapezius muscles, broken down in 3 sites on each side. #6: 10 units in the right and 10 units in the left splenius capitis muscles, broken down in 2 sites on each side.  #7: 2.5 units in the right and 2.5 units in the left procerus muscles.   EMG guidance was utilized for the neck injections with mild to moderate EMG activity noted in the bilateral upper splenius capitis areas.    A dose of 45 units out of a total dose of 200 units  was discarded as unavoidable waste.     The patient tolerated the procedure well without immediate complications. He was advised to make a followup appointment for repeat injections in 3 months from now and encouraged to call us with any interim questions, concerns, problems, or updates.  He was also advised to keep his follow-up appointment with his neurologist for ongoing medical management of his migraines as he has prescription medications through that office.  The hope is that when the Botox  kicks in and is continually helpful on a longer-term basis, that he can be tapered off of some of his medications over time.  He was in agreement and did not have any questions prior to leaving clinic today.   Botox- 200 units x 1 vial Lot: [id number] Expiration: 03/2026 NDC: 5409-8119-14  Bacteriostatic 0.9% Sodium Chloride- * mL  Lot: [id number] Expiration: 12/12/2023 NDC: 2130-8657-84  Dx: 43.709 B/B  Previously (copied from previous notes for reference):        He had his second injection on 07/18/23, at which time he reported doing well. Botox had been very helpful, no side effect were reported   He had his first Botulinum toxin injection on 04/11/2023, at which time he received 155 units of Botox.    I first met him at the request of his neurologist on 03/05/2023, at which time the patient reported chronic migraines.  His neurologist does not perform Botox injections.  The patient was advised to continue with his medical management and follow-up through his existing neurologist and we proceeded with Botox authorization for intractable migraines.   He has had migraine headaches for years and has tried and failed multiple abortive and preventive medications.      03/05/23: (He) reports a longstanding history of migraine headaches of about 10 years.  Triggers include not eating on a regular basis or not drinking enough water he admits.  Symptoms are associated with nausea and sometimes vomiting, photophobia, headaches are often in the neck area and bitemporal areas and may last up to 4 days.  He reports that most of the month he has a migraine headache or headache.  He takes over-the-counter ibuprofen, 800 mg as needed and Maxalt has been helpful per PCP prescription and he is also on Aimovig injections per PCP.  He has not yet started Ajovy injections as prescribed by you, he reports that he has not been contacted that the prior authorization has been approved.  He is encouraged to check with his  pharmacy.  He is unable to provide a list of medications he has tried and failed for migraine prevention or acute migraine management.  He reports that his PCP has prescribed the Aimovig and Maxalt.  It helps to go to bed and sleep in a dark room, sometimes taking Xanax helps as well.  He drinks caffeine in the form of coffee, 2 cups/day, 1 glass of tea, usually no daily soda, he admits to drinking only 2 to 3 glasses of water per day.  He does not drink any alcohol.  He is working on smoking cessation and currently smokes about 4 to 5 cigarettes/day. I reviewed your office note from 02/07/2023.  He has been on Aimovig injections as well as Maxalt as needed.  Of note, he is on multiple high risk medications as well as psychotropic medications, he was interested in pursuing Botox injections for his migraines.  He was referred for evaluation of Botox injections for chronic migraines.  He was advised to stop Aimovig  and start Ajovy at the time.    He recent eye examination about 3 weeks ago, he was given new contact lenses.  He works part-time in Aeronautical engineer.   He had previously also seen Dr. Beryle Beams in neurology for low back pain and fibromyalgia.  He had seen Dr. Stacy Gardner in February 2020 for migraine headaches and episode of shaking.      Botox- 200 units x 1 vial Lot: [id number] Expiration: 03/2026 NDC: 3875-6433-29  Bacteriostatic 0.9% Sodium Chloride- * mL  Lot: [id number] Expiration: 12/12/2023 NDC: 6606-3016-01  Dx: 29.709 B/B Witnessed by Alverda Skeans, RN

## 2024-01-29 NOTE — Patient Instructions (Signed)
Please remember, botulinum toxin takes about 3-7 days to kick in. As discussed, this is not a pain shot. The purpose of the injections is to gradually improve your symptoms. In some patients it takes up to 2-3 weeks to make a difference and it wears off with time. Sometimes it may wear off before it is time for the next injection. We still should wait till the next 3 monthly injection, because injecting too frequently may cause you to develop immunity to the botulinum toxin. We are looking for a reduction in the severity and/or frequency of your symptoms. As a reminder, side effects to look out for are (but not limited to): mouth dryness, dryness of the eyes, heaviness of your head or muscle weakness, including droopy face or droopy eyelid(s), rarely: speech or swallowing difficulties and very rarely: breathing difficulties. Some people have transient neck pain or soreness which typically responds to over-the-counter anti-inflammatory medication and local heat application with a heat pad. If you think you have a severe reaction to the botulinum toxin, such as weakness, trouble speaking, trouble breathing, or trouble swallowing, you have to call 911 or have someone take you to the nearest emergency room. However, most people have either no or minimal side effects from the injections. It is normal to have a little bit of redness and swelling around the injection sites which usually improves after a few hours. Rarely, there may be a bruise that improves on its own. Most side effects reported are very mild and resolve within 10-14 days. Please feel free to call us if you have any additional questions or concerns: [phone] or email us through My Chart. We may have to adjust the dose over time, depending on your results from this injection and your overall response over time to this medication.   

## 2024-02-08 ENCOUNTER — Other Ambulatory Visit: Payer: Self-pay | Admitting: Infectious Disease

## 2024-02-08 DIAGNOSIS — Z21 Asymptomatic human immunodeficiency virus [HIV] infection status: Secondary | ICD-10-CM

## 2024-02-11 ENCOUNTER — Other Ambulatory Visit (HOSPITAL_COMMUNITY): Payer: Self-pay

## 2024-02-11 MED ORDER — ATORVASTATIN CALCIUM 20 MG PO TABS: 20.0000 mg | ORAL_TABLET | Freq: Every day | ORAL | 0 refills | Status: DC

## 2024-02-11 NOTE — Telephone Encounter (Signed)
 1 year supply was sent 05/2023, patient should have refills on file.

## 2024-03-07 ENCOUNTER — Other Ambulatory Visit: Payer: Self-pay | Admitting: Infectious Disease

## 2024-03-07 DIAGNOSIS — Z21 Asymptomatic human immunodeficiency virus [HIV] infection status: Secondary | ICD-10-CM

## 2024-03-25 ENCOUNTER — Telehealth: Payer: Self-pay | Admitting: Neurology

## 2024-03-25 DIAGNOSIS — G43719 Chronic migraine without aura, intractable, without status migrainosus: Secondary | ICD-10-CM

## 2024-03-25 NOTE — Telephone Encounter (Signed)
 Renewed auth via [employer] portal, pt will continue to be B/B. 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

## 2024-03-28 ENCOUNTER — Other Ambulatory Visit: Payer: Self-pay | Admitting: Infectious Disease

## 2024-03-28 DIAGNOSIS — E785 Hyperlipidemia, unspecified: Secondary | ICD-10-CM

## 2024-03-28 DIAGNOSIS — B20 Human immunodeficiency virus [HIV] disease: Secondary | ICD-10-CM

## 2024-03-30 ENCOUNTER — Other Ambulatory Visit: Payer: Self-pay | Admitting: Infectious Disease

## 2024-03-30 DIAGNOSIS — Z21 Asymptomatic human immunodeficiency virus [HIV] infection status: Secondary | ICD-10-CM

## 2024-04-07 NOTE — Progress Notes (Deleted)
 Subjective:  Chief complaint: follow-up for HIV disease on medications   Patient ID: Randy Castillo, male    DOB: February 06, 1981, 43 y.o.   MRN: [id number]  HPI    Past Medical History:  Diagnosis Date   Anorexia 03/01/2020   Anxiety    Asthma    Bipolar affective disorder, manic, in partial or remission    Elevated PSA 11/14/2021   Environmental allergies    Gonorrhea in male    Hepatitis C virus infection resolved after antiviral drug therapy 05/03/2016   Herpes    HIV disease (HCC)    Hyperlipidemia 11/13/2022   Hypertension    Low testosterone  03/01/2020   Migraine    Neuropathic pain    Opioid dependence on agonist therapy (HCC)    PTSD (post-traumatic stress disorder)    Recurrent sinus infections 02/04/2019   Seizures (HCC)     Past Surgical History:  Procedure Laterality Date   CYST EXCISION     ESOPHAGOGASTRODUODENOSCOPY  2008   Dr. Nolene Baumgarten: Erosive reflux esophagitis, gastritis, small bowel biopsy negative for celiac, gastric biopsy negative for H. pylori    Family History  Problem Relation Age of Onset   Migraines Mother       Social History   Socioeconomic History   Marital status: Unknown    Spouse name: Not on file   Number of children: 1   Years of education: Not on file   Highest education level: Not on file  Occupational History   Not on file  Tobacco Use   Smoking status: Every Day    Current packs/day: 0.25    Average packs/day: 0.3 packs/day for 29.3 years (7.3 ttl pk-yrs)    Types: Cigarettes    Start date: 12/11/1994   Smokeless tobacco: Never   Tobacco comments:    Is vaping to help reduce cigarette use    Pt states takes THC   Vaping Use   Vaping status: Never Used  Substance and Sexual Activity   Alcohol use: No    Alcohol/week: 0.0 standard drinks of alcohol   Drug use: Not Currently    Types: Marijuana    Comment: drugs in mid-20s. intranasal/iv   Sexual activity: Not Currently    Partners: Female, Male    Birth  control/protection: Condom    Comment: pt. declined condoms  Other Topics Concern   Not on file  Social History Narrative   Not on file   Social Drivers of Health   Financial Resource Strain: Medium Risk (12/23/2023)   Received from [facility]   Overall Financial Resource Strain (CARDIA)    Difficulty of Paying Living Expenses: Somewhat hard  Food Insecurity: Food Insecurity Present (12/23/2023)   Received from [facility]   Hunger Vital Sign    Worried About Running Out of Food in the Last Year: Sometimes true    Ran Out of Food in the Last Year: Sometimes true  Transportation Needs: No Transportation Needs (12/23/2023)   Received from [facility] - Transportation    Lack of Transportation (Medical): No    Lack of Transportation (Non-Medical): No  Physical Activity: Insufficiently Active (12/23/2023)   Received from [facility]   Exercise Vital Sign    Days of Exercise per Week: 2 days    Minutes of Exercise per Session: 10 min  Stress: Stress Concern Present (12/23/2023)   Received from [facility] - Occupational Stress Questionnaire    Feeling  of Stress : Very much  Social Connections: Somewhat Isolated (12/23/2023)   Received from [facility]   Social Network    How would you rate your social network (family, work, friends)?: Restricted participation with some degree of social isolation    Allergies  Allergen Reactions   Zanaflex [Tizanidine Hcl]     Seizure   Diclofenac  Sodium Hives    Interaction with HIV medications    Naloxone Other (See Comments)    headache   Propoxyphene Nausea And Vomiting   Darvocet [Propoxyphene N-Acetaminophen ] Nausea And Vomiting     Current Outpatient Medications:    ALPRAZolam  (XANAX ) 1 MG tablet, Take 2 mg by mouth 3 (three) times daily., Disp: , Rfl:    amphetamine -dextroamphetamine  (ADDERALL) 20 MG tablet, Take 1 tablet (20 mg total) by mouth 4 (four) times daily.,  Disp: 12 tablet, Rfl: 0   atorvastatin  (LIPITOR) 20 MG tablet, Take 1 tablet (20 mg total) by mouth daily., Disp: 30 tablet, Rfl: 0   bictegravir-emtricitabine -tenofovir  AF (BIKTARVY ) 50-200-25 MG TABS tablet, Take 1 tablet by mouth daily. Try to take at the same time each day with or without food., Disp: 30 tablet, Rfl: 11   COMBIVENT  RESPIMAT 20-100 MCG/ACT AERS respimat, Inhale 1 puff into the lungs every 6 (six) hours as needed., Disp: 1 Inhaler, Rfl: 0   diclofenac  Sodium (VOLTAREN ) 1 % GEL, , Disp: , Rfl:    dronabinol  (MARINOL ) 5 MG capsule, Take 1 capsule (5 mg total) by mouth 2 (two) times daily before lunch and supper., Disp: 60 capsule, Rfl: 3   finasteride (PROPECIA) 1 MG tablet, Take 1 mg by mouth daily., Disp: , Rfl:    fluticasone  (FLONASE ) 50 MCG/ACT nasal spray, USE 2 SPRAYS IEN D, Disp: 16 g, Rfl: 0   Lumateperone Tosylate (CAPLYTA) 21 MG CAPS, Take by mouth., Disp: , Rfl:    montelukast  (SINGULAIR ) 10 MG tablet, TK 1 T PO QHS, Disp: 3 tablet, Rfl: 9   NICOTINE  STEP 1 21 MG/24HR patch, Place 1 patch (21 mg total) onto the skin daily., Disp: 3 patch, Rfl: 0   OLANZapine -FLUoxetine  HCl (SYMBYAX  PO), Take by mouth., Disp: , Rfl:    omeprazole  (PRILOSEC) 20 MG capsule, Take 20 mg by mouth daily., Disp: , Rfl:    ondansetron  (ZOFRAN -ODT) 8 MG disintegrating tablet, Take 1 tablet (8 mg total) by mouth 4 (four) times daily as needed for nausea or vomiting., Disp: 30 tablet, Rfl: 5   pantoprazole  (PROTONIX ) 40 MG tablet, , Disp: , Rfl:    rizatriptan  (MAXALT ) 10 MG tablet, , Disp: , Rfl:    Study - R6045 - hepatitis B recombinant vaccine (ENGERIX-B ) IM injection 20 mcg/mL ([place]), Inject in the deltoid muscle.  [id number] ACTG [id number], Disp: 1 mL, Rfl: 0   Review of Systems     Objective:   Physical Exam        Assessment & Plan:

## 2024-04-08 ENCOUNTER — Ambulatory Visit: Admitting: Infectious Disease

## 2024-04-08 DIAGNOSIS — E785 Hyperlipidemia, unspecified: Secondary | ICD-10-CM

## 2024-04-08 DIAGNOSIS — B20 Human immunodeficiency virus [HIV] disease: Secondary | ICD-10-CM

## 2024-04-19 NOTE — Progress Notes (Unsigned)
 Chief complaint: follow-up for HIV disease on medications  Subjective:    Patient ID: Randy Castillo, male    DOB: March 06, 1981, 43 y.o.   MRN: [id number]  HPI  Discussed the use of AI scribe software for clinical note transcription with the patient, who gave verbal consent to proceed.  History of Present Illness   Randy Castillo is a 43 year old male with a history of HIV, ADHD, OUD, nausea, depression, and more recently prostate infection and enlarged prostate who presents for routine HIV care.  He has ongoing prostate issues with elevated prostate-specific antigen levels and is scheduled for a prostate biopsy in two weeks. He has a history of a prostate infection in 2021, which has resulted in an enlarged prostate. Note he had been on testosterone  int he past but is no longer on this  He experiences worsening nausea, particularly in the mornings, over the past year. He is currently on Marinol  to help with appetite and nausea. Oxycodone , used for pain management, could also contribute to nausea.  Current medications include Lipitor, chronic opiates, and Adderall.   He denies recent sexual activity and has a history of receptive anal intercourse,.    His depression is stable and managed with regular counseling.      Past Medical History:  Diagnosis Date   Anorexia 03/01/2020   Anxiety    Asthma    Bipolar affective disorder, manic, in partial or remission    Elevated PSA 11/14/2021   Environmental allergies    Gonorrhea in male    Hepatitis C virus infection resolved after antiviral drug therapy 05/03/2016   Herpes    HIV disease (HCC)    Hyperlipidemia 11/13/2022   Hypertension    Low testosterone  03/01/2020   Migraine    Neuropathic pain    Opioid dependence on agonist therapy (HCC)    PTSD (post-traumatic stress disorder)    Recurrent sinus infections 02/04/2019   Seizures (HCC)     Past Surgical History:  Procedure Laterality Date   CYST EXCISION      ESOPHAGOGASTRODUODENOSCOPY  2008   Dr. Nolene Baumgarten: Erosive reflux esophagitis, gastritis, small bowel biopsy negative for celiac, gastric biopsy negative for H. pylori    Family History  Problem Relation Age of Onset   Migraines Mother       Social History   Socioeconomic History   Marital status: Unknown    Spouse name: Not on file   Number of children: 1   Years of education: Not on file   Highest education level: Not on file  Occupational History   Not on file  Tobacco Use   Smoking status: Every Day    Current packs/day: 0.25    Average packs/day: 0.3 packs/day for 29.4 years (7.3 ttl pk-yrs)    Types: Cigarettes    Start date: 12/11/1994   Smokeless tobacco: Never   Tobacco comments:    Is vaping to help reduce cigarette use    Pt states takes THC   Vaping Use   Vaping status: Never Used  Substance and Sexual Activity   Alcohol use: No    Alcohol/week: 0.0 standard drinks of alcohol   Drug use: Not Currently    Types: Marijuana    Comment: drugs in mid-20s. intranasal/iv   Sexual activity: Not Currently    Partners: Female, Male    Birth control/protection: Condom    Comment: pt. declined condoms  Other Topics Concern   Not on file  Social History Narrative  Not on file   Social Drivers of Health   Financial Resource Strain: Medium Risk (12/23/2023)   Received from [facility]   Overall Financial Resource Strain (CARDIA)    Difficulty of Paying Living Expenses: Somewhat hard  Food Insecurity: Food Insecurity Present (12/23/2023)   Received from [facility]   Hunger Vital Sign    Worried About Running Out of Food in the Last Year: Sometimes true    Ran Out of Food in the Last Year: Sometimes true  Transportation Needs: No Transportation Needs (12/23/2023)   Received from [facility]   PRAPARE - Transportation    Lack of Transportation (Medical): No    Lack of Transportation (Non-Medical): No  Physical Activity: Insufficiently Active (12/23/2023)    Received from [facility]   Exercise Vital Sign    Days of Exercise per Week: 2 days    Minutes of Exercise per Session: 10 min  Stress: Stress Concern Present (12/23/2023)   Received from [facility] - Occupational Stress Questionnaire    Feeling of Stress : Very much  Social Connections: Somewhat Isolated (12/23/2023)   Received from [facility]   Social Network    How would you rate your social network (family, work, friends)?: Restricted participation with some degree of social isolation    Allergies  Allergen Reactions   Zanaflex [Tizanidine Hcl]     Seizure   Diclofenac  Sodium Hives    Interaction with HIV medications    Naloxone Other (See Comments)    headache   Propoxyphene Nausea And Vomiting   Darvocet [Propoxyphene N-Acetaminophen ] Nausea And Vomiting     Current Outpatient Medications:    ALPRAZolam  (XANAX ) 1 MG tablet, Take 2 mg by mouth 3 (three) times daily., Disp: , Rfl:    amphetamine -dextroamphetamine  (ADDERALL) 20 MG tablet, Take 1 tablet (20 mg total) by mouth 4 (four) times daily., Disp: 12 tablet, Rfl: 0   atorvastatin  (LIPITOR) 20 MG tablet, Take 1 tablet (20 mg total) by mouth daily., Disp: 30 tablet, Rfl: 0   bictegravir-emtricitabine -tenofovir  AF (BIKTARVY ) 50-200-25 MG TABS tablet, Take 1 tablet by mouth daily. Try to take at the same time each day with or without food., Disp: 30 tablet, Rfl: 11   COMBIVENT  RESPIMAT 20-100 MCG/ACT AERS respimat, Inhale 1 puff into the lungs every 6 (six) hours as needed., Disp: 1 Inhaler, Rfl: 0   diclofenac  Sodium (VOLTAREN ) 1 % GEL, , Disp: , Rfl:    dronabinol  (MARINOL ) 5 MG capsule, Take 1 capsule (5 mg total) by mouth 2 (two) times daily before lunch and supper., Disp: 60 capsule, Rfl: 3   finasteride (PROPECIA) 1 MG tablet, Take 1 mg by mouth daily., Disp: , Rfl:    fluticasone  (FLONASE ) 50 MCG/ACT nasal spray, USE 2 SPRAYS IEN D, Disp: 16 g, Rfl: 0   Lumateperone  Tosylate (CAPLYTA) 21 MG CAPS, Take by mouth., Disp: , Rfl:    montelukast  (SINGULAIR ) 10 MG tablet, TK 1 T PO QHS, Disp: 3 tablet, Rfl: 9   NICOTINE  STEP 1 21 MG/24HR patch, Place 1 patch (21 mg total) onto the skin daily., Disp: 3 patch, Rfl: 0   OLANZapine -FLUoxetine  HCl (SYMBYAX  PO), Take by mouth., Disp: , Rfl:    omeprazole  (PRILOSEC) 20 MG capsule, Take 20 mg by mouth daily., Disp: , Rfl:    ondansetron  (ZOFRAN -ODT) 8 MG disintegrating tablet, Take 1 tablet (8 mg total) by mouth 4 (four) times daily as needed for nausea or vomiting.,  Disp: 30 tablet, Rfl: 5   pantoprazole  (PROTONIX ) 40 MG tablet, , Disp: , Rfl:    rizatriptan  (MAXALT ) 10 MG tablet, , Disp: , Rfl:    Study - [id number] - hepatitis B recombinant vaccine (ENGERIX-B ) IM injection 20 mcg/mL (PI-[place]), Inject in the deltoid muscle.  [id number], Disp: 1 mL, Rfl: 0   Review of Systems  Constitutional:  Negative for activity change, appetite change, chills, diaphoresis, fatigue, fever and unexpected weight change.  HENT:  Negative for congestion, rhinorrhea, sinus pressure, sneezing, sore throat and trouble swallowing.   Eyes:  Negative for photophobia and visual disturbance.  Respiratory:  Negative for cough, chest tightness, shortness of breath, wheezing and stridor.   Cardiovascular:  Negative for chest pain, palpitations and leg swelling.  Gastrointestinal:  Negative for abdominal distention, abdominal pain, anal bleeding, blood in stool, constipation, diarrhea, nausea and vomiting.  Genitourinary:  Negative for difficulty urinating, dysuria, flank pain and hematuria.  Musculoskeletal:  Negative for arthralgias, back pain, gait problem, joint swelling and myalgias.  Skin:  Negative for color change, pallor, rash and wound.  Neurological:  Negative for dizziness, tremors, weakness and light-headedness.  Hematological:  Negative for adenopathy. Does not bruise/bleed easily.  Psychiatric/Behavioral:  Negative  for agitation, behavioral problems, confusion, decreased concentration, dysphoric mood and sleep disturbance.        Objective:   Physical Exam Constitutional:      Appearance: He is well-developed.  HENT:     Head: Normocephalic and atraumatic.  Eyes:     Conjunctiva/sclera: Conjunctivae normal.  Cardiovascular:     Rate and Rhythm: Normal rate and regular rhythm.  Pulmonary:     Effort: Pulmonary effort is normal. No respiratory distress.     Breath sounds: No wheezing.  Abdominal:     General: There is no distension.     Palpations: Abdomen is soft.  Musculoskeletal:        General: No tenderness. Normal range of motion.     Cervical back: Normal range of motion and neck supple.  Skin:    General: Skin is warm and dry.     Coloration: Skin is not pale.     Findings: No erythema or rash.  Neurological:     General: No focal deficit present.     Mental Status: He is alert and oriented to person, place, and time.  Psychiatric:        Mood and Affect: Mood normal.        Behavior: Behavior normal.        Thought Content: Thought content normal.        Judgment: Judgment normal.           Assessment & Plan:    Assessment and Plan    HIV disease: check HIV RNA, CD4,. routine labs send in rx for Biktarvy      Prostate infection with enlarged prostate Prostate infection in 2021 led to enlarged prostate. Elevated PSA levels despite Propecia use. Biopsy scheduled to rule out malignancy. Discussed potential risk of prostate cancer fueled by testosterone , emphasizing early detection and treatment. - Proceed with scheduled prostate biopsy in two weeks.  Nausea Worsening nausea, particularly in the mornings. Possible contributing factors include Marinol  and oxycodone  use. Discussed cyclic hyperemesis syndrome related to marijuana use, though not definitively linked to Marinol . - defer to PCP and or GI if needed  Chronic pain management Chronic pain managed by pain  management specialist, Dr. Yvonnie Heritage. Current medications include oxycodone  and Marinol   for appetite and nausea. - Continue current pain management regimen under Dr. Jennell Moccasin supervision.   ADHD: on adderal   Rectal cancer screening: anal pap smear performed today

## 2024-04-21 ENCOUNTER — Other Ambulatory Visit (HOSPITAL_COMMUNITY)
Admission: RE | Admit: 2024-04-21 | Discharge: 2024-04-21 | Disposition: A | Discharge status: Home / Self Care | Source: Ambulatory Visit | Attending: Infectious Disease | Admitting: Infectious Disease

## 2024-04-21 ENCOUNTER — Encounter: Payer: Self-pay | Admitting: Infectious Disease

## 2024-04-21 ENCOUNTER — Other Ambulatory Visit: Payer: Self-pay

## 2024-04-21 ENCOUNTER — Ambulatory Visit (INDEPENDENT_AMBULATORY_CARE_PROVIDER_SITE_OTHER): Admitting: Infectious Disease

## 2024-04-21 VITALS — BP 107/70 | HR 71 | Temp 98.2°F | Ht 71.0 in | Wt 170.0 lb

## 2024-04-21 DIAGNOSIS — B20 Human immunodeficiency virus [HIV] disease: Secondary | ICD-10-CM | POA: Insufficient documentation

## 2024-04-21 DIAGNOSIS — E785 Hyperlipidemia, unspecified: Secondary | ICD-10-CM | POA: Insufficient documentation

## 2024-04-21 DIAGNOSIS — R972 Elevated prostate specific antigen [PSA]: Secondary | ICD-10-CM

## 2024-04-21 DIAGNOSIS — F1190 Opioid use, unspecified, uncomplicated: Secondary | ICD-10-CM

## 2024-04-21 DIAGNOSIS — G8929 Other chronic pain: Secondary | ICD-10-CM

## 2024-04-21 DIAGNOSIS — N400 Enlarged prostate without lower urinary tract symptoms: Secondary | ICD-10-CM | POA: Diagnosis not present

## 2024-04-21 DIAGNOSIS — R112 Nausea with vomiting, unspecified: Secondary | ICD-10-CM

## 2024-04-21 DIAGNOSIS — Z79891 Long term (current) use of opiate analgesic: Secondary | ICD-10-CM

## 2024-04-21 DIAGNOSIS — F17200 Nicotine dependence, unspecified, uncomplicated: Secondary | ICD-10-CM

## 2024-04-21 DIAGNOSIS — Z21 Asymptomatic human immunodeficiency virus [HIV] infection status: Secondary | ICD-10-CM

## 2024-04-21 DIAGNOSIS — B9689 Other specified bacterial agents as the cause of diseases classified elsewhere: Secondary | ICD-10-CM | POA: Diagnosis not present

## 2024-04-21 MED ORDER — BICTEGRAVIR-EMTRICITAB-TENOFOV 50-200-25 MG PO TABS: 1.0000 | ORAL_TABLET | Freq: Every day | ORAL | 11 refills | Status: AC

## 2024-04-21 MED ORDER — ATORVASTATIN CALCIUM 20 MG PO TABS: 20.0000 mg | ORAL_TABLET | Freq: Every day | ORAL | 0 refills | Status: DC

## 2024-04-21 NOTE — Patient Instructions (Signed)
 Smoking Cessation: QuitlineNC 1-800-QUIT-NOW 262-336-8188); Espaol: 1-855-Djelo-Ya (9-811-914-7829) [email or web address].[email or web address]     Mental Health Resources  988: can call or text 24/7  [facility]: Address: [address], [place], [place] [address] Open 24 hours Phone: [phone]  [facility] [place]: Address: [address] [place], [place] [address] Phone: [phone] Appointments: [email or web address]

## 2024-04-22 LAB — T-HELPER CELLS (CD4) COUNT (NOT AT ARMC)
CD4 % Helper T Cell: 40 % (ref 33–65)
CD4 T Cell Abs: 922 /uL (ref 400–1790)

## 2024-04-23 ENCOUNTER — Ambulatory Visit (INDEPENDENT_AMBULATORY_CARE_PROVIDER_SITE_OTHER): Payer: 59 | Admitting: Neurology

## 2024-04-23 ENCOUNTER — Encounter: Payer: Self-pay | Admitting: Neurology

## 2024-04-23 VITALS — BP 127/94 | HR 93 | Ht 71.0 in | Wt 174.4 lb

## 2024-04-23 DIAGNOSIS — G43719 Chronic migraine without aura, intractable, without status migrainosus: Secondary | ICD-10-CM | POA: Diagnosis not present

## 2024-04-23 MED ORDER — ONABOTULINUMTOXINA 200 UNITS IJ SOLR
155.0000 [IU] | Freq: Once | INTRAMUSCULAR | Status: AC
  Administered 2024-04-23: 155 [IU] via INTRAMUSCULAR

## 2024-04-23 NOTE — Progress Notes (Signed)
 Botox - 200 units x 1 vial Lot: [id number] Expiration: 09/2026 NDC: 5409-8119-14  Bacteriostatic 0.9% Sodium Chloride - 2 mL  Lot: [id number] Expiration: 10/11/2024 NDC: 2130-8657-84  Dx: O96.295 B/B Witnessed by Inocencio Mania

## 2024-04-23 NOTE — Progress Notes (Signed)
 The 10-year ASCVD risk score (Arnett DK, et al., 2019) is: 1.7%   Values used to calculate the score:     Age: 43 years     Sex: Male     Is Non-Hispanic African American: No     Diabetic: No     Tobacco smoker: Yes     Systolic Blood Pressure: 107 mmHg     Is BP treated: No     HDL Cholesterol: 44 mg/dL     Total Cholesterol: 130 mg/dL  Arlon Bergamo, BSN, RN

## 2024-04-23 NOTE — Progress Notes (Signed)
 Subjective:    Patient ID: Randy Castillo is a 43 y.o. male.  HPI   Randy Castillo is a 43 year old male with an underlying complex medical history of hypertension, hyperlipidemia, low testosterone , anxiety, mood disorder, chronic pain, on chronic narcotic pain medication, PTSD (per chart review), allergies, HIV disease (followed by ID), history of hepatitis C, seizures (per chart review), and migraine headaches, who presents for his 5th botulinum toxin injection for intractable chronic migraines.  The patient is unaccompanied today.  He had his 4th injection on 01/29/2024, at which time he reported ongoing good results with Botox  injections without any side effects.  He had noticed breakthrough headaches in the couple of weeks being helped to the injection.    Today, 04/22/24: He reports doing well with the injections, no side effects noted.  Botox  continues to be effective, has had practically no breakthrough migraines during this time, with the exception for the last weekend.  He reports a knot in the back of his head, I palpated the posterior neck area where he was pointing, no lymph node palpated and no obvious injection or palpable mass.  No pain with palpation.      Written informed consent for recurrent, 3 monthly intramuscular injections with botulinum toxin for this indication has been obtained and will be scanned into the patient's electronic chart.   I talked to the patient in detail about expectations, limitations, benefits as well as potential adverse effects of botulinum toxin injections. The patient understands that the side effects include (but are not limited to): Mouth dryness, dryness of eyes, speech and swallowing difficulties, respiratory depression or problems breathing, weakness of muscles including more distant muscles than the ones injected, flu-like symptoms, myalgias, injection site reactions such as redness, itching, swelling, pain, and infection. He has no history of  neuromuscular disease such as Lambert-Eaton syndrome or myasthenia gravis.   200 units of botulinum toxin type A  in the form of Botox  were reconstituted using preservative-free normal saline to a concentration of 10 units per 0.1 mL and drawn up in two 1 mL tuberculin syringes. Lot number and expiration dates as below.   O/E: BP (!) 127/94   Pulse 93   Ht 5\' 11"  (1.803 m)   Wt 174 lb 6.4 oz (79.1 kg)   BMI 24.32 kg/m     The patient was situated in a chair, sitting comfortably. After preparing the areas with 70% isopropyl alcohol and using a 26 gauge 1 1/2 inch hollow lumen recording EMG needle for the neck injections as well as a 30 gauge 1 inch needle for the facial injections, a total dose of 155 units of botulinum toxin type A  in the form of Botox  was injected into the muscles and the following distribution and quantities:   #1: 10 units on the right and 10 units in the left frontalis muscles, broken down in 2 sites on each side. #2: 5 units in the right and 5 units in the left corrugator muscles. #3: 15 units in the right and 15 units in the left occipitalis muscles, broken down in 3 sites on each side. #4: 20 units in the right and 20 units in the left temporalis muscles, broken down in 4 sites on each side.  #5: 15 units on the right and 15 units in the left upper trapezius muscles, broken down in 3 sites on each side. #6: 10 units in the right and 10 units in the left splenius capitis muscles, broken down in 2 sites  on each side.  #7: 2.5 units in the right and 2.5 units in the left procerus muscles.   EMG guidance was utilized for the neck injections with mild to moderate EMG activity noted in the bilateral upper splenius capitis areas and upper trapezius area bilaterally.     A dose of 45 units out of a total dose of 200 units was discarded as unavoidable waste.     The patient tolerated the procedure well without immediate complications. He was advised to make a follow-up  appointment for repeat injections in 3 months from now and encouraged to call us  with any interim questions, concerns, problems, or updates.  He was also advised to keep his follow-up appointment with his neurologist for ongoing medical management of his migraines as he has prescription medications through that office.  The hope is that when the Botox  kicks in and is continually helpful on a longer-term basis, that he can be tapered off of some of his medications over time.  He was in agreement and did not have any questions prior to leaving clinic today.   Botox - 200 units x 1 vial Lot: [id number] Expiration: 09/2026 NDC: 1914-7829-56   Bacteriostatic 0.9% Sodium Chloride - 2 mL  Lot: [id number] Expiration: 10/11/2024 NDC: 5784-6962-95   Dx: M84.132 B/B Witnessed by Inocencio Mania    Previously (copied from previous notes for reference):    He had his 3rd injection on 10/22/2023, at which time he reported doing well with the injections he had no side effects and good effect from the injection.   He had his second injection on 07/18/23, at which time he reported doing well. Botox  had been very helpful, no side effect were reported    He had his first Botulinum toxin injection on 04/11/2023, at which time he received 155 units of Botox .    I first met him at the request of his neurologist on 03/05/2023, at which time the patient reported chronic migraines.  His neurologist does not perform Botox  injections.  The patient was advised to continue with his medical management and follow-up through his existing neurologist and we proceeded with Botox  authorization for intractable migraines.   He has had migraine headaches for years and has tried and failed multiple abortive and preventive medications.      03/05/23: (He) reports a longstanding history of migraine headaches of about 10 years.  Triggers include not eating on a regular basis or not drinking enough water he admits.  Symptoms are associated with  nausea and sometimes vomiting, photophobia, headaches are often in the neck area and bitemporal areas and may last up to 4 days.  He reports that most of the month he has a migraine headache or headache.  He takes over-the-counter ibuprofen, 800 mg as needed and Maxalt  has been helpful per PCP prescription and he is also on Aimovig  injections per PCP.  He has not yet started Ajovy injections as prescribed by you, he reports that he has not been contacted that the prior authorization has been approved.  He is encouraged to check with his pharmacy.  He is unable to provide a list of medications he has tried and failed for migraine prevention or acute migraine management.  He reports that his PCP has prescribed the Aimovig  and Maxalt .  It helps to go to bed and sleep in a dark room, sometimes taking Xanax  helps as well.  He drinks caffeine in the form of coffee, 2 cups/day, 1 glass of tea, usually no daily soda,  he admits to drinking only 2 to 3 glasses of water per day.  He does not drink any alcohol.  He is working on smoking cessation and currently smokes about 4 to 5 cigarettes/day. I reviewed your office note from 02/07/2023.  He has been on Aimovig  injections as well as Maxalt  as needed.  Of note, he is on multiple high risk medications as well as psychotropic medications, he was interested in pursuing Botox  injections for his migraines.  He was referred for evaluation of Botox  injections for chronic migraines.  He was advised to stop Aimovig  and start Ajovy at the time.    He recent eye examination about 3 weeks ago, he was given new contact lenses.  He works part-time in Aeronautical engineer.   He had previously also seen Dr. Jeneen Mire in neurology for low back pain and fibromyalgia.  He had seen Dr. Annamary Barrio in February 2020 for migraine headaches and episode of shaking.        Botox - 200 units x 1 vial Lot: [id number] Expiration: 09/2026 NDC: 4782-9562-13   Bacteriostatic 0.9% Sodium Chloride - 2 mL   Lot: [id number] Expiration: 10/11/2024 NDC: 4696-2952-84   Dx: X32.440 B/B Witnessed by Inocencio Mania      Review of Systems  Neurological:        Room 5 Botox  Pt is here Alone. Pt states that Sunday,Monday,and Tuesday were rough with her migraines. Pt states that he has a knot on the back of his head and wants to know if it is migraine related.

## 2024-04-24 ENCOUNTER — Other Ambulatory Visit: Payer: Self-pay | Admitting: Infectious Disease

## 2024-04-24 ENCOUNTER — Ambulatory Visit: Payer: Self-pay

## 2024-04-24 DIAGNOSIS — R85610 Atypical squamous cells of undetermined significance on cytologic smear of anus (ASC-US): Secondary | ICD-10-CM

## 2024-04-24 LAB — COMPLETE METABOLIC PANEL WITHOUT GFR
AG Ratio: 1.8 (calc) (ref 1.0–2.5)
ALT: 15 U/L (ref 9–46)
AST: 19 U/L (ref 10–40)
Albumin: 4.2 g/dL (ref 3.6–5.1)
Alkaline phosphatase (APISO): 67 U/L (ref 36–130)
BUN: 17 mg/dL (ref 7–25)
CO2: 31 mmol/L (ref 20–32)
Calcium: 9.2 mg/dL (ref 8.6–10.3)
Chloride: 105 mmol/L (ref 98–110)
Creat: 0.93 mg/dL (ref 0.60–1.29)
Globulin: 2.4 g/dL (ref 1.9–3.7)
Glucose, Bld: 83 mg/dL (ref 65–99)
Potassium: 4.4 mmol/L (ref 3.5–5.3)
Sodium: 140 mmol/L (ref 135–146)
Total Bilirubin: 0.3 mg/dL (ref 0.2–1.2)
Total Protein: 6.6 g/dL (ref 6.1–8.1)

## 2024-04-24 LAB — LIPID PANEL
Cholesterol: 130 mg/dL (ref <200)
HDL: 44 mg/dL (ref >=40)
LDL Cholesterol (Calc): 69 mg/dL
Non-HDL Cholesterol (Calc): 86 mg/dL (ref <130)
Total CHOL/HDL Ratio: 3 (calc) (ref <5.0)
Triglycerides: 88 mg/dL (ref <150)

## 2024-04-24 LAB — HIV-1 RNA QUANT-NO REFLEX-BLD
HIV 1 RNA Quant: NOT DETECTED {copies}/mL
HIV-1 RNA Quant, Log: NOT DETECTED {Log_copies}/mL

## 2024-04-24 LAB — CBC WITH DIFFERENTIAL/PLATELET
Absolute Lymphocytes: 2628 {cells}/uL (ref 850–3900)
Absolute Monocytes: 518 {cells}/uL (ref 200–950)
Basophils Absolute: 29 {cells}/uL (ref 0–200)
Basophils Relative: 0.4 %
Eosinophils Absolute: 187 {cells}/uL (ref 15–500)
Eosinophils Relative: 2.6 %
HCT: 43.9 % (ref 38.5–50.0)
Hemoglobin: 14.7 g/dL (ref 13.2–17.1)
MCH: 31.5 pg (ref 27.0–33.0)
MCHC: 33.5 g/dL (ref 32.0–36.0)
MCV: 94.2 fL (ref 80.0–100.0)
MPV: 11.3 fL (ref 7.5–12.5)
Monocytes Relative: 7.2 %
Neutro Abs: 3838 {cells}/uL (ref 1500–7800)
Neutrophils Relative %: 53.3 %
Platelets: 234 10*3/uL (ref 140–400)
RBC: 4.66 10*6/uL (ref 4.20–5.80)
RDW: 13.1 % (ref 11.0–15.0)
Total Lymphocyte: 36.5 %
WBC: 7.2 10*3/uL (ref 3.8–10.8)

## 2024-04-24 LAB — T PALLIDUM AB: T Pallidum Abs: POSITIVE — AB

## 2024-04-24 LAB — CYTOLOGY - PAP
Comment: NEGATIVE
Diagnosis: UNDETERMINED — AB
High risk HPV: POSITIVE — AB

## 2024-04-24 LAB — RPR: RPR Ser Ql: REACTIVE — AB

## 2024-04-24 LAB — RPR TITER: RPR Titer: 1:1 {titer} — ABNORMAL HIGH

## 2024-04-25 NOTE — Telephone Encounter (Signed)
-----   Message from Anthem sent at 04/24/2024  4:16 PM EDT ----- Anal pap with ascus so I have referred to [facility] for high resolution anoscopy ----- Message ----- From: Addison Holster Lab Results In Sent: 04/21/2024   3:29 PM EDT To: Charolette Copier, MD

## 2024-04-25 NOTE — Telephone Encounter (Signed)
 Patient aware of results and referral to general surgery. Provided number for patient to schedule if he doesn't hear back within a week.    Patrich Heinze Roann Chestnut, CMA

## 2024-05-07 ENCOUNTER — Ambulatory Visit: Admitting: Infectious Disease

## 2024-05-07 NOTE — Telephone Encounter (Signed)
 Patient called to request phone number for [facility]. States he called them to set up an appointment previously, but had to leave a message and has not heard back from them yet. Provided him with their number, he will call again to follow up on [facility] referral.   Randy Castillo, BSN, RN

## 2024-05-07 NOTE — Telephone Encounter (Signed)
 Patient called back, states [facility] does not have a referral on file for him. Spoke with Daivd Dub, referral coordinator, she will re-fax referral to [facility]. Patient notified.   Shakura Cowing, BSN, RN

## 2024-05-26 ENCOUNTER — Ambulatory Visit: Payer: Self-pay | Admitting: General Surgery

## 2024-05-26 NOTE — H&P (Signed)
 REFERRING PHYSICIAN:  Sheela Denmark  PROVIDER:  Denese Finn, MD  MRN: [id number] DOB: 09-25-1981 DATE OF ENCOUNTER: 05/26/2024  Subjective   Chief Complaint: New Consultation     History of Present Illness: Randy Castillo is a 43 y.o. male who is seen today as an office consultation at the request of Dr. Jacquelin Matin for evaluation of New Consultation .   Pt with well controlled HIV.  Recent Pap test shows ASCUS   Review of Systems: A complete review of systems was obtained from the patient.  I have reviewed this information and discussed as appropriate with the patient.  See HPI as well for other ROS.   Medical History: Past Medical History:  Diagnosis Date   HIV -AIDS with opportunistic infection, Symptomatic (CMS/HHS-HCC)     There is no problem list on file for this patient.   History reviewed. No pertinent surgical history.   No Known Allergies  No current outpatient medications on file prior to visit.   No current facility-administered medications on file prior to visit.    History reviewed. No pertinent family history.   Social History   Tobacco Use  Smoking Status Not on file  Smokeless Tobacco Not on file     Social History   Socioeconomic History   Marital status: Unknown   Social Drivers of Health   Financial Resource Strain: Medium Risk (12/23/2023)   Received from [facility]   Overall Financial Resource Strain (CARDIA)    Difficulty of Paying Living Expenses: Somewhat hard  Food Insecurity: Food Insecurity Present (12/23/2023)   Received from [facility]   Hunger Vital Sign    Within the past 12 months, you worried that your food would run out before you got the money to buy more.: Sometimes true    Within the past 12 months, the food you bought just didn't last and you didn't have money to get more.: Sometimes true  Transportation Needs: No Transportation Needs (12/23/2023)   Received from [facility] -  Transportation    Lack of Transportation (Medical): No    Lack of Transportation (Non-Medical): No  Physical Activity: Insufficiently Active (12/23/2023)   Received from [facility]   Exercise Vital Sign    On average, how many days per week do you engage in moderate to strenuous exercise (like a brisk walk)?: 2 days    On average, how many minutes do you engage in exercise at this level?: 10 min  Stress: Stress Concern Present (12/23/2023)   Received from [facility] - Occupational Stress Questionnaire    Feeling of Stress : Very much  Social Connections: Somewhat Isolated (12/23/2023)   Received from [facility]   Social Network    How would you rate your social network (family, work, friends)?: Restricted participation with some degree of social isolation  Housing Stability: Low Risk  (12/23/2023)   Received from [facility] Stability Vital Sign    In the last 12 months, was there a time when you were not able to pay the mortgage or rent on time?: No    In the past 12 months, how many times have you moved where you were living?: 0    At any time in the past 12 months, were you homeless or living in a shelter (including now)?: No    Objective:    Vitals:   05/26/24 1129 05/26/24 1130  BP: 122/88  Pulse: 96   Temp: 37 C (98.6 F)   SpO2: 97%   Weight: 78.2 kg (172 lb 6.4 oz)   Height: 181.6 cm (5' 11.5)   PainSc:  0-No pain     Exam Gen: NAD Abd: soft   Labs, Imaging and Diagnostic Testing: HIV <20 CD4 920  Cytology: ASCUS, positive HR HPV  Assessment and Plan:  Diagnoses and all orders for this visit:  Pap smear of anus with ASCUS   I have recommended HRA with possible biopsy and ablation.  We discussed risks of bleeding, pain and recurrence.  All questions were answered.  Pt would like to proceed as soon as possible  Fernande Howells, MD Colon and Rectal Surgery [facility]

## 2024-07-02 ENCOUNTER — Other Ambulatory Visit: Payer: Self-pay | Admitting: Infectious Disease

## 2024-07-02 DIAGNOSIS — E785 Hyperlipidemia, unspecified: Secondary | ICD-10-CM

## 2024-07-02 DIAGNOSIS — B20 Human immunodeficiency virus [HIV] disease: Secondary | ICD-10-CM

## 2024-07-03 ENCOUNTER — Encounter (HOSPITAL_BASED_OUTPATIENT_CLINIC_OR_DEPARTMENT_OTHER): Payer: Self-pay | Admitting: General Surgery

## 2024-07-10 ENCOUNTER — Ambulatory Visit (HOSPITAL_BASED_OUTPATIENT_CLINIC_OR_DEPARTMENT_OTHER)
Admission: RE | Admit: 2024-07-10 | Discharge: 2024-07-10 | Disposition: A | Discharge status: Home / Self Care | Attending: General Surgery | Admitting: General Surgery

## 2024-07-10 ENCOUNTER — Ambulatory Visit (HOSPITAL_BASED_OUTPATIENT_CLINIC_OR_DEPARTMENT_OTHER): Admitting: Anesthesiology

## 2024-07-10 ENCOUNTER — Encounter (HOSPITAL_BASED_OUTPATIENT_CLINIC_OR_DEPARTMENT_OTHER): Payer: Self-pay | Admitting: General Surgery

## 2024-07-10 ENCOUNTER — Other Ambulatory Visit: Payer: Self-pay

## 2024-07-10 ENCOUNTER — Encounter (HOSPITAL_BASED_OUTPATIENT_CLINIC_OR_DEPARTMENT_OTHER)
Admission: RE | Disposition: A | Discharge status: Home / Self Care | Payer: Self-pay | Source: Home / Self Care | Attending: General Surgery

## 2024-07-10 ENCOUNTER — Encounter (HOSPITAL_BASED_OUTPATIENT_CLINIC_OR_DEPARTMENT_OTHER): Admitting: Anesthesiology

## 2024-07-10 DIAGNOSIS — R519 Headache, unspecified: Secondary | ICD-10-CM | POA: Diagnosis not present

## 2024-07-10 DIAGNOSIS — F17200 Nicotine dependence, unspecified, uncomplicated: Secondary | ICD-10-CM | POA: Insufficient documentation

## 2024-07-10 DIAGNOSIS — R569 Unspecified convulsions: Secondary | ICD-10-CM | POA: Diagnosis not present

## 2024-07-10 DIAGNOSIS — F419 Anxiety disorder, unspecified: Secondary | ICD-10-CM | POA: Insufficient documentation

## 2024-07-10 DIAGNOSIS — A630 Anogenital (venereal) warts: Secondary | ICD-10-CM | POA: Diagnosis not present

## 2024-07-10 DIAGNOSIS — K629 Disease of anus and rectum, unspecified: Secondary | ICD-10-CM

## 2024-07-10 DIAGNOSIS — R85610 Atypical squamous cells of undetermined significance on cytologic smear of anus (ASC-US): Secondary | ICD-10-CM | POA: Diagnosis present

## 2024-07-10 DIAGNOSIS — J45909 Unspecified asthma, uncomplicated: Secondary | ICD-10-CM | POA: Diagnosis not present

## 2024-07-10 DIAGNOSIS — Z21 Asymptomatic human immunodeficiency virus [HIV] infection status: Secondary | ICD-10-CM | POA: Insufficient documentation

## 2024-07-10 DIAGNOSIS — F319 Bipolar disorder, unspecified: Secondary | ICD-10-CM | POA: Insufficient documentation

## 2024-07-10 DIAGNOSIS — Z01818 Encounter for other preprocedural examination: Secondary | ICD-10-CM

## 2024-07-10 HISTORY — PX: HIGH RESOLUTION ANOSCOPY: SHX6345

## 2024-07-10 SURGERY — ANOSCOPY, HIGH RESOLUTION
Anesthesia: Monitor Anesthesia Care

## 2024-07-10 MED ORDER — ACETIC ACID 5 % SOLN
Status: DC | PRN
  Administered 2024-07-10: 2 via TOPICAL

## 2024-07-10 MED ORDER — BUPIVACAINE-EPINEPHRINE 0.5% -1:200000 IJ SOLN
INTRAMUSCULAR | Status: DC | PRN
  Administered 2024-07-10: 18 mL

## 2024-07-10 MED ORDER — PROPOFOL 500 MG/50ML IV EMUL
INTRAVENOUS | Status: DC | PRN
  Administered 2024-07-10: 200 ug/kg/min via INTRAVENOUS

## 2024-07-10 MED ORDER — DROPERIDOL 2.5 MG/ML IJ SOLN: 0.6250 mg | Freq: Once | INTRAMUSCULAR | Status: DC | PRN

## 2024-07-10 MED ORDER — ACETAMINOPHEN 10 MG/ML IV SOLN: 1000.0000 mg | Freq: Once | INTRAVENOUS | Status: DC | PRN

## 2024-07-10 MED ORDER — MIDAZOLAM HCL 2 MG/2ML IJ SOLN
INTRAMUSCULAR | Status: AC
  Filled 2024-07-10: qty 2

## 2024-07-10 MED ORDER — FENTANYL CITRATE (PF) 100 MCG/2ML IJ SOLN
INTRAMUSCULAR | Status: DC | PRN
  Administered 2024-07-10: 50 ug via INTRAVENOUS

## 2024-07-10 MED ORDER — FENTANYL CITRATE (PF) 100 MCG/2ML IJ SOLN
25.0000 ug | INTRAMUSCULAR | Status: DC | PRN
  Administered 2024-07-10 (×4): 50 ug via INTRAVENOUS

## 2024-07-10 MED ORDER — ACETAMINOPHEN 500 MG PO TABS
ORAL_TABLET | ORAL | Status: AC
  Filled 2024-07-10: qty 2

## 2024-07-10 MED ORDER — PROPOFOL 500 MG/50ML IV EMUL
INTRAVENOUS | Status: AC
  Filled 2024-07-10: qty 50

## 2024-07-10 MED ORDER — 0.9 % SODIUM CHLORIDE (POUR BTL) OPTIME
TOPICAL | Status: DC | PRN
  Administered 2024-07-10: 1000 mL

## 2024-07-10 MED ORDER — FENTANYL CITRATE (PF) 100 MCG/2ML IJ SOLN
INTRAMUSCULAR | Status: AC
Start: 2024-07-10 — End: 2024-07-10
  Filled 2024-07-10: qty 2

## 2024-07-10 MED ORDER — SODIUM CHLORIDE 0.9% FLUSH: 3.0000 mL | Freq: Two times a day (BID) | INTRAVENOUS | Status: DC

## 2024-07-10 MED ORDER — OXYCODONE HCL 5 MG PO TABS
5.0000 mg | ORAL_TABLET | ORAL | 0 refills | Status: AC | PRN
Start: 2024-07-10 — End: ?

## 2024-07-10 MED ORDER — KETAMINE HCL 50 MG/5ML IJ SOSY
PREFILLED_SYRINGE | INTRAMUSCULAR | Status: AC
  Filled 2024-07-10: qty 5

## 2024-07-10 MED ORDER — ACETAMINOPHEN 500 MG PO TABS: 1000.0000 mg | ORAL_TABLET | ORAL | Status: DC

## 2024-07-10 MED ORDER — MIDAZOLAM HCL 2 MG/2ML IJ SOLN
INTRAMUSCULAR | Status: DC | PRN
  Administered 2024-07-10: 1 mg via INTRAVENOUS

## 2024-07-10 MED ORDER — OXYCODONE HCL 5 MG/5ML PO SOLN: 5.0000 mg | Freq: Once | ORAL | Status: DC | PRN

## 2024-07-10 MED ORDER — PROPOFOL 10 MG/ML IV BOLUS
INTRAVENOUS | Status: DC | PRN
Start: 2024-07-10 — End: 2024-07-10
  Administered 2024-07-10: 50 mg via INTRAVENOUS

## 2024-07-10 MED ORDER — BUPIVACAINE LIPOSOME 1.3 % IJ SUSP
INTRAMUSCULAR | Status: AC
  Filled 2024-07-10: qty 20

## 2024-07-10 MED ORDER — CEFAZOLIN SODIUM-DEXTROSE 2-4 GM/100ML-% IV SOLN
INTRAVENOUS | Status: AC
  Filled 2024-07-10: qty 100

## 2024-07-10 MED ORDER — FENTANYL CITRATE (PF) 100 MCG/2ML IJ SOLN
INTRAMUSCULAR | Status: AC
  Filled 2024-07-10: qty 2

## 2024-07-10 MED ORDER — KETAMINE HCL 50 MG/ML IJ SOLN
INTRAMUSCULAR | Status: DC | PRN
  Administered 2024-07-10: 50 mg via INTRAMUSCULAR

## 2024-07-10 MED ORDER — GLYCOPYRROLATE PF 0.2 MG/ML IJ SOSY
PREFILLED_SYRINGE | INTRAMUSCULAR | Status: AC
  Filled 2024-07-10: qty 1

## 2024-07-10 MED ORDER — LACTATED RINGERS IV SOLN: INTRAVENOUS | Status: DC

## 2024-07-10 MED ORDER — OXYCODONE HCL 5 MG PO TABS: 5.0000 mg | ORAL_TABLET | Freq: Once | ORAL | Status: DC | PRN

## 2024-07-10 SURGICAL SUPPLY — 34 items
BENZOIN TINCTURE PRP APPL 2/3 (GAUZE/BANDAGES/DRESSINGS) ×1 IMPLANT
BLADE SURG 10 STRL SS (BLADE) IMPLANT
BRIEF MESH DISP 2XL (UNDERPADS AND DIAPERS) ×1 IMPLANT
COVER BACK TABLE 60X90IN (DRAPES) ×1 IMPLANT
DRAPE HYSTEROSCOPY (MISCELLANEOUS) IMPLANT
DRAPE LAPAROTOMY 100X72 PEDS (DRAPES) ×1 IMPLANT
DRAPE UTILITY XL STRL (DRAPES) ×1 IMPLANT
ELECTRODE REM PT RTRN 9FT ADLT (ELECTROSURGICAL) ×1 IMPLANT
GAUZE 4X4 16PLY ~~LOC~~+RFID DBL (SPONGE) ×1 IMPLANT
GAUZE PAD ABD 8X10 STRL (GAUZE/BANDAGES/DRESSINGS) ×1 IMPLANT
GAUZE SPONGE 4X4 12PLY STRL (GAUZE/BANDAGES/DRESSINGS) IMPLANT
GLOVE BIO SURGEON STRL SZ 6.5 (GLOVE) ×1 IMPLANT
GLOVE INDICATOR 6.5 STRL GRN (GLOVE) ×1 IMPLANT
KIT SIGMOIDOSCOPE (SET/KITS/TRAYS/PACK) IMPLANT
NDL BIOPSY 14X6 SOFT TISS (NEEDLE) IMPLANT
NDL HYPO 22X1.5 SAFETY MO (MISCELLANEOUS) ×1 IMPLANT
NEEDLE BIOPSY 14X6 SOFT TISS (NEEDLE) IMPLANT
NEEDLE HYPO 22X1.5 SAFETY MO (MISCELLANEOUS) ×1 IMPLANT
NS IRRIG 1000ML POUR BTL (IV SOLUTION) ×1 IMPLANT
PACK BASIN DAY SURGERY FS (CUSTOM PROCEDURE TRAY) ×1 IMPLANT
PAD ARMBOARD POSITIONER FOAM (MISCELLANEOUS) IMPLANT
PENCIL SMOKE EVACUATOR (MISCELLANEOUS) ×1 IMPLANT
SLEEVE SCD COMPRESS KNEE MED (STOCKING) ×1 IMPLANT
SPIKE FLUID TRANSFER (MISCELLANEOUS) ×1 IMPLANT
SPONGE SURGIFOAM ABS GEL 12-7 (HEMOSTASIS) IMPLANT
SUT CHROMIC 2 0 SH (SUTURE) IMPLANT
SUT CHROMIC 3 0 SH 27 (SUTURE) IMPLANT
SUT VIC AB 2-0 PS2 27 (SUTURE) IMPLANT
SYR BULB IRRIG 60ML STRL (SYRINGE) ×1 IMPLANT
SYR CONTROL 10ML LL (SYRINGE) ×1 IMPLANT
TOWEL GREEN STERILE FF (TOWEL DISPOSABLE) ×1 IMPLANT
TRAY DSU PREP LF (CUSTOM PROCEDURE TRAY) ×1 IMPLANT
TUBE CONNECTING 20X1/4 (TUBING) ×1 IMPLANT
YANKAUER SUCT BULB TIP NO VENT (SUCTIONS) IMPLANT

## 2024-07-10 NOTE — Anesthesia Procedure Notes (Signed)
 Procedure Name: MAC Date/Time: 07/10/2024 9:28 AM  Performed by: Delayne Olam BIRCH, CRNAPre-anesthesia Checklist: Patient identified, Emergency Drugs available, Suction available and Patient being monitored Oxygen Delivery Method: Simple face mask

## 2024-07-10 NOTE — Op Note (Signed)
 07/10/2024  9:56 AM  PATIENT:  Randy Castillo  43 y.o. male  Patient Care Team: Corrington, Coye LABOR, MD as PCP - General (Family Medicine) Eben, Reyes BROCKS, MD as Consulting Physician (Infectious Diseases) Fields, Margo CROME, MD (Inactive) as Consulting Physician (Gastroenterology)  PRE-OPERATIVE DIAGNOSIS:  ASCUS  POST-OPERATIVE DIAGNOSIS:  AIN  PROCEDURE:  HIGH RESOLUTION ANOSCOPY with BIOPSY and ABLATION    Surgeon(s): Debby Hila, MD  ASSISTANT: none   ANESTHESIA:   local and MAC  SPECIMEN:  Source of Specimen:  posterior anal canal lesions  DISPOSITION OF SPECIMEN:  PATHOLOGY  COUNTS:  YES  PLAN OF CARE: Discharge to home after PACU  PATIENT DISPOSITION:  PACU - hemodynamically stable.  INDICATION: 43 y.o. M with ASCUS on anal Pap test   OR FINDINGS: 2 posterior anal canal lesions distal to the dentate line  DESCRIPTION: the patient was identified in the preoperative holding area and taken to the OR where they were laid on the operating room table.  MAC anesthesia was induced without difficulty. The patient was then positioned in prone jackknife position with buttocks gently taped apart.  The patient was then prepped and draped in usual sterile fashion.  SCDs were noted to be in place prior to the initiation of anesthesia. A surgical timeout was performed indicating the correct patient, procedure, positioning and need for preoperative antibiotics.  A localized rectal block was performed using Marcaine  with epinephrine .    I began with a digital rectal exam.  The anal canal was gently dilated and an acetic acid  soaked sponge was placed inside and allowed to sit for approximately 2 minutes.  I then placed an anoscope into the anal canal and evaluated this completely with the colposcope.  No external lesions were noted.  2 acetowhite lesions were noted at posterior midline internally distal to the dentate line.  Biopsies were taken of each (one pedunculated and one  flat).  This was sent to pathology and the remain area was ablated with cautery.  The sites were then closed with interrupted 3-0 Chromic sutures.  A dressing was applied.  The patient was awakend from anesthesia and sent to the PACU in stable condition.  All counts were correct per OR staff.  Hila BROCKS Debby, MD  Colorectal and General Surgery [facility]

## 2024-07-10 NOTE — Transfer of Care (Signed)
 Immediate Anesthesia Transfer of Care Note  Patient: Meshilem H Nocera  Procedure(s) Performed: Procedure(s) (LRB): ANOSCOPY, HIGH RESOLUTION (N/A)  Patient Location: PACU  Anesthesia Type: MAC  Level of Consciousness: awake, alert , oriented and patient cooperative  Airway & Oxygen Therapy: Patient Spontanous Breathing and Patient connected to face mask oxygen  Post-op Assessment: Report given to PACU RN and Post -op Vital signs reviewed and stable  Post vital signs: Reviewed and stable  Complications: No apparent anesthesia complications Last Vitals:  Vitals Value Taken Time  BP 117/81 07/10/24 10:04  Temp    Pulse 96 07/10/24 10:06  Resp 13 07/10/24 10:06  SpO2 96 % 07/10/24 10:06  Vitals shown include unfiled device data.  Last Pain:  Vitals:   07/10/24 0800  TempSrc: Temporal  PainSc: 0-No pain         Complications: No notable events documented.

## 2024-07-10 NOTE — Anesthesia Postprocedure Evaluation (Signed)
 Anesthesia Post Note  Patient: Randy Castillo  Procedure(s) Performed: ANOSCOPY, HIGH RESOLUTION     Patient location during evaluation: PACU Anesthesia Type: MAC Level of consciousness: awake and alert Pain management: pain level controlled Vital Signs Assessment: post-procedure vital signs reviewed and stable Respiratory status: spontaneous breathing, nonlabored ventilation, respiratory function stable and patient connected to nasal cannula oxygen Cardiovascular status: stable and blood pressure returned to baseline Postop Assessment: no apparent nausea or vomiting Anesthetic complications: no   No notable events documented.  Last Vitals:  Vitals:   07/10/24 1030 07/10/24 1045  BP: (!) 106/90 (!) 127/91  Pulse: 78   Resp: 12 16  Temp:  (!) 25.6 C  SpO2: 98% 97%    Last Pain:  Vitals:   07/10/24 1045  TempSrc:   PainSc: 3                  [name]

## 2024-07-10 NOTE — Anesthesia Preprocedure Evaluation (Addendum)
 Anesthesia Evaluation  Patient identified by MRN, date of birth, ID band Patient awake    Reviewed: Allergy & Precautions, NPO status , Patient's Chart, lab work & pertinent test results  Airway Mallampati: I  TM Distance: >3 FB Neck ROM: Full    Dental  (+) Teeth Intact, Dental Advisory Given   Pulmonary asthma , Current Smoker   breath sounds clear to auscultation       Cardiovascular negative cardio ROS  Rhythm:Regular Rate:Normal     Neuro/Psych  Headaches, Seizures -,  PSYCHIATRIC DISORDERS Anxiety  Bipolar Disorder      GI/Hepatic negative GI ROS, Neg liver ROS,,,  Endo/Other  negative endocrine ROS    Renal/GU negative Renal ROS     Musculoskeletal negative musculoskeletal ROS (+)    Abdominal   Peds  Hematology  (+) HIV  Anesthesia Other Findings   Reproductive/Obstetrics                              Anesthesia Physical Anesthesia Plan  ASA: 2  Anesthesia Plan: MAC   Post-op Pain Management: Toradol  IV (intra-op)*   Induction: Intravenous  PONV Risk Score and Plan: 1 and Ondansetron , Midazolam  and Propofol  infusion  Airway Management Planned: Natural Airway and Nasal Cannula  Additional Equipment: None  Intra-op Plan:   Post-operative Plan:   Informed Consent: I have reviewed the patients History and Physical, chart, labs and discussed the procedure including the risks, benefits and alternatives for the proposed anesthesia with the patient or authorized representative who has indicated his/her understanding and acceptance.       Plan Discussed with: CRNA  Anesthesia Plan Comments:          Anesthesia Quick Evaluation

## 2024-07-10 NOTE — Discharge Instructions (Addendum)

## 2024-07-10 NOTE — H&P (Signed)
 REFERRING PHYSICIAN:  Dam, Jomarie Castillo   PROVIDER:  Blakelyn Castillo Randy Kitana Gage, MD     Subjective    Chief Complaint: New Consultation       History of Present Illness: Randy Castillo is a 43 y.o. male who is seen today as an office consultation at the request of Dr. Kathie for evaluation of New Consultation .   Pt with well controlled HIV.  Recent Pap test shows ASCUS     Review of Systems: A complete review of systems was obtained from the patient.  I have reviewed this information and discussed as appropriate with the patient.  See HPI as well for other ROS.     Medical History:     Past Medical History:  Diagnosis Date   HIV -AIDS with opportunistic infection, Symptomatic (CMS/HHS-HCC)        There is no problem list on file for this patient.     History reviewed. No pertinent surgical history.    No Known Allergies   No current outpatient medications on file prior to visit.    No current facility-administered medications on file prior to visit.      History reviewed. No pertinent family history.    Social History       Tobacco Use  Smoking Status Not on file  Smokeless Tobacco Not on file      Social History        Socioeconomic History   Marital status: Unknown    Social Drivers of Health        Financial Resource Strain: Medium Risk (12/23/2023)    Received from [facility]    Overall Financial Resource Strain (CARDIA)     Difficulty of Paying Living Expenses: Somewhat hard  Food Insecurity: Food Insecurity Present (12/23/2023)    Received from [facility]    Hunger Vital Sign     Within the past 12 months, you worried that your food would run out before you got the money to buy more.: Sometimes true     Within the past 12 months, the food you bought just didn't last and you didn't have money to get more.: Sometimes true  Transportation Needs: No Transportation Needs (12/23/2023)    Received from [facility] - Transportation      Lack of Transportation (Medical): No     Lack of Transportation (Non-Medical): No  Physical Activity: Insufficiently Active (12/23/2023)    Received from [facility]    Exercise Vital Sign     On average, how many days per week do you engage in moderate to strenuous exercise (like a brisk walk)?: 2 days     On average, how many minutes do you engage in exercise at this level?: 10 min  Stress: Stress Concern Present (12/23/2023)    Received from [facility] - Occupational Stress Questionnaire     Feeling of Stress : Very much  Social Connections: Somewhat Isolated (12/23/2023)    Received from [facility]    Social Network     How would you rate your social network (family, work, friends)?: Restricted participation with some degree of social isolation  Housing Stability: Low Risk  (12/23/2023)    Received from [facility] Stability Vital Sign     In the last 12 months, was there a time when you were not able to pay the mortgage or rent on time?: No  In the past 12 months, how many times have you moved where you were living?: 0     At any time in the past 12 months, were you homeless or living in a shelter (including now)?: No      Objective:      Vitals:   07/10/24 0800  BP: 110/81  Pulse: 85  Resp: 16  Temp: 99.3 F (37.4 C)  SpO2: 98%      Exam Gen: NAD CV: RRR Pulm: CTA Abd: soft     Labs, Imaging and Diagnostic Testing: HIV <20 CD4 920   Cytology: ASCUS, positive HR HPV   Assessment and Plan:  Diagnoses and all orders for this visit:   Pap smear of anus with ASCUS    I have recommended HRA with possible biopsy and ablation.  We discussed risks of bleeding, pain and recurrence.  All questions were answered.  Pt would like to proceed as soon as possible   Randy JAYSON Ned, MD Colon and Rectal Surgery [facility]

## 2024-07-11 ENCOUNTER — Encounter (HOSPITAL_BASED_OUTPATIENT_CLINIC_OR_DEPARTMENT_OTHER): Payer: Self-pay | Admitting: General Surgery

## 2024-07-11 LAB — SURGICAL PATHOLOGY

## 2024-07-15 ENCOUNTER — Other Ambulatory Visit: Payer: Self-pay | Admitting: Infectious Disease

## 2024-07-15 DIAGNOSIS — B20 Human immunodeficiency virus [HIV] disease: Secondary | ICD-10-CM

## 2024-07-15 DIAGNOSIS — E785 Hyperlipidemia, unspecified: Secondary | ICD-10-CM

## 2024-07-15 NOTE — Telephone Encounter (Signed)
 100 day supply sent per patient's insurance preference.

## 2024-07-21 ENCOUNTER — Ambulatory Visit (INDEPENDENT_AMBULATORY_CARE_PROVIDER_SITE_OTHER): Admitting: Neurology

## 2024-07-21 ENCOUNTER — Encounter: Payer: Self-pay | Admitting: Neurology

## 2024-07-21 VITALS — BP 118/71 | HR 94 | Wt 173.4 lb

## 2024-07-21 DIAGNOSIS — G43719 Chronic migraine without aura, intractable, without status migrainosus: Secondary | ICD-10-CM

## 2024-07-21 MED ORDER — ONABOTULINUMTOXINA 200 UNITS IJ SOLR
155.0000 [IU] | Freq: Once | INTRAMUSCULAR | Status: AC
  Administered 2024-07-21 (×2): 155 [IU] via INTRAMUSCULAR

## 2024-07-21 NOTE — Patient Instructions (Signed)
Please remember, botulinum toxin takes about 3-7 days to kick in. As discussed, this is not a pain shot. The purpose of the injections is to gradually improve your symptoms. In some patients it takes up to 2-3 weeks to make a difference and it wears off with time. Sometimes it may wear off before it is time for the next injection. We still should wait till the next 3 monthly injection, because injecting too frequently may cause you to develop immunity to the botulinum toxin. We are looking for a reduction in the severity and/or frequency of your symptoms. As a reminder, side effects to look out for are (but not limited to): mouth dryness, dryness of the eyes, heaviness of your head or muscle weakness, including droopy face or droopy eyelid(s), rarely: speech or swallowing difficulties and very rarely: breathing difficulties. Some people have transient neck pain or soreness which typically responds to over-the-counter anti-inflammatory medication and local heat application with a heat pad. If you think you have a severe reaction to the botulinum toxin, such as weakness, trouble speaking, trouble breathing, or trouble swallowing, you have to call 911 or have someone take you to the nearest emergency room. However, most people have either no or minimal side effects from the injections. It is normal to have a little bit of redness and swelling around the injection sites which usually improves after a few hours. Rarely, there may be a bruise that improves on its own. Most side effects reported are very mild and resolve within 10-14 days. Please feel free to call us if you have any additional questions or concerns: [phone] or email us through My Chart. We may have to adjust the dose over time, depending on your results from this injection and your overall response over time to this medication.   

## 2024-07-21 NOTE — Progress Notes (Signed)
 Subjective:    Patient ID: Randy Castillo is a 43 y.o. male.  HPI  Randy Castillo is a 43 year old male with an underlying complex medical history of hypertension, hyperlipidemia, low testosterone , anxiety, mood disorder, chronic pain, on chronic narcotic pain medication, PTSD (per chart review), allergies, HIV disease (followed by ID), history of hepatitis C, seizures (per chart review), and migraine headaches, who presents for his 6 botulinum toxin injection for intractable migraine headaches.  The patient is accompanied by his son today.   He had his 5th botulinum toxin injection on 04/23/2024, at which time he reported doing well.  He had no side effects from the injection.  It was still effective.     Today, 07/21/2024: He reports doing well with the injections, no recent issues.        Written informed consent for recurrent, 3 monthly intramuscular injections with botulinum toxin for this indication has been obtained and will be scanned into the patient's electronic chart.   I talked to the patient in detail about expectations, limitations, benefits as well as potential adverse effects of botulinum toxin injections. The patient understands that the side effects include (but are not limited to): Mouth dryness, dryness of eyes, speech and swallowing difficulties, respiratory depression or problems breathing, weakness of muscles including more distant muscles than the ones injected, flu-like symptoms, myalgias, injection site reactions such as redness, itching, swelling, pain, and infection. He has no history of neuromuscular disease such as Lambert-Eaton syndrome or myasthenia gravis.   200 units of botulinum toxin type A  in the form of Botox  were reconstituted using preservative-free normal saline to a concentration of 10 units per 0.1 mL and drawn up in two 1 mL tuberculin syringes. Lot number and expiration dates as below.   O/E: BP 118/71 (Cuff Size: Normal)   Pulse 94   Wt 173 lb 6.4  oz (78.7 kg)   BMI 24.18 kg/m       The patient was situated in a chair, sitting comfortably. After preparing the areas with 70% isopropyl alcohol and using a 26 gauge 1 1/2 inch hollow lumen recording EMG needle for the neck injections as well as a 30 gauge 1 inch needle for the facial injections, a total dose of 155 units of botulinum toxin type A  in the form of Botox  was injected into the muscles and the following distribution and quantities:   #1: 10 units on the right and 10 units in the left frontalis muscles, broken down in 2 sites on each side. #2: 5 units in the right and 5 units in the left corrugator muscles. #3: 15 units in the right and 15 units in the left occipitalis muscles, broken down in 3 sites on each side. #4: 20 units in the right and 20 units in the left temporalis muscles, broken down in 4 sites on each side.  #5: 15 units on the right and 15 units in the left upper trapezius muscles, broken down in 3 sites on each side. #6: 10 units in the right and 10 units in the left splenius capitis muscles, broken down in 2 sites on each side.  #7: 2.5 units in the right and 2.5 units in the left procerus muscles.   EMG guidance was utilized for the neck injections with mild to moderate EMG activity noted in the bilateral upper splenius capitis areas and upper trapezius area bilaterally.     A dose of 45 units out of a total dose of 200 units was  discarded as unavoidable waste.     Review of Systems  Neurological:           Botox - 200 units x 1 vial Lot: [id number] Expiration: 10/2026 NDC: 9976-6078-97   Bacteriostatic 0.9% Sodium Chloride - 2 mL  Lot: [id number] Expiration: 10-10-2025 NDC: 9095-8033-97   Dx: G18.719 B/B Witnessed by Rolande Edison, CMA    The patient tolerated the procedure well without immediate complications. He was advised to make a follow-up appointment for repeat injections in 3 months from now and encouraged to call us  with any interim questions,  concerns, problems, or updates.  He was also advised to keep his follow-up appointment with his neurologist for ongoing medical management of his migraines as he has prescription medications through that office.  The hope is that when the Botox  kicks in and is continually helpful on a longer-term basis, that he can be tapered off of some of his medications over time.  He was in agreement and did not have any questions prior to leaving clinic today.      Previously (copied from previous notes for reference):   He had his 4th injection on 01/29/2024, at which time he reported ongoing good results with Botox  injections without any side effects.  He had noticed breakthrough headaches in the couple of weeks being helped to the injection.    He had his 3rd injection on 10/22/2023, at which time he reported doing well with the injections he had no side effects and good effect from the injection.   He had his second injection on 07/18/23, at which time he reported doing well. Botox  had been very helpful, no side effect were reported    He had his first Botulinum toxin injection on 04/11/2023, at which time he received 155 units of Botox .    I first met him at the request of his neurologist on 03/05/2023, at which time the patient reported chronic migraines.  His neurologist does not perform Botox  injections.  The patient was advised to continue with his medical management and follow-up through his existing neurologist and we proceeded with Botox  authorization for intractable migraines.   He has had migraine headaches for years and has tried and failed multiple abortive and preventive medications.      03/05/23: (He) reports a longstanding history of migraine headaches of about 10 years.  Triggers include not eating on a regular basis or not drinking enough water he admits.  Symptoms are associated with nausea and sometimes vomiting, photophobia, headaches are often in the neck area and bitemporal areas and may  last up to 4 days.  He reports that most of the month he has a migraine headache or headache.  He takes over-the-counter ibuprofen, 800 mg as needed and Maxalt  has been helpful per PCP prescription and he is also on Aimovig  injections per PCP.  He has not yet started Ajovy injections as prescribed by you, he reports that he has not been contacted that the prior authorization has been approved.  He is encouraged to check with his pharmacy.  He is unable to provide a list of medications he has tried and failed for migraine prevention or acute migraine management.  He reports that his PCP has prescribed the Aimovig  and Maxalt .  It helps to go to bed and sleep in a dark room, sometimes taking Xanax  helps as well.  He drinks caffeine in the form of coffee, 2 cups/day, 1 glass of tea, usually no daily soda, he admits to drinking only 2  to 3 glasses of water per day.  He does not drink any alcohol.  He is working on smoking cessation and currently smokes about 4 to 5 cigarettes/day. I reviewed your office note from 02/07/2023.  He has been on Aimovig  injections as well as Maxalt  as needed.  Of note, he is on multiple high risk medications as well as psychotropic medications, he was interested in pursuing Botox  injections for his migraines.  He was referred for evaluation of Botox  injections for chronic migraines.  He was advised to stop Aimovig  and start Ajovy at the time.    He recent eye examination about 3 weeks ago, he was given new contact lenses.  He works part-time in Aeronautical [employer].   He had previously also seen Dr. Havery Rumble in neurology for low back pain and fibromyalgia.  He had seen Dr. Damien Bull in February 2020 for migraine headaches and episode of shaking.         Review of Systems  Neurological:           Botox - 200 units x 1 vial Lot: [id number] Expiration: 10/2026 NDC: 9976-6078-97   Bacteriostatic 0.9% Sodium Chloride - 2 mL  Lot: [id number] Expiration: 10-10-2025 NDC: 9095-8033-97    Dx: G43.719 B/B Witnessed by Rolande Edison, CMA

## 2024-09-18 NOTE — Telephone Encounter (Signed)
 Submitted auth request to transition pt to [facility], status is pending. Key: [id number]

## 2024-09-22 MED ORDER — ONABOTULINUMTOXINA 200 UNITS IJ SOLR: INTRAMUSCULAR | 3 refills | Status: AC

## 2024-09-22 NOTE — Telephone Encounter (Signed)
 Botox  200 unit Rx sent to [facility] per v.o. Dr Buck for chronic migraine G43.719.

## 2024-09-22 NOTE — Addendum Note (Signed)
 Addended by: HILLIARD HEATHER CROME on: 09/22/2024 02:35 PM   Modules accepted: Orders

## 2024-09-22 NOTE — Telephone Encounter (Signed)
 Received approval, please send rx to [facility].  Auth#: [id number] (09/18/24-12/19/24)

## 2024-10-03 ENCOUNTER — Telehealth: Payer: Self-pay | Admitting: Neurology

## 2024-10-03 NOTE — Telephone Encounter (Signed)
 [facility]. Nila, Schedule delivery of botulinum toxin Type A  (BOTOX ) 200 units injection on Tuesday, 10/07/24.

## 2024-10-06 ENCOUNTER — Telehealth: Payer: Self-pay | Admitting: Neurology

## 2024-10-06 NOTE — Telephone Encounter (Signed)
 [facility] called to schedule  Pt Botox  Delivery   Botox  Delivery  Date: 10-08-2024 Quantity of 1 Valve 200 units for 84 day supply's  Seg Req

## 2024-10-16 ENCOUNTER — Ambulatory Visit (INDEPENDENT_AMBULATORY_CARE_PROVIDER_SITE_OTHER): Admitting: Neurology

## 2024-10-16 VITALS — BP 109/76 | HR 74

## 2024-10-16 DIAGNOSIS — G43719 Chronic migraine without aura, intractable, without status migrainosus: Secondary | ICD-10-CM | POA: Diagnosis not present

## 2024-10-16 MED ORDER — ONABOTULINUMTOXINA 200 UNITS IJ SOLR
155.0000 [IU] | Freq: Once | INTRAMUSCULAR | Status: AC
  Administered 2024-10-16: 155 [IU] via INTRAMUSCULAR

## 2024-10-16 NOTE — Progress Notes (Signed)
 [name] is a 43 year old male with an underlying complex medical history of hypertension, hyperlipidemia, low testosterone , anxiety, mood disorder, chronic pain, on chronic narcotic pain medication, PTSD (per chart review), allergies, HIV disease (followed by ID), history of hepatitis C, seizures (per chart review), and migraine headaches, who presents for his 7th botulinum toxin injection for intractable migraine headaches.  The patient is accompanied by his son today.   He had his sixth injection on 07/21/2024, at which time he reported doing well.  He had no recent exacerbation of his migraines and was tolerating the injections without any obvious side effects.  Today, 10/16/2024: He reports doing well, had 1 recent milder migraine for which he took ibuprofen over-the-counter.  He still finds Botox  very effective and has had no side effects.      Written informed consent for recurrent, 3 monthly intramuscular injections with botulinum toxin for this indication has been obtained and will be scanned into the patient's electronic chart.   I talked to the patient in detail about expectations, limitations, benefits as well as potential adverse effects of botulinum toxin injections. The patient understands that the side effects include (but are not limited to): Mouth dryness, dryness of eyes, speech and swallowing difficulties, respiratory depression or problems breathing, weakness of muscles including more distant muscles than the ones injected, flu-like symptoms, myalgias, injection site reactions such as redness, itching, swelling, pain, and infection. He has no history of neuromuscular disease such as Lambert-Eaton syndrome or myasthenia gravis.   200 units of botulinum toxin type A  in the form of Botox  were reconstituted using preservative-free normal saline to a concentration of 10 units per 0.1 mL and drawn up in two 1 mL tuberculin syringes. Lot number and expiration dates as below.   O/E: BP  109/76   Pulse 74       The patient was situated in a chair, sitting comfortably. After preparing the areas with 70% isopropyl alcohol and using a 26 gauge 1 1/2 inch hollow lumen recording EMG needle for the neck injections as well as a 30 gauge 1 inch needle for the facial injections, a total dose of 155 units of botulinum toxin type A  in the form of Botox  was injected into the muscles and the following distribution and quantities:   #1: 10 units on the right and 10 units in the left frontalis muscles, broken down in 2 sites on each side. #2: 5 units in the right and 5 units in the left corrugator muscles. #3: 15 units in the right and 15 units in the left occipitalis muscles, broken down in 3 sites on each side. #4: 20 units in the right and 20 units in the left temporalis muscles, broken down in 4 sites on each side.  #5: 15 units on the right and 15 units in the left upper trapezius muscles, broken down in 3 sites on each side. #6: 10 units in the right and 10 units in the left splenius capitis muscles, broken down in 2 sites on each side.  #7: 2.5 units in the right and 2.5 units in the left procerus muscles.   EMG guidance was utilized for the neck injections with mild to moderate EMG activity noted in the bilateral upper splenius capitis areas and upper trapezius area bilaterally.     A dose of 45 units out of a total dose of 200 units was discarded as unavoidable waste.       Botox - 200 units x 1 vial Lot: [id number]  Expiration: 05/2025 NDC: 9976-6078-97   Bacteriostatic 0.9% Sodium Chloride - 4 mL  Lot: [id number] Expiration: 09/2025  NDC: 9590-8033-97   Dx: H56.280 S/P  Witnessed by Heather PEAK   The patient tolerated the procedure well without immediate complications. He was advised to make a follow-up appointment for repeat injections in 3 months from now and encouraged to call us  with any interim questions, concerns, problems, or updates.  He was also advised to keep his  follow-up appointment with his neurologist for ongoing medical management of his migraines as he has prescription medications through that office.  The hope is that when the Botox  kicks in and is continually helpful on a longer-term basis, that he can be tapered off of some of his medications over time.  He was in agreement and did not have any questions prior to leaving clinic today.       Previously (copied from previous notes for reference):    He had his 5th botulinum toxin injection on 04/23/2024, at which time he reported doing well.  He had no side effects from the injection.  It was still effective.   He had his 4th injection on 01/29/2024, at which time he reported ongoing good results with Botox  injections without any side effects.  He had noticed breakthrough headaches in the couple of weeks being helped to the injection.    He had his 3rd injection on 10/22/2023, at which time he reported doing well with the injections he had no side effects and good effect from the injection.   He had his second injection on 07/18/23, at which time he reported doing well. Botox  had been very helpful, no side effect were reported    He had his first Botulinum toxin injection on 04/11/2023, at which time he received 155 units of Botox .    I first met him at the request of his neurologist on 03/05/2023, at which time the patient reported chronic migraines.  His neurologist does not perform Botox  injections.  The patient was advised to continue with his medical management and follow-up through his existing neurologist and we proceeded with Botox  authorization for intractable migraines.   He has had migraine headaches for years and has tried and failed multiple abortive and preventive medications.      03/05/23: (He) reports a longstanding history of migraine headaches of about 10 years.  Triggers include not eating on a regular basis or not drinking enough water he admits.  Symptoms are associated with nausea  and sometimes vomiting, photophobia, headaches are often in the neck area and bitemporal areas and may last up to 4 days.  He reports that most of the month he has a migraine headache or headache.  He takes over-the-counter ibuprofen, 800 mg as needed and Maxalt  has been helpful per PCP prescription and he is also on Aimovig  injections per PCP.  He has not yet started Ajovy injections as prescribed by you, he reports that he has not been contacted that the prior authorization has been approved.  He is encouraged to check with his pharmacy.  He is unable to provide a list of medications he has tried and failed for migraine prevention or acute migraine management.  He reports that his PCP has prescribed the Aimovig  and Maxalt .  It helps to go to bed and sleep in a dark room, sometimes taking Xanax  helps as well.  He drinks caffeine in the form of coffee, 2 cups/day, 1 glass of tea, usually no daily soda, he admits to drinking  only 2 to 3 glasses of water per day.  He does not drink any alcohol.  He is working on smoking cessation and currently smokes about 4 to 5 cigarettes/day. I reviewed your office note from 02/07/2023.  He has been on Aimovig  injections as well as Maxalt  as needed.  Of note, he is on multiple high risk medications as well as psychotropic medications, he was interested in pursuing Botox  injections for his migraines.  He was referred for evaluation of Botox  injections for chronic migraines.  He was advised to stop Aimovig  and start Ajovy at the time.    He recent eye examination about 3 weeks ago, he was given new contact lenses.  He works part-time in aeronautical engineer.   He had previously also seen Dr. Havery Rumble in neurology for low back pain and fibromyalgia.  He had seen Dr. Damien Bull in February 2020 for migraine headaches and episode of shaking.

## 2024-12-09 ENCOUNTER — Telehealth: Payer: Self-pay | Admitting: Neurology

## 2024-12-09 NOTE — Telephone Encounter (Signed)
 Submitted PA renewal request via CMM, status is pending. Key: [id number]

## 2024-12-18 NOTE — Telephone Encounter (Signed)
 Auth#: [id number] (12/09/24-12/10/25)

## 2025-01-05 ENCOUNTER — Other Ambulatory Visit: Payer: Self-pay | Admitting: Infectious Disease

## 2025-01-05 DIAGNOSIS — Z21 Asymptomatic human immunodeficiency virus [HIV] infection status: Secondary | ICD-10-CM

## 2025-01-08 ENCOUNTER — Ambulatory Visit (INDEPENDENT_AMBULATORY_CARE_PROVIDER_SITE_OTHER): Admitting: Neurology

## 2025-01-08 VITALS — BP 104/75 | HR 70

## 2025-01-08 DIAGNOSIS — G43719 Chronic migraine without aura, intractable, without status migrainosus: Secondary | ICD-10-CM

## 2025-01-08 MED ORDER — ONABOTULINUMTOXINA 200 UNITS IJ SOLR
155.0000 [IU] | Freq: Once | INTRAMUSCULAR | Status: AC
  Administered 2025-01-08: 155 [IU] via INTRAMUSCULAR

## 2025-01-08 NOTE — Progress Notes (Signed)
 [name] is a 44 year old male with an underlying complex medical history of hypertension, hyperlipidemia, low testosterone, anxiety, mood disorder, chronic pain, on chronic narcotic pain medication, PTSD (per chart review), allergies, HIV disease (followed by ID), history of hepatitis C, seizures (per chart review), and migraine headaches, who presents for his 8th botulinum toxin injection for intractable migraine headaches.  The patient is accompanied by his son today.     He had his 7th injection on 10/16/2024, at which time he reported doing rather well with Botox  injections without any obvious side effects and no need for as needed medication except for once.    Today, 01/08/2025: He reports ongoing good results with Botox .  He feels that it wears off about a week or 2 prior to his injection appointment.  He sometimes takes Maxalt and/or ibuprofen.  He has no new concerns today.   Written informed consent for recurrent, 3 monthly intramuscular injections with botulinum toxin for this indication has been obtained and will be scanned into the patient's electronic chart.   I talked to the patient in detail about expectations, limitations, benefits as well as potential adverse effects of botulinum toxin injections. The patient understands that the side effects include (but are not limited to): Mouth dryness, dryness of eyes, speech and swallowing difficulties, respiratory depression or problems breathing, weakness of muscles including more distant muscles than the ones injected, flu-like symptoms, myalgias, injection site reactions such as redness, itching, swelling, pain, and infection. He has no history of neuromuscular disease such as Lambert-Eaton syndrome or myasthenia gravis.   200 units of botulinum toxin type A  in the form of Botox  were reconstituted using preservative-free normal saline to a concentration of 10 units per 0.1 mL and drawn up in two 1 mL tuberculin syringes. Lot number and  expiration dates as below.   O/E: BP 104/75   Pulse 70      The patient was situated in a chair, sitting comfortably. After preparing the areas with 70% isopropyl alcohol and using a 26 gauge 1 1/2 inch hollow lumen recording EMG needle for the neck injections as well as a 30 gauge 1 inch needle for the facial injections, a total dose of 155 units of botulinum toxin type A  in the form of Botox  was injected into the muscles and the following distribution and quantities:   #1: 10 units on the right and 10 units in the left frontalis muscles, broken down in 2 sites on each side. #2: 5 units in the right and 5 units in the left corrugator muscles. #3: 15 units in the right and 15 units in the left occipitalis muscles, broken down in 3 sites on each side. #4: 20 units in the right and 20 units in the left temporalis muscles, broken down in 4 sites on each side.  #5: 15 units on the right and 15 units in the left upper trapezius muscles, broken down in 3 sites on each side. #6: 10 units in the right and 10 units in the left splenius capitis muscles, broken down in 2 sites on each side.  #7: 2.5 units in the right and 2.5 units in the left procerus muscles.   EMG guidance was utilized for the neck injections with mild EMG activity noted in the right splenius capitis area.     A dose of 45 units out of a total dose of 200 units was discarded as unavoidable waste.       Botox - 200 units x 1 vial  Lot: [id number] Expiration: 02/2027 NDC: 9976-6078-97   Bacteriostatic 0.9% Sodium Chloride - 2.2 mL  Lot: [id number] Expiration: 09/2025  NDC: 9590-8033-97   Dx: H56.280 S/P  Witnessed by Diandra,CMA     The patient tolerated the procedure well without immediate complications. He was advised to make a follow-up appointment for repeat injections in 3 months from now and encouraged to call us  with any interim questions, concerns, problems, or updates.  He was also advised to keep his follow-up appointment  with his neurologist for ongoing medical management of his migraines as he has prescription medications through that office.  The hope is that when the Botox  kicks in and is continually helpful on a longer-term basis, that he can be tapered off of some of his medications over time.  He was in agreement and did not have any questions prior to leaving clinic today.       Previously (copied from previous notes for reference):     He had his sixth injection on 07/21/2024, at which time he reported doing well.  He had no recent exacerbation of his migraines and was tolerating the injections without any obvious side effects.  He had his 5th botulinum toxin injection on 04/23/2024, at which time he reported doing well.  He had no side effects from the injection.  It was still effective.    He had his 4th injection on 01/29/2024, at which time he reported ongoing good results with Botox  injections without any side effects.  He had noticed breakthrough headaches in the couple of weeks being helped to the injection.    He had his 3rd injection on 10/22/2023, at which time he reported doing well with the injections he had no side effects and good effect from the injection.   He had his second injection on 07/18/23, at which time he reported doing well. Botox  had been very helpful, no side effect were reported    He had his first Botulinum toxin injection on 04/11/2023, at which time he received 155 units of Botox .    I first met him at the request of his neurologist on 03/05/2023, at which time the patient reported chronic migraines.  His neurologist does not perform Botox  injections.  The patient was advised to continue with his medical management and follow-up through his existing neurologist and we proceeded with Botox  authorization for intractable migraines.   He has had migraine headaches for years and has tried and failed multiple abortive and preventive medications.      03/05/23: (He) reports a  longstanding history of migraine headaches of about 10 years.  Triggers include not eating on a regular basis or not drinking enough water he admits.  Symptoms are associated with nausea and sometimes vomiting, photophobia, headaches are often in the neck area and bitemporal areas and may last up to 4 days.  He reports that most of the month he has a migraine headache or headache.  He takes over-the-counter ibuprofen, 800 mg as needed and Maxalt has been helpful per PCP prescription and he is also on Aimovig injections per PCP.  He has not yet started Ajovy injections as prescribed by you, he reports that he has not been contacted that the prior authorization has been approved.  He is encouraged to check with his pharmacy.  He is unable to provide a list of medications he has tried and failed for migraine prevention or acute migraine management.  He reports that his PCP has prescribed the Aimovig and Maxalt.  It  helps to go to bed and sleep in a dark room, sometimes taking Xanax  helps as well.  He drinks caffeine in the form of coffee, 2 cups/day, 1 glass of tea, usually no daily soda, he admits to drinking only 2 to 3 glasses of water per day.  He does not drink any alcohol.  He is working on smoking cessation and currently smokes about 4 to 5 cigarettes/day. I reviewed your office note from 02/07/2023.  He has been on Aimovig injections as well as Maxalt as needed.  Of note, he is on multiple high risk medications as well as psychotropic medications, he was interested in pursuing Botox  injections for his migraines.  He was referred for evaluation of Botox  injections for chronic migraines.  He was advised to stop Aimovig and start Ajovy at the time.    He recent eye examination about 3 weeks ago, he was given new contact lenses.  He works part-time in aeronautical engineer.   He had previously also seen Dr. Havery Rumble in neurology for low back pain and fibromyalgia.  He had seen Dr. Damien Bull in February 2020 for  migraine headaches and episode of shaking.

## 2025-04-02 ENCOUNTER — Ambulatory Visit: Admitting: Neurology

## 2025-04-08 ENCOUNTER — Ambulatory Visit: Admitting: Neurology
